# Patient Record
Sex: Female | Born: 1995 | Race: White | Hispanic: No | Marital: Married | Smoking: Never smoker
Health system: Southern US, Community
[De-identification: ages and names within clinical notes are randomized; demographics above are authoritative.]

## PROBLEM LIST (undated history)

## (undated) DIAGNOSIS — N920 Excessive and frequent menstruation with regular cycle: Secondary | ICD-10-CM

## (undated) DIAGNOSIS — N946 Dysmenorrhea, unspecified: Secondary | ICD-10-CM

## (undated) HISTORY — PX: NO PAST SURGERIES: SHX2092

## (undated) HISTORY — DX: Excessive and frequent menstruation with regular cycle: N92.0

## (undated) HISTORY — DX: Dysmenorrhea, unspecified: N94.6

---
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
## 2017-01-02 ENCOUNTER — Encounter: Payer: Self-pay | Admitting: Obstetrics and Gynecology

## 2017-01-02 ENCOUNTER — Ambulatory Visit (INDEPENDENT_AMBULATORY_CARE_PROVIDER_SITE_OTHER): Payer: BLUE CROSS/BLUE SHIELD | Admitting: Obstetrics and Gynecology

## 2017-01-02 VITALS — BP 111/73 | HR 80 | Ht 64.0 in | Wt 136.2 lb

## 2017-01-02 DIAGNOSIS — Z8742 Personal history of other diseases of the female genital tract: Secondary | ICD-10-CM | POA: Insufficient documentation

## 2017-01-02 DIAGNOSIS — Z3041 Encounter for surveillance of contraceptive pills: Secondary | ICD-10-CM

## 2017-01-02 DIAGNOSIS — Z842 Family history of other diseases of the genitourinary system: Secondary | ICD-10-CM

## 2017-01-02 DIAGNOSIS — N946 Dysmenorrhea, unspecified: Secondary | ICD-10-CM | POA: Insufficient documentation

## 2017-01-02 HISTORY — DX: Dysmenorrhea, unspecified: N94.6

## 2017-01-02 HISTORY — DX: Family history of other diseases of the genitourinary system: Z84.2

## 2017-01-02 HISTORY — DX: Personal history of other diseases of the female genital tract: Z87.42

## 2017-01-02 NOTE — Progress Notes (Signed)
Chief complaint: 1. Dysmenorrhea 2. Menorrhagia 3. Family history of endometriosis  The patient is an 21 year old female para 0, started on Sprintec for management of dysmenorrhea and menorrhagia, with improvement in symptomatology, presents in the company of her mom for follow-up on possible endometriosis.  Patient had sister recently diagnosed with significant endometriosis within the pelvis.  Gynecologic history: Menarche-age 21 Intervals-monthly Duration-7 days Flow-heavy Dysmenorrhea-severe 7/10; central cramps with backache and discomfort within buttocks without radiation; sitting, heating pad, and Pamprin help her symptoms. She has missed school because of her symptomatology. She has noted bowel symptoms prior to starting birth control pills perimenstrually including a defecation urge (which has resolved since starting birth control pills) Since starting birth control pills her dysmenorrhea has lessened.  Past Medical History:  Diagnosis Date  . Heavy periods   . Painful menstrual periods    Past Surgical History:  Procedure Laterality Date  . NO PAST SURGERIES      Review of systems: As noted in the history of present illness  OBJECTIVE: BP 111/73   Pulse 80   Ht 5\' 4"  (1.626 m)   Wt 136 lb 3.2 oz (61.8 kg)   LMP 12/04/2016 (Exact Date)   BMI 23.38 kg/m  Physical exam-deferred  ASSESSMENT: 1. Dysmenorrhea, improved with OCP therapy 2. Menorrhagia, improved with OCP therapy 3. Family history of endometriosis in sister  PLAN: 1. Endometriosis was discussed/explained and management options reviewed. Impact on fertility were addressed. Patient does understand that so long as she is being treated with birth control pills for endometriosis, her disease may not significantly progress. Impact on fertility is uncertain. However, there is no recommended therapeutic intervention to prevent infertility from endometriosis at this time other than treating with hormonal  contraceptives to suppress symptoms and already existing implants. 2. Patient may follow up with her pediatrician on a routine basis until she is dismissed from the pediatrics practice. She may follow up here on an as-needed basis or transition her care to [facility] as she desires.  A total of 15 minutes were spent face-to-face with the patient during this encounter and over half of that time dealt with counseling and coordination of care.  Herold HarmsMartin A Nalaysia Manganiello, MD  Note: This dictation was prepared with Dragon dictation along with smaller phrase technology. Any transcriptional errors that result from this process are unintentional.

## 2017-01-02 NOTE — Patient Instructions (Signed)
1. Continue taking birth control pills for cycle regulation, painful periods, and heavy periods 2. Continue using Pamprin or ibuprofen for cramps as needed 3. Return if pelvic pain symptoms increase 4. First Pap smear will be needed by age 21 5. May continue with annual physicals at [facility] until age 21 or transition here sooner if you desire   Endometriosis Endometriosis is a condition in which the tissue that lines the uterus (endometrium) grows outside of its normal location. The tissue may grow in many locations close to the uterus, but it commonly grows on the ovaries, fallopian tubes, vagina, or bowel. When the uterus sheds the endometrium every menstrual cycle, there is bleeding wherever the endometrial tissue is located. This can cause pain because blood is irritating to tissues that are not normally exposed to it. What are the causes? The cause of endometriosis is not known. What increases the risk? You may be more likely to develop endometriosis if you:  Have a family history of endometriosis.  Have never given birth.  Started your period at age 19 or younger.  Have high levels of estrogen in your body.  Were exposed to a certain medicine (diethylstilbestrol) before you were born (in utero).  Had low birth weight.  Were born as a twin, triplet, or other multiple.  Have a BMI of less than 25. BMI is an estimate of body fat and is calculated from height and weight. What are the signs or symptoms? Often, there are no symptoms of this condition. If you do have symptoms, they may:  Vary depending on where your endometrial tissue is growing.  Occur during your menstrual period (most common) or midcycle.  Come and go, or you may go months with no symptoms at all.  Stop with menopause. Symptoms may include:  Pain in the back or abdomen.  Heavier bleeding during periods.  Pain during sex.  Painful bowel movements.  Infertility.  Pelvic  pain.  Bleeding more than once a month. How is this diagnosed? This condition is diagnosed based on your symptoms and a physical exam. You may have tests, such as:  Blood tests and urine tests. These may be done to help rule out other possible causes of your symptoms.  Ultrasound, to look for abnormal tissues.  An X-ray of the lower bowel (barium enema).  An ultrasound that is done through the vagina (transvaginally).  CT scan.  MRI.  Laparoscopy. In this procedure, a lighted, pencil-sized instrument called a laparoscope is inserted into your abdomen through an incision. The laparoscope allows your health care provider to look at the organs inside your body and check for abnormal tissue to confirm the diagnosis. If abnormal tissue is found, your health care provider may remove a small piece of tissue (biopsy) to be examined under a microscope. How is this treated? Treatment for this condition may include:  Medicines to relieve pain, such as NSAIDs.  Hormone therapy. This involves using artificial (synthetic) hormones to reduce endometrial tissue growth. Your health care provider may recommend using a hormonal form of birth control, or other medicines.  Surgery. This may be done to remove abnormal endometrial tissue.  In some cases, tissue may be removed using a laparoscope and a laser (laparoscopic laser treatment).  In severe cases, surgery may be done to remove the fallopian tubes, uterus, and ovaries (hysterectomy). Follow these instructions at home:  Take over-the-counter and prescription medicines only as told by your health care provider.  Do not drive or use heavy  machinery while taking prescription pain medicine.  Try to avoid activities that cause pain, including sexual activity.  Keep all follow-up visits as told by your health care provider. This is important. Contact a health care provider if:  You have pain in the area between your hip bones (pelvic area) that  occurs:  Before, during, or after your period.  In between your period and gets worse during your period.  During or after sex.  With bowel movements or urination, especially during your period.  You have problems getting pregnant.  You have a fever. Get help right away if:  You have severe pain that does not get better with medicine.  You have severe nausea and vomiting, or you cannot eat without vomiting.  You have pain that affects only the lower, right side of your abdomen.  You have abdominal pain that gets worse.  You have abdominal swelling.  You have blood in your stool. This information is not intended to replace advice given to you by your health care provider. Make sure you discuss any questions you have with your health care provider. Document Released: 10/24/2000 Document Revised: 08/01/2016 Document Reviewed: 03/29/2016 Elsevier Interactive Patient Education  2017 [employer].

## 2017-06-03 DIAGNOSIS — N760 Acute vaginitis: Secondary | ICD-10-CM | POA: Diagnosis not present

## 2017-09-27 DIAGNOSIS — J069 Acute upper respiratory infection, unspecified: Secondary | ICD-10-CM | POA: Diagnosis not present

## 2017-10-20 ENCOUNTER — Ambulatory Visit (INDEPENDENT_AMBULATORY_CARE_PROVIDER_SITE_OTHER): Payer: BLUE CROSS/BLUE SHIELD | Admitting: Certified Nurse Midwife

## 2017-10-20 ENCOUNTER — Encounter: Payer: Self-pay | Admitting: Certified Nurse Midwife

## 2017-10-20 VITALS — BP 120/76 | HR 101 | Ht 63.0 in | Wt 157.4 lb

## 2017-10-20 DIAGNOSIS — Z3201 Encounter for pregnancy test, result positive: Secondary | ICD-10-CM

## 2017-10-20 DIAGNOSIS — R109 Unspecified abdominal pain: Secondary | ICD-10-CM | POA: Diagnosis not present

## 2017-10-20 DIAGNOSIS — N926 Irregular menstruation, unspecified: Secondary | ICD-10-CM

## 2017-10-20 DIAGNOSIS — O26891 Other specified pregnancy related conditions, first trimester: Secondary | ICD-10-CM

## 2017-10-20 NOTE — Progress Notes (Signed)
GYN ENCOUNTER NOTE  Subjective:       Bonnie Graham is a 21 y.o. 271P0000 female here for pregnancy confirmation.   Reports several positive home pregnancy tests and intermittent bilateral lower/abdominal pelvic pain. Episodes last 30-60 seconds are sharp in nature and then resolve without treatment. Endorses breast tenderness and nausea.   Denies difficulty breathing or respiratory distress, chest pain, abdominal pain, vaginal bleeding, dysuria, and leg pain or swelling.    Gynecologic History  Patient's last menstrual period was 09/13/2017 (exact date).   Gestational age: 73 weeks 2 days  Estimated date of birth: 06/20/2016  Contraception: none  Last Pap: N/A.   Obstetric History  OB History  Gravida Para Term Preterm AB Living  1 0 0 0 0 0  SAB TAB Ectopic Multiple Live Births  0 0 0 0 0    # Outcome Date GA Lbr Len/2nd Weight Sex Delivery Anes PTL Lv  1 Current               Past Medical History:  Diagnosis Date  . Heavy periods   . Painful menstrual periods     Past Surgical History:  Procedure Laterality Date  . NO PAST SURGERIES      Current Outpatient Medications on File Prior to Visit  Medication Sig Dispense Refill  . fluticasone (FLONASE) 50 MCG/ACT nasal spray Place into the nose.    . Prenatal Vit-Fe Fumarate-FA (PRENATAL MULTIVITAMIN) TABS tablet Take 1 tablet by mouth daily at 12 noon.     No current facility-administered medications on file prior to visit.     No Known Allergies  Social History   Socioeconomic History  . Marital status: In a relationship    Spouse name: Not on file  . Number of children: Not on file  . Years of education: Not on file  . Highest education level: Not on file  Social Needs  . Financial resource strain: Not on file  . Food insecurity - worry: Not on file  . Food insecurity - inability: Not on file  . Transportation needs - medical: Not on file  . Transportation needs - non-medical: Not on file   Occupational History  . Not on file  Tobacco Use  . Smoking status: Never Smoker  . Smokeless tobacco: Never Used  Substance and Sexual Activity  . Alcohol use: No  . Drug use: No  . Sexual activity: Yes    Birth control/protection: None  Other Topics Concern  . Not on file  Social History Narrative  . Not on file    Family History  Problem Relation Age of Onset  . Heart disease Father   . Endometriosis Sister   . Endometriosis Maternal Grandmother        grt  . Breast cancer Neg Hx   . Ovarian cancer Neg Hx   . Colon cancer Neg Hx   . Diabetes Neg Hx     The following portions of the patient's history were reviewed and updated as appropriate: allergies, current medications, past family history, past medical history, past social history, past surgical history and problem list.  Review of Systems  Review of Systems - Negative except as noted above.  History obtained from the patient.   Objective:   BP 120/76   Pulse (!) 101   Ht 5\' 3"  (1.6 m)   Wt 157 lb 7 oz (71.4 kg)   LMP 09/13/2017 (Exact Date)   BMI 27.89 kg/m   Alert and oriented  x 4, no apparent distress.   Positive UPT  Physical exam: not indicated  Assessment:   1. Missed menses  2. Pregnancy test performed, pregnancy confirmed  3. Abdominal pain during pregnancy in first trimester  Plan:   Discussed pregnancy safe foods and medications. Handouts given.   Reviewed red flag symptoms and when to call.   RTC x 2 weeks for dating/viability scan and nurse intake.   RTC x 5-7 weeks for NOB physical or sooner if needed.    [name], CNM [facility]

## 2017-10-20 NOTE — Patient Instructions (Signed)
Eating Plan for Pregnant Women While you are pregnant, your body will require additional nutrition to help support your growing baby. It is recommended that you consume:  150 additional calories each day during your first trimester.  300 additional calories each day during your second trimester.  300 additional calories each day during your third trimester.  Eating a healthy, well-balanced diet is very important for your health and for your baby's health. You also have a higher need for some vitamins and minerals, such as folic acid, calcium, iron, and vitamin D. What do I need to know about eating during pregnancy?  Do not try to lose weight or go on a diet during pregnancy.  Choose healthy, nutritious foods. Choose  of a sandwich with a glass of milk instead of a candy bar or a high-calorie sugar-sweetened beverage.  Limit your overall intake of foods that have "empty calories." These are foods that have little nutritional value, such as sweets, desserts, candies, sugar-sweetened beverages, and fried foods.  Eat a variety of foods, especially fruits and vegetables.  Take a prenatal vitamin to help meet the additional needs during pregnancy, specifically for folic acid, iron, calcium, and vitamin D.  Remember to stay active. Ask your health care provider for exercise recommendations that are specific to you.  Practice good food safety and cleanliness, such as washing your hands before you eat and after you prepare raw meat. This helps to prevent foodborne illnesses, such as listeriosis, that can be very dangerous for your baby. Ask your health care provider for more information about listeriosis. What does 150 extra calories look like? Healthy options for an additional 150 calories each day could be any of the following:  Plain low-fat yogurt (6-8 oz) with  cup of berries.  1 apple with 2 teaspoons of peanut butter.  Cut-up vegetables with  cup of hummus.  Low-fat chocolate milk  (8 oz or 1 cup).  1 string cheese with 1 medium orange.   of a peanut butter and jelly sandwich on whole-wheat bread (1 tsp of peanut butter).  For 300 calories, you could eat two of those healthy options each day. What is a healthy amount of weight to gain? The recommended amount of weight for you to gain is based on your pre-pregnancy BMI. If your pre-pregnancy BMI was:  Less than 18 (underweight), you should gain 28-40 lb.  18-24.9 (normal), you should gain 25-35 lb.  25-29.9 (overweight), you should gain 15-25 lb.  Greater than 30 (obese), you should gain 11-20 lb.  What if I am having twins or multiples? Generally, pregnant women who will be having twins or multiples may need to increase their daily calories by 300-600 calories each day. The recommended range for total weight gain is 25-54 lb, depending on your pre-pregnancy BMI. Talk with your health care provider for specific guidance about additional nutritional needs, weight gain, and exercise during your pregnancy. What foods can I eat? Grains Any grains. Try to choose whole grains, such as whole-wheat bread, oatmeal, or brown rice. Vegetables Any vegetables. Try to eat a variety of colors and types of vegetables to get a full range of vitamins and minerals. Remember to wash your vegetables well before eating. Fruits Any fruits. Try to eat a variety of colors and types of fruit to get a full range of vitamins and minerals. Remember to wash your fruits well before eating. Meats and Other Protein Sources Lean meats, including chicken, [place], fish, and lean cuts of beef, veal,  or pork. Make sure that all meats are cooked to "well done." Tofu. Tempeh. Beans. Eggs. Peanut butter and other nut butters. Seafood, such as shrimp, crab, and lobster. If you choose fish, select types that are higher in omega-3 fatty acids, including salmon, herring, mussels, trout, sardines, and pollock. Make sure that all meats are cooked to food-safe  temperatures. Dairy Pasteurized milk and milk alternatives. Pasteurized yogurt and pasteurized cheese. Cottage cheese. Sour cream. Beverages Water. Juices that contain 100% fruit juice or vegetable juice. Caffeine-free teas and decaffeinated coffee. Drinks that contain caffeine are okay to drink, but it is better to avoid caffeine. Keep your total caffeine intake to less than 200 mg each day (12 oz of coffee, tea, or soda) or as directed by your health care provider. Condiments Any pasteurized condiments. Sweets and Desserts Any sweets and desserts. Fats and Oils Any fats and oils. The items listed above may not be a complete list of recommended foods or beverages. Contact your dietitian for more options. What foods are not recommended? Vegetables Unpasteurized (raw) vegetable juices. Fruits Unpasteurized (raw) fruit juices. Meats and Other Protein Sources Cured meats that have nitrates, such as bacon, salami, and hotdogs. Luncheon meats, bologna, or other deli meats (unless they are reheated until they are steaming hot). Refrigerated pate, meat spreads from a meat counter, smoked seafood that is found in the refrigerated section of a store. Raw fish, such as sushi or sashimi. High mercury content fish, such as tilefish, shark, swordfish, and king mackerel. Raw meats, such as tuna or beef tartare. Undercooked meats and poultry. Make sure that all meats are cooked to food-safe temperatures. Dairy Unpasteurized (raw) milk and any foods that have raw milk in them. Soft cheeses, such as feta, queso blanco, queso fresco, Brie, Camembert cheeses, blue-veined cheeses, and Panela cheese (unless it is made with pasteurized milk, which must be stated on the label). Beverages Alcohol. Sugar-sweetened beverages, such as sodas, teas, or energy drinks. Condiments Homemade fermented foods and drinks, such as pickles, sauerkraut, or kombucha drinks. (Store-bought pasteurized versions of these are  okay.) Other Salads that are made in the store, such as ham salad, chicken salad, egg salad, tuna salad, and seafood salad. The items listed above may not be a complete list of foods and beverages to avoid. Contact your dietitian for more information. This information is not intended to replace advice given to you by your health care provider. Make sure you discuss any questions you have with your health care provider. Document Released: 08/11/2014 Document Revised: 04/03/2016 Document Reviewed: 04/11/2014 Elsevier Interactive Patient Education  2018 [employer]. WHAT OB PATIENTS CAN EXPECT   Confirmation of pregnancy and ultrasound ordered if medically indicated-[redacted] weeks gestation  New OB (NOB) intake with nurse and New OB (NOB) labs- [redacted] weeks gestation  New OB (NOB) physical examination with provider- 11/[redacted] weeks gestation  Flu vaccine-[redacted] weeks gestation  Anatomy scan-[redacted] weeks gestation  Glucose tolerance test, blood work to test for anemia, T-dap vaccine-[redacted] weeks gestation  Vaginal swabs/cultures-STD/Group B strep-[redacted] weeks gestation  Appointments every 4 weeks until 28 weeks  Every 2 weeks from 28 weeks until 36 weeks  Weekly visits from 36 weeks until delivery  Common Medications Safe in Pregnancy  Acne:      Constipation:  Benzoyl Peroxide     Colace  Clindamycin      Dulcolax Suppository  Topica Erythromycin     Fibercon  Salicylic Acid      Metamucil  Miralax AVOID:        Senakot   Accutane    Cough:  Retin-A       Cough Drops  Tetracycline      Phenergan w/ Codeine if Rx  Minocycline      Robitussin (Plain & DM)  Antibiotics:     Crabs/Lice:  Ceclor       RID  Cephalosporins    AVOID:  E-Mycins      Kwell  Keflex  Macrobid/Macrodantin   Diarrhea:  Penicillin      Kao-Pectate  Zithromax      Imodium AD         PUSH FLUIDS AVOID:       Cipro     Fever:  Tetracycline      Tylenol (Regular or Extra  Minocycline       Strength)  Levaquin      Extra  Strength-Do not          Exceed 8 tabs/24 hrs Caffeine:        <274m/day (equiv. To 1 cup of coffee or  approx. 3 12 oz sodas)         Gas: Cold/Hayfever:       Gas-X  Benadryl      Mylicon  Claritin       Phazyme  **Claritin-D        Chlor-Trimeton    Headaches:  Dimetapp      ASA-Free Excedrin  Drixoral-Non-Drowsy     Cold Compress  Mucinex (Guaifenasin)     Tylenol (Regular or Extra  Sudafed/Sudafed-12 Hour     Strength)  **Sudafed PE Pseudoephedrine   Tylenol Cold & Sinus     Vicks Vapor Rub  Zyrtec  **AVOID if Problems With Blood Pressure         Heartburn: Avoid lying down for at least 1 hour after meals  Aciphex      Maalox     Rash:  Milk of Magnesia     Benadryl    Mylanta       1% Hydrocortisone Cream  Pepcid  Pepcid Complete   Sleep Aids:  Prevacid      Ambien   Prilosec       Benadryl  Rolaids       Chamomile Tea  Tums (Limit 4/day)     Unisom  Zantac       Tylenol PM         Warm milk-add vanilla or  Hemorrhoids:       Sugar for taste  Anusol/Anusol H.C.  (RX: Analapram 2.5%)  Sugar Substitutes:  Hydrocortisone OTC     Ok in moderation  Preparation H      Tucks        Vaseline lotion applied to tissue with wiping    Herpes:     Throat:  Acyclovir      Oragel  Famvir  Valtrex     Vaccines:         Flu Shot Leg Cramps:       *Gardasil  Benadryl      Hepatitis A         Hepatitis B Nasal Spray:       Pneumovax  Saline Nasal Spray     Polio Booster         Tetanus Nausea:       Tuberculosis test or PPD  Vitamin B6 25 mg TID   AVOID:    Dramamine      *  Gardasil  Emetrol       Live Poliovirus  Ginger Root 250 mg QID    MMR (measles, mumps &  High Complex Carbs @ Bedtime    rebella)  Sea Bands-Accupressure    Varicella (Chickenpox)  Unisom 1/2 tab TID     *No known complications           If received before Pain:         Known pregnancy;   Darvocet       Resume series  after  Lortab        Delivery  Percocet    Yeast:   Tramadol      Femstat  Tylenol 3      Gyne-lotrimin  Ultram       Monistat  Vicodin           MISC:         All Sunscreens           Hair Coloring/highlights          Insect Repellant's          (Including DEET)         Mystic Tans Abdominal Pain During Pregnancy Belly (abdominal) pain is common during pregnancy. Most of the time, it is not a serious problem. Other times, it can be a sign that something is wrong with the pregnancy. Always tell your doctor if you have belly pain. Follow these instructions at home: Monitor your belly pain for any changes. The following actions may help you feel better:  Do not have sex (intercourse) or put anything in your vagina until you feel better.  Rest until your pain stops.  Drink clear fluids if you feel sick to your stomach (nauseous). Do not eat solid food until you feel better.  Only take medicine as told by your doctor.  Keep all doctor visits as told.  Get help right away if:  You are bleeding, leaking fluid, or pieces of tissue come out of your vagina.  You have more pain or cramping.  You keep throwing up (vomiting).  You have pain when you pee (urinate) or have blood in your pee.  You have a fever.  You do not feel your baby moving as much.  You feel very weak or feel like passing out.  You have trouble breathing, with or without belly pain.  You have a very bad headache and belly pain.  You have fluid leaking from your vagina and belly pain.  You keep having watery poop (diarrhea).  Your belly pain does not go away after resting, or the pain gets worse. This information is not intended to replace advice given to you by your health care provider. Make sure you discuss any questions you have with your health care provider. Document Released: 10/15/2009 Document Revised: 06/04/2016 Document Reviewed: 05/26/2013 Elsevier Interactive Patient Education  2018 [place] of Pregnancy The first trimester of pregnancy is from week 1 until the end of week 13 (months 1 through 3). During this time, your baby will begin to develop inside you. At 6-8 weeks, the eyes and face are formed, and the heartbeat can be seen on ultrasound. At the end of 12 weeks, all the baby's organs are formed. Prenatal care is all the medical care you receive before the birth of your baby. Make sure you get good prenatal care and follow all of your doctor's instructions. Follow these instructions at home: Medicines  Take over-the-counter and prescription  medicines only as told by your doctor. Some medicines are safe and some medicines are not safe during pregnancy.  Take a prenatal vitamin that contains at least 600 micrograms (mcg) of folic acid.  If you have trouble pooping (constipation), take medicine that will make your stool soft (stool softener) if your doctor approves. Eating and drinking  Eat regular, healthy meals.  Your doctor will tell you the amount of weight gain that is right for you.  Avoid raw meat and uncooked cheese.  If you feel sick to your stomach (nauseous) or throw up (vomit): ? Eat 4 or 5 small meals a day instead of 3 large meals. ? Try eating a few soda crackers. ? Drink liquids between meals instead of during meals.  To prevent constipation: ? Eat foods that are high in fiber, like fresh fruits and vegetables, whole grains, and beans. ? Drink enough fluids to keep your pee (urine) clear or pale yellow. Activity  Exercise only as told by your doctor. Stop exercising if you have cramps or pain in your lower belly (abdomen) or low back.  Do not exercise if it is too hot, too humid, or if you are in a place of great height (high altitude).  Try to avoid standing for long periods of time. Move your legs often if you must stand in one place for a long time.  Avoid heavy lifting.  Wear low-heeled shoes. Sit and stand up  straight.  You can have sex unless your doctor tells you not to. Relieving pain and discomfort  Wear a good support bra if your breasts are sore.  Take warm water baths (sitz baths) to soothe pain or discomfort caused by hemorrhoids. Use hemorrhoid cream if your doctor says it is okay.  Rest with your legs raised if you have leg cramps or low back pain.  If you have puffy, bulging veins (varicose veins) in your legs: ? Wear support hose or compression stockings as told by your doctor. ? Raise (elevate) your feet for 15 minutes, 3-4 times a day. ? Limit salt in your food. Prenatal care  Schedule your prenatal visits by the twelfth week of pregnancy.  Write down your questions. Take them to your prenatal visits.  Keep all your prenatal visits as told by your doctor. This is important. Safety  Wear your seat belt at all times when driving.  Make a list of emergency phone numbers. The list should include numbers for family, friends, the hospital, and police and fire departments. General instructions  Ask your doctor for a referral to a local prenatal class. Begin classes no later than at the start of month 6 of your pregnancy.  Ask for help if you need counseling or if you need help with nutrition. Your doctor can give you advice or tell you where to go for help.  Do not use hot tubs, steam rooms, or saunas.  Do not douche or use tampons or scented sanitary pads.  Do not cross your legs for long periods of time.  Avoid all herbs and alcohol. Avoid drugs that are not approved by your doctor.  Do not use any tobacco products, including cigarettes, chewing tobacco, and electronic cigarettes. If you need help quitting, ask your doctor. You may get counseling or other support to help you quit.  Avoid cat litter boxes and soil used by cats. These carry germs that can cause birth defects in the baby and can cause a loss of your baby (miscarriage) or stillbirth.  Visit your dentist.  At home, brush your teeth with a soft toothbrush. Be gentle when you floss. Contact a doctor if:  You are dizzy.  You have mild cramps or pressure in your lower belly.  You have a nagging pain in your belly area.  You continue to feel sick to your stomach, you throw up, or you have watery poop (diarrhea).  You have a bad smelling fluid coming from your vagina.  You have pain when you pee (urinate).  You have increased puffiness (swelling) in your face, hands, legs, or ankles. Get help right away if:  You have a fever.  You are leaking fluid from your vagina.  You have spotting or bleeding from your vagina.  You have very bad belly cramping or pain.  You gain or lose weight rapidly.  You throw up blood. It may look like coffee grounds.  You are around people who have [place] measles, fifth disease, or chickenpox.  You have a very bad headache.  You have shortness of breath.  You have any kind of trauma, such as from a fall or a car accident. Summary  The first trimester of pregnancy is from week 1 until the end of week 13 (months 1 through 3).  To take care of yourself and your unborn baby, you will need to eat healthy meals, take medicines only if your doctor tells you to do so, and do activities that are safe for you and your baby.  Keep all follow-up visits as told by your doctor. This is important as your doctor will have to ensure that your baby is healthy and growing well. This information is not intended to replace advice given to you by your health care provider. Make sure you discuss any questions you have with your health care provider. Document Released: 04/14/2008 Document Revised: 11/04/2016 Document Reviewed: 11/04/2016 Elsevier Interactive Patient Education  2017 [employer].

## 2017-10-20 NOTE — Progress Notes (Signed)
Pt is here for a confirmation of pregnancy. C/o sharp pain both sides of lower abdomen. Denies bleeding.

## 2017-10-23 ENCOUNTER — Other Ambulatory Visit: Payer: Self-pay | Admitting: Certified Nurse Midwife

## 2017-10-23 DIAGNOSIS — Z3201 Encounter for pregnancy test, result positive: Secondary | ICD-10-CM

## 2017-10-23 DIAGNOSIS — N926 Irregular menstruation, unspecified: Secondary | ICD-10-CM

## 2017-11-02 ENCOUNTER — Ambulatory Visit (INDEPENDENT_AMBULATORY_CARE_PROVIDER_SITE_OTHER): Payer: BLUE CROSS/BLUE SHIELD

## 2017-11-02 DIAGNOSIS — N926 Irregular menstruation, unspecified: Secondary | ICD-10-CM | POA: Diagnosis not present

## 2017-11-02 DIAGNOSIS — Z3201 Encounter for pregnancy test, result positive: Secondary | ICD-10-CM

## 2017-11-06 ENCOUNTER — Ambulatory Visit: Payer: BLUE CROSS/BLUE SHIELD | Admitting: Certified Nurse Midwife

## 2017-11-06 VITALS — BP 118/78 | HR 88 | Ht 63.0 in | Wt 159.5 lb

## 2017-11-06 DIAGNOSIS — Z3490 Encounter for supervision of normal pregnancy, unspecified, unspecified trimester: Secondary | ICD-10-CM | POA: Diagnosis not present

## 2017-11-06 NOTE — Patient Instructions (Signed)
First Trimester of Pregnancy The first trimester of pregnancy is from week 1 until the end of week 13 (months 1 through 3). During this time, your baby will begin to develop inside you. At 6-8 weeks, the eyes and face are formed, and the heartbeat can be seen on ultrasound. At the end of 12 weeks, all the baby's organs are formed. Prenatal care is all the medical care you receive before the birth of your baby. Make sure you get good prenatal care and follow all of your doctor's instructions. Follow these instructions at home: Medicines  Take over-the-counter and prescription medicines only as told by your doctor. Some medicines are safe and some medicines are not safe during pregnancy.  Take a prenatal vitamin that contains at least 600 micrograms (mcg) of folic acid.  If you have trouble pooping (constipation), take medicine that will make your stool soft (stool softener) if your doctor approves. Eating and drinking  Eat regular, healthy meals.  Your doctor will tell you the amount of weight gain that is right for you.  Avoid raw meat and uncooked cheese.  If you feel sick to your stomach (nauseous) or throw up (vomit): ? Eat 4 or 5 small meals a day instead of 3 large meals. ? Try eating a few soda crackers. ? Drink liquids between meals instead of during meals.  To prevent constipation: ? Eat foods that are high in fiber, like fresh fruits and vegetables, whole grains, and beans. ? Drink enough fluids to keep your pee (urine) clear or pale yellow. Activity  Exercise only as told by your doctor. Stop exercising if you have cramps or pain in your lower belly (abdomen) or low back.  Do not exercise if it is too hot, too humid, or if you are in a place of great height (high altitude).  Try to avoid standing for long periods of time. Move your legs often if you must stand in one place for a long time.  Avoid heavy lifting.  Wear low-heeled shoes. Sit and stand up straight.  You  can have sex unless your doctor tells you not to. Relieving pain and discomfort  Wear a good support bra if your breasts are sore.  Take warm water baths (sitz baths) to soothe pain or discomfort caused by hemorrhoids. Use hemorrhoid cream if your doctor says it is okay.  Rest with your legs raised if you have leg cramps or low back pain.  If you have puffy, bulging veins (varicose veins) in your legs: ? Wear support hose or compression stockings as told by your doctor. ? Raise (elevate) your feet for 15 minutes, 3-4 times a day. ? Limit salt in your food. Prenatal care  Schedule your prenatal visits by the twelfth week of pregnancy.  Write down your questions. Take them to your prenatal visits.  Keep all your prenatal visits as told by your doctor. This is important. Safety  Wear your seat belt at all times when driving.  Make a list of emergency phone numbers. The list should include numbers for family, friends, the hospital, and police and fire departments. General instructions  Ask your doctor for a referral to a local prenatal class. Begin classes no later than at the start of month 6 of your pregnancy.  Ask for help if you need counseling or if you need help with nutrition. Your doctor can give you advice or tell you where to go for help.  Do not use hot tubs, steam rooms, or   saunas.  Do not douche or use tampons or scented sanitary pads.  Do not cross your legs for long periods of time.  Avoid all herbs and alcohol. Avoid drugs that are not approved by your doctor.  Do not use any tobacco products, including cigarettes, chewing tobacco, and electronic cigarettes. If you need help quitting, ask your doctor. You may get counseling or other support to help you quit.  Avoid cat litter boxes and soil used by cats. These carry germs that can cause birth defects in the baby and can cause a loss of your baby (miscarriage) or stillbirth.  Visit your dentist. At home, brush  your teeth with a soft toothbrush. Be gentle when you floss. Contact a doctor if:  You are dizzy.  You have mild cramps or pressure in your lower belly.  You have a nagging pain in your belly area.  You continue to feel sick to your stomach, you throw up, or you have watery poop (diarrhea).  You have a bad smelling fluid coming from your vagina.  You have pain when you pee (urinate).  You have increased puffiness (swelling) in your face, hands, legs, or ankles. Get help right away if:  You have a fever.  You are leaking fluid from your vagina.  You have spotting or bleeding from your vagina.  You have very bad belly cramping or pain.  You gain or lose weight rapidly.  You throw up blood. It may look like coffee grounds.  You are around people who have [place] measles, fifth disease, or chickenpox.  You have a very bad headache.  You have shortness of breath.  You have any kind of trauma, such as from a fall or a car accident. Summary  The first trimester of pregnancy is from week 1 until the end of week 13 (months 1 through 3).  To take care of yourself and your unborn baby, you will need to eat healthy meals, take medicines only if your doctor tells you to do so, and do activities that are safe for you and your baby.  Keep all follow-up visits as told by your doctor. This is important as your doctor will have to ensure that your baby is healthy and growing well. This information is not intended to replace advice given to you by your health care provider. Make sure you discuss any questions you have with your health care provider. Document Released: 04/14/2008 Document Revised: 11/04/2016 Document Reviewed: 11/04/2016 Elsevier Interactive Patient Education  2017 [employer]  

## 2017-11-06 NOTE — Progress Notes (Signed)
Bonnie Graham presents for NOB nurse interview visit. Pregnancy confirmation done [facility]____.  G- 1.  P-    . Pregnancy education material explained and given. _No__ cats in the home. NOB labs ordered. (TSH/HbgA1c due to Increased BMI), . HIV labs and Drug screen were explained optional and she did not decline. Drug screen ordered/declined. PNV encouraged. Genetic screening options discussed. Will discuss with provider . Pt. To follow up with provider in _2_ weeks for NOB physical.  All questions answered.

## 2017-11-07 LAB — CBC WITH DIFFERENTIAL/PLATELET
BASOS ABS: 0 10*3/uL (ref 0.0–0.2)
Basos: 0 %
EOS (ABSOLUTE): 0.4 10*3/uL (ref 0.0–0.4)
Eos: 3 %
HEMATOCRIT: 37.3 % (ref 34.0–46.6)
HEMOGLOBIN: 12.5 g/dL (ref 11.1–15.9)
Immature Grans (Abs): 0.1 10*3/uL (ref 0.0–0.1)
Immature Granulocytes: 1 %
LYMPHS ABS: 2.1 10*3/uL (ref 0.7–3.1)
Lymphs: 16 %
MCH: 28.5 pg (ref 26.6–33.0)
MCHC: 33.5 g/dL (ref 31.5–35.7)
MCV: 85 fL (ref 79–97)
MONOCYTES: 6 %
MONOS ABS: 0.8 10*3/uL (ref 0.1–0.9)
NEUTROS ABS: 9.9 10*3/uL — AB (ref 1.4–7.0)
Neutrophils: 74 %
Platelets: 350 10*3/uL (ref 150–379)
RBC: 4.38 x10E6/uL (ref 3.77–5.28)
RDW: 13.8 % (ref 12.3–15.4)
WBC: 13.2 10*3/uL — AB (ref 3.4–10.8)

## 2017-11-07 LAB — URINALYSIS, ROUTINE W REFLEX MICROSCOPIC
Bilirubin, UA: NEGATIVE
GLUCOSE, UA: NEGATIVE
LEUKOCYTES UA: NEGATIVE
NITRITE UA: NEGATIVE
PROTEIN UA: NEGATIVE
RBC, UA: NEGATIVE
SPEC GRAV UA: 1.029 (ref 1.005–1.030)
Urobilinogen, Ur: 0.2 mg/dL (ref 0.2–1.0)
pH, UA: 6.5 (ref 5.0–7.5)

## 2017-11-07 LAB — GC/CHLAMYDIA PROBE AMP
CHLAMYDIA, DNA PROBE: NEGATIVE
Neisseria gonorrhoeae by PCR: NEGATIVE

## 2017-11-07 LAB — HIV ANTIBODY (ROUTINE TESTING W REFLEX): HIV Screen 4th Generation wRfx: NONREACTIVE

## 2017-11-07 LAB — ANTIBODY SCREEN: ANTIBODY SCREEN: NEGATIVE

## 2017-11-07 LAB — ABO AND RH: RH TYPE: POSITIVE

## 2017-11-07 LAB — RPR: RPR Ser Ql: NONREACTIVE

## 2017-11-07 LAB — HEPATITIS B SURFACE ANTIGEN: HEP B S AG: NEGATIVE

## 2017-11-07 LAB — RUBELLA SCREEN: RUBELLA: 1.02 {index} (ref >0.99)

## 2017-11-07 LAB — VARICELLA ZOSTER ANTIBODY, IGG: Varicella zoster IgG: <135 index — ABNORMAL LOW (ref >165)

## 2017-11-08 LAB — URINE CULTURE: Organism ID, Bacteria: NO GROWTH

## 2017-11-09 ENCOUNTER — Encounter: Payer: Self-pay | Admitting: Certified Nurse Midwife

## 2017-11-10 NOTE — L&D Delivery Note (Signed)
Delivery Note  0128 In room to assess patient, reports pelvic pressure and the urge to push. SVE: 10/100/+3, vertex, BBOW  Effective maternal pushing efforts noted. Spontaneous rupture of membranes on perineum. Spontaneous vaginal delivery of liveborn female via occiput posterior position at 0151. Single body cord loose and reduced after birth. Infant to maternal abdomen. Cord clamped and baby to NNP and receiving nurse for further evaluation. AGPARs: 4, 8. Weight: 3 pounds 12 ounces.   Spontaneous delivery of placenta immediately following birth of infant. Clots and large gush of blood noted. Pitocin infusing. Bimanual massage with firm uterine tone noted. First degree perineal and right labial laceration repaired with 3-0 vicryl rapide under epidural anesthesia. QBL: 660 ml. Lochia: small. Vault check completed. Counts correct x 2.   Initiate routine postpartum care and orders. Mom to postpartum.  Baby to NICU.    Bonnie Graham, CNM [facility], [facility] 05/08/2018, 2:15 AM

## 2017-11-11 ENCOUNTER — Other Ambulatory Visit: Payer: Self-pay | Admitting: Obstetrics and Gynecology

## 2017-11-11 DIAGNOSIS — Z2839 Other underimmunization status: Secondary | ICD-10-CM | POA: Insufficient documentation

## 2017-11-11 DIAGNOSIS — Z283 Underimmunization status: Principal | ICD-10-CM

## 2017-11-11 DIAGNOSIS — O09899 Supervision of other high risk pregnancies, unspecified trimester: Secondary | ICD-10-CM | POA: Insufficient documentation

## 2017-11-11 LAB — MONITOR DRUG PROFILE 14(MW)
Amphetamine Scrn, Ur: NEGATIVE ng/mL
BARBITURATE SCREEN URINE: NEGATIVE ng/mL
BENZODIAZEPINE SCREEN, URINE: NEGATIVE ng/mL
CANNABINOIDS UR QL SCN: NEGATIVE ng/mL
COCAINE(METAB.)SCREEN, URINE: NEGATIVE ng/mL
CREATININE(CRT), U: 190 mg/dL (ref 20.0–300.0)
Fentanyl, Urine: NEGATIVE pg/mL
MEPERIDINE SCREEN, URINE: NEGATIVE ng/mL
METHADONE SCREEN, URINE: NEGATIVE ng/mL
OPIATE SCREEN URINE: NEGATIVE ng/mL
OXYCODONE+OXYMORPHONE UR QL SCN: NEGATIVE ng/mL
PHENCYCLIDINE QUANTITATIVE URINE: NEGATIVE ng/mL
Ph of Urine: 6.3 (ref 4.5–8.9)
Propoxyphene Scrn, Ur: NEGATIVE ng/mL
SPECIFIC GRAVITY: 1.033
Tramadol Screen, Urine: NEGATIVE ng/mL

## 2017-11-11 LAB — BUPRENORPHINE CONFIRM, URINE: BUPRENORPHINE: NEGATIVE

## 2017-11-23 ENCOUNTER — Ambulatory Visit (INDEPENDENT_AMBULATORY_CARE_PROVIDER_SITE_OTHER): Payer: BLUE CROSS/BLUE SHIELD | Admitting: Certified Nurse Midwife

## 2017-11-23 VITALS — BP 106/76 | HR 72 | Wt 151.0 lb

## 2017-11-23 DIAGNOSIS — Z3A09 9 weeks gestation of pregnancy: Secondary | ICD-10-CM

## 2017-11-23 LAB — POCT URINALYSIS DIPSTICK
Bilirubin, UA: NEGATIVE
Glucose, UA: NEGATIVE
Ketones, UA: NEGATIVE
LEUKOCYTES UA: NEGATIVE
NITRITE UA: NEGATIVE
PH UA: 6.5 (ref 5.0–8.0)
PROTEIN UA: NEGATIVE
RBC UA: NEGATIVE
Spec Grav, UA: 1.015 (ref 1.010–1.025)
UROBILINOGEN UA: 0.2 U/dL

## 2017-11-23 NOTE — Progress Notes (Signed)
I have reviewed the record and concur with patient management and plan of care.    Jordell Outten Michelle Deoni Cosey, CNM [facility], [facility] 

## 2017-11-23 NOTE — Patient Instructions (Addendum)
Back Pain in Pregnancy Back pain during pregnancy is common. Back pain may be caused by several factors that are related to changes during your pregnancy. Follow these instructions at home: Managing pain, stiffness, and swelling  If directed, apply ice for sudden (acute) back pain. ? Put ice in a plastic bag. ? Place a towel between your skin and the bag. ? Leave the ice on for 20 minutes, 2-3 times per day.  If directed, apply heat to the affected area before you exercise: ? Place a towel between your skin and the heat pack or heating pad. ? Leave the heat on for 20-30 minutes. ? Remove the heat if your skin turns bright red. This is especially important if you are unable to feel pain, heat, or cold. You may have a greater risk of getting burned. Activity  Exercise as told by your health care provider. Exercising is the best way to prevent or manage back pain.  Listen to your body when lifting. If lifting hurts, ask for help or bend your knees. This uses your leg muscles instead of your back muscles.  Squat down when picking up something from the floor. Do not bend over.  Only use bed rest as told by your health care provider. Bed rest should only be used for the most severe episodes of back pain. Standing, Sitting, and Lying Down  Do not stand in one place for long periods of time.  Use good posture when sitting. Make sure your head rests over your shoulders and is not hanging forward. Use a pillow on your lower back if necessary.  Try sleeping on your side, preferably the left side, with a pillow or two between your legs. If you are sore after a night's rest, your bed may be too soft. A firm mattress may provide more support for your back during pregnancy. General instructions  Do not wear high heels.  Eat a healthy diet. Try to gain weight within your health care provider's recommendations.  Use a maternity girdle, elastic sling, or back brace as told by your health care  provider.  Take over-the-counter and prescription medicines only as told by your health care provider.  Keep all follow-up visits as told by your health care provider. This is important. This includes any visits with any specialists, such as a physical therapist. Contact a health care provider if:  Your back pain interferes with your daily activities.  You have increasing pain in other parts of your body. Get help right away if:  You develop numbness, tingling, weakness, or problems with the use of your arms or legs.  You develop severe back pain that is not controlled with medicine.  You have a sudden change in bowel or bladder control.  You develop shortness of breath, dizziness, or you faint.  You develop nausea, vomiting, or sweating.  You have back pain that is a rhythmic, cramping pain similar to labor pains. Labor pain is usually 1-2 minutes apart, lasts for about 1 minute, and involves a bearing down feeling or pressure in your pelvis.  You have back pain and your water breaks or you have vaginal bleeding.  You have back pain or numbness that travels down your leg.  Your back pain developed after you fell.  You develop pain on one side of your back.  You see blood in your urine.  You develop skin blisters in the area of your back pain. This information is not intended to replace advice given to you   by your health care provider. Make sure you discuss any questions you have with your health care provider. Document Released: 02/04/2006 Document Revised: 04/03/2016 Document Reviewed: 07/11/2015 Elsevier Interactive Patient Education  2018 [employer]. Vaginal Bleeding During Pregnancy, First Trimester A small amount of bleeding (spotting) from the vagina is common in early pregnancy. Sometimes the bleeding is normal and is not a problem, and sometimes it is a sign of something serious. Be sure to tell your doctor about any bleeding from your vagina right away. Follow  these instructions at home:  Watch your condition for any changes.  Follow your doctor's instructions about how active you can be.  If you are on bed rest: ? You may need to stay in bed and only get up to use the bathroom. ? You may be allowed to do some activities. ? If you need help, make plans for someone to help you.  Write down: ? The number of pads you use each day. ? How often you change pads. ? How soaked (saturated) your pads are.  Do not use tampons.  Do not douche.  Do not have sex or orgasms until your doctor says it is okay.  If you pass any tissue from your vagina, save the tissue so you can show it to your doctor.  Only take medicines as told by your doctor.  Do not take aspirin because it can make you bleed.  Keep all follow-up visits as told by your doctor. Contact a doctor if:  You bleed from your vagina.  You have cramps.  You have labor pains.  You have a fever that does not go away after you take medicine. Get help right away if:  You have very bad cramps in your back or belly (abdomen).  You pass large clots or tissue from your vagina.  You bleed more.  You feel light-headed or weak.  You pass out (faint).  You have chills.  You are leaking fluid or have a gush of fluid from your vagina.  You pass out while pooping (having a bowel movement). This information is not intended to replace advice given to you by your health care provider. Make sure you discuss any questions you have with your health care provider. Document Released: 03/13/2014 Document Revised: 04/03/2016 Document Reviewed: 07/04/2013 Elsevier Interactive Patient Education  2018 [employer]. Morning Sickness Morning sickness is when you feel sick to your stomach (nauseous) during pregnancy. You may feel sick to your stomach and throw up (vomit). You may feel sick in the morning, but you can feel this way any time of day. Some women feel very sick to their stomach and  cannot stop throwing up (hyperemesis gravidarum). Follow these instructions at home:  Only take medicines as told by your doctor.  Take multivitamins as told by your doctor. Taking multivitamins before getting pregnant can stop or lessen the harshness of morning sickness.  Eat dry toast or unsalted crackers before getting out of bed.  Eat 5 to 6 small meals a day.  Eat dry and bland foods like rice and baked potatoes.  Do not drink liquids with meals. Drink between meals.  Do not eat greasy, fatty, or spicy foods.  Have someone cook for you if the smell of food causes you to feel sick or throw up.  If you feel sick to your stomach after taking prenatal vitamins, take them at night or with a snack.  Eat protein when you need a snack (nuts, yogurt, cheese).  Eat unsweetened gelatins for dessert.  Wear a bracelet used for sea sickness (acupressure wristband).  Go to a doctor that puts thin needles into certain body points (acupuncture) to improve how you feel.  Do not smoke.  Use a humidifier to keep the air in your house free of odors.  Get lots of fresh air. Contact a doctor if:  You need medicine to feel better.  You feel dizzy or lightheaded.  You are losing weight. Get help right away if:  You feel very sick to your stomach and cannot stop throwing up.  You pass out (faint). This information is not intended to replace advice given to you by your health care provider. Make sure you discuss any questions you have with your health care provider. Document Released: 12/04/2004 Document Revised: 04/03/2016 Document Reviewed: 04/13/2013 Elsevier Interactive Patient Education  2017 [employer] for Pregnant Women While you are pregnant, your body will require additional nutrition to help support your growing baby. It is recommended that you consume:  150 additional calories each day during your first trimester.  300 additional calories each day during  your second trimester.  300 additional calories each day during your third trimester.  Eating a healthy, well-balanced diet is very important for your health and for your baby's health. You also have a higher need for some vitamins and minerals, such as folic acid, calcium, iron, and vitamin D. What do I need to know about eating during pregnancy?  Do not try to lose weight or go on a diet during pregnancy.  Choose healthy, nutritious foods. Choose  of a sandwich with a glass of milk instead of a candy bar or a high-calorie sugar-sweetened beverage.  Limit your overall intake of foods that have "empty calories." These are foods that have little nutritional value, such as sweets, desserts, candies, sugar-sweetened beverages, and fried foods.  Eat a variety of foods, especially fruits and vegetables.  Take a prenatal vitamin to help meet the additional needs during pregnancy, specifically for folic acid, iron, calcium, and vitamin D.  Remember to stay active. Ask your health care provider for exercise recommendations that are specific to you.  Practice good food safety and cleanliness, such as washing your hands before you eat and after you prepare raw meat. This helps to prevent foodborne illnesses, such as listeriosis, that can be very dangerous for your baby. Ask your health care provider for more information about listeriosis. What does 150 extra calories look like? Healthy options for an additional 150 calories each day could be any of the following:  Plain low-fat yogurt (6-8 oz) with  cup of berries.  1 apple with 2 teaspoons of peanut butter.  Cut-up vegetables with  cup of hummus.  Low-fat chocolate milk (8 oz or 1 cup).  1 string cheese with 1 medium orange.   of a peanut butter and jelly sandwich on whole-wheat bread (1 tsp of peanut butter).  For 300 calories, you could eat two of those healthy options each day. What is a healthy amount of weight to gain? The  recommended amount of weight for you to gain is based on your pre-pregnancy BMI. If your pre-pregnancy BMI was:  Less than 18 (underweight), you should gain 28-40 lb.  18-24.9 (normal), you should gain 25-35 lb.  25-29.9 (overweight), you should gain 15-25 lb.  Greater than 30 (obese), you should gain 11-20 lb.  What if I am having twins or multiples? Generally, pregnant women who will be having  twins or multiples may need to increase their daily calories by 300-600 calories each day. The recommended range for total weight gain is 25-54 lb, depending on your pre-pregnancy BMI. Talk with your health care provider for specific guidance about additional nutritional needs, weight gain, and exercise during your pregnancy. What foods can I eat? Grains Any grains. Try to choose whole grains, such as whole-wheat bread, oatmeal, or brown rice. Vegetables Any vegetables. Try to eat a variety of colors and types of vegetables to get a full range of vitamins and minerals. Remember to wash your vegetables well before eating. Fruits Any fruits. Try to eat a variety of colors and types of fruit to get a full range of vitamins and minerals. Remember to wash your fruits well before eating. Meats and Other Protein Sources Lean meats, including chicken, [place], fish, and lean cuts of beef, veal, or pork. Make sure that all meats are cooked to "well done." Tofu. Tempeh. Beans. Eggs. Peanut butter and other nut butters. Seafood, such as shrimp, crab, and lobster. If you choose fish, select types that are higher in omega-3 fatty acids, including salmon, herring, mussels, trout, sardines, and pollock. Make sure that all meats are cooked to food-safe temperatures. Dairy Pasteurized milk and milk alternatives. Pasteurized yogurt and pasteurized cheese. Cottage cheese. Sour cream. Beverages Water. Juices that contain 100% fruit juice or vegetable juice. Caffeine-free teas and decaffeinated coffee. Drinks that contain  caffeine are okay to drink, but it is better to avoid caffeine. Keep your total caffeine intake to less than 200 mg each day (12 oz of coffee, tea, or soda) or as directed by your health care provider. Condiments Any pasteurized condiments. Sweets and Desserts Any sweets and desserts. Fats and Oils Any fats and oils. The items listed above may not be a complete list of recommended foods or beverages. Contact your dietitian for more options. What foods are not recommended? Vegetables Unpasteurized (raw) vegetable juices. Fruits Unpasteurized (raw) fruit juices. Meats and Other Protein Sources Cured meats that have nitrates, such as bacon, salami, and hotdogs. Luncheon meats, bologna, or other deli meats (unless they are reheated until they are steaming hot). Refrigerated pate, meat spreads from a meat counter, smoked seafood that is found in the refrigerated section of a store. Raw fish, such as sushi or sashimi. High mercury content fish, such as tilefish, shark, swordfish, and king mackerel. Raw meats, such as tuna or beef tartare. Undercooked meats and poultry. Make sure that all meats are cooked to food-safe temperatures. Dairy Unpasteurized (raw) milk and any foods that have raw milk in them. Soft cheeses, such as feta, queso blanco, queso fresco, Brie, Camembert cheeses, blue-veined cheeses, and Panela cheese (unless it is made with pasteurized milk, which must be stated on the label). Beverages Alcohol. Sugar-sweetened beverages, such as sodas, teas, or energy drinks. Condiments Homemade fermented foods and drinks, such as pickles, sauerkraut, or kombucha drinks. (Store-bought pasteurized versions of these are okay.) Other Salads that are made in the store, such as ham salad, chicken salad, egg salad, tuna salad, and seafood salad. The items listed above may not be a complete list of foods and beverages to avoid. Contact your dietitian for more information. This information is not  intended to replace advice given to you by your health care provider. Make sure you discuss any questions you have with your health care provider. Document Released: 08/11/2014 Document Revised: 04/03/2016 Document Reviewed: 04/11/2014 Elsevier Interactive Patient Education  2018 [employer]. Common Medications Safe in  Pregnancy  Acne:      Constipation:  Benzoyl Peroxide     Colace  Clindamycin      Dulcolax Suppository  Topica Erythromycin     Fibercon  Salicylic Acid      Metamucil         Miralax AVOID:        Senakot   Accutane    Cough:  Retin-A       Cough Drops  Tetracycline      Phenergan w/ Codeine if Rx  Minocycline      Robitussin (Plain & DM)  Antibiotics:     Crabs/Lice:  Ceclor       RID  Cephalosporins    AVOID:  E-Mycins      Kwell  Keflex  Macrobid/Macrodantin   Diarrhea:  Penicillin      Kao-Pectate  Zithromax      Imodium AD         PUSH FLUIDS AVOID:       Cipro     Fever:  Tetracycline      Tylenol (Regular or Extra  Minocycline       Strength)  Levaquin      Extra Strength-Do not          Exceed 8 tabs/24 hrs Caffeine:        '200mg'$ /day (equiv. To 1 cup of coffee or  approx. 3 12 oz sodas)         Gas: Cold/Hayfever:       Gas-X  Benadryl      Mylicon  Claritin       Phazyme  **Claritin-D        Chlor-Trimeton    Headaches:  Dimetapp      ASA-Free Excedrin  Drixoral-Non-Drowsy     Cold Compress  Mucinex (Guaifenasin)     Tylenol (Regular or Extra  Sudafed/Sudafed-12 Hour     Strength)  **Sudafed PE Pseudoephedrine   Tylenol Cold & Sinus     Vicks Vapor Rub  Zyrtec  **AVOID if Problems With Blood Pressure         Heartburn: Avoid lying down for at least 1 hour after meals  Aciphex      Maalox     Rash:  Milk of Magnesia     Benadryl    Mylanta       1% Hydrocortisone Cream  Pepcid  Pepcid Complete   Sleep Aids:  Prevacid      Ambien   Prilosec       Benadryl  Rolaids       Chamomile Tea  Tums (Limit  4/day)     Unisom  Zantac       Tylenol PM         Warm milk-add vanilla or  Hemorrhoids:       Sugar for taste  Anusol/Anusol H.C.  (RX: Analapram 2.5%)  Sugar Substitutes:  Hydrocortisone OTC     Ok in moderation  Preparation H      Tucks        Vaseline lotion applied to tissue with wiping    Herpes:     Throat:  Acyclovir      Oragel  Famvir  Valtrex     Vaccines:         Flu Shot Leg Cramps:       *Gardasil  Benadryl      Hepatitis A         Hepatitis B Nasal Spray:  Pneumovax  Saline Nasal Spray     Polio Booster         Tetanus Nausea:       Tuberculosis test or PPD  Vitamin B6 25 mg TID   AVOID:    Dramamine      *Gardasil  Emetrol       Live Poliovirus  Ginger Root 250 mg QID    MMR (measles, mumps &  High Complex Carbs @ Bedtime    rebella)  Sea Bands-Accupressure    Varicella (Chickenpox)  Unisom 1/2 tab TID     *No known complications           If received before Pain:         Known pregnancy;   Darvocet       Resume series after  Lortab        Delivery  Percocet    Yeast:   Tramadol      Femstat  Tylenol 3      Gyne-lotrimin  Ultram       Monistat  Vicodin           MISC:         All Sunscreens           Hair Coloring/highlights          Insect Repellant's          (Including DEET)         Mystic Tans First Trimester of Pregnancy The first trimester of pregnancy is from week 1 until the end of week 13 (months 1 through 3). During this time, your baby will begin to develop inside you. At 6-8 weeks, the eyes and face are formed, and the heartbeat can be seen on ultrasound. At the end of 12 weeks, all the baby's organs are formed. Prenatal care is all the medical care you receive before the birth of your baby. Make sure you get good prenatal care and follow all of your doctor's instructions. Follow these instructions at home: Medicines  Take over-the-counter and prescription medicines only as told by your doctor. Some medicines are safe and some  medicines are not safe during pregnancy.  Take a prenatal vitamin that contains at least 600 micrograms (mcg) of folic acid.  If you have trouble pooping (constipation), take medicine that will make your stool soft (stool softener) if your doctor approves. Eating and drinking  Eat regular, healthy meals.  Your doctor will tell you the amount of weight gain that is right for you.  Avoid raw meat and uncooked cheese.  If you feel sick to your stomach (nauseous) or throw up (vomit): ? Eat 4 or 5 small meals a day instead of 3 large meals. ? Try eating a few soda crackers. ? Drink liquids between meals instead of during meals.  To prevent constipation: ? Eat foods that are high in fiber, like fresh fruits and vegetables, whole grains, and beans. ? Drink enough fluids to keep your pee (urine) clear or pale yellow. Activity  Exercise only as told by your doctor. Stop exercising if you have cramps or pain in your lower belly (abdomen) or low back.  Do not exercise if it is too hot, too humid, or if you are in a place of great height (high altitude).  Try to avoid standing for long periods of time. Move your legs often if you must stand in one place for a long time.  Avoid heavy lifting.  Wear low-heeled shoes. Sit and stand  up straight.  You can have sex unless your doctor tells you not to. Relieving pain and discomfort  Wear a good support bra if your breasts are sore.  Take warm water baths (sitz baths) to soothe pain or discomfort caused by hemorrhoids. Use hemorrhoid cream if your doctor says it is okay.  Rest with your legs raised if you have leg cramps or low back pain.  If you have puffy, bulging veins (varicose veins) in your legs: ? Wear support hose or compression stockings as told by your doctor. ? Raise (elevate) your feet for 15 minutes, 3-4 times a day. ? Limit salt in your food. Prenatal care  Schedule your prenatal visits by the twelfth week of  pregnancy.  Write down your questions. Take them to your prenatal visits.  Keep all your prenatal visits as told by your doctor. This is important. Safety  Wear your seat belt at all times when driving.  Make a list of emergency phone numbers. The list should include numbers for family, friends, the hospital, and police and fire departments. General instructions  Ask your doctor for a referral to a local prenatal class. Begin classes no later than at the start of month 6 of your pregnancy.  Ask for help if you need counseling or if you need help with nutrition. Your doctor can give you advice or tell you where to go for help.  Do not use hot tubs, steam rooms, or saunas.  Do not douche or use tampons or scented sanitary pads.  Do not cross your legs for long periods of time.  Avoid all herbs and alcohol. Avoid drugs that are not approved by your doctor.  Do not use any tobacco products, including cigarettes, chewing tobacco, and electronic cigarettes. If you need help quitting, ask your doctor. You may get counseling or other support to help you quit.  Avoid cat litter boxes and soil used by cats. These carry germs that can cause birth defects in the baby and can cause a loss of your baby (miscarriage) or stillbirth.  Visit your dentist. At home, brush your teeth with a soft toothbrush. Be gentle when you floss. Contact a doctor if:  You are dizzy.  You have mild cramps or pressure in your lower belly.  You have a nagging pain in your belly area.  You continue to feel sick to your stomach, you throw up, or you have watery poop (diarrhea).  You have a bad smelling fluid coming from your vagina.  You have pain when you pee (urinate).  You have increased puffiness (swelling) in your face, hands, legs, or ankles. Get help right away if:  You have a fever.  You are leaking fluid from your vagina.  You have spotting or bleeding from your vagina.  You have very bad belly  cramping or pain.  You gain or lose weight rapidly.  You throw up blood. It may look like coffee grounds.  You are around people who have [place] measles, fifth disease, or chickenpox.  You have a very bad headache.  You have shortness of breath.  You have any kind of trauma, such as from a fall or a car accident. Summary  The first trimester of pregnancy is from week 1 until the end of week 13 (months 1 through 3).  To take care of yourself and your unborn baby, you will need to eat healthy meals, take medicines only if your doctor tells you to do so, and do activities that  are safe for you and your baby.  Keep all follow-up visits as told by your doctor. This is important as your doctor will have to ensure that your baby is healthy and growing well. This information is not intended to replace advice given to you by your health care provider. Make sure you discuss any questions you have with your health care provider. Document Released: 04/14/2008 Document Revised: 11/04/2016 Document Reviewed: 11/04/2016 Elsevier Interactive Patient Education  2017 [employer]  WHAT OB PATIENTS CAN EXPECT   Confirmation of pregnancy and ultrasound ordered if medically indicated-[redacted] weeks gestation  New OB (NOB) intake with nurse and New OB (NOB) labs- [redacted] weeks gestation  New OB (NOB) physical examination with provider- 11/[redacted] weeks gestation  Flu vaccine-[redacted] weeks gestation  Anatomy scan-[redacted] weeks gestation  Glucose tolerance test, blood work to test for anemia, T-dap vaccine-[redacted] weeks gestation  Vaginal swabs/cultures-STD/Group B strep-[redacted] weeks gestation  Appointments every 4 weeks until 28 weeks  Every 2 weeks from 28 weeks until 36 weeks  Weekly visits from 36 weeks until delivery

## 2017-11-23 NOTE — Progress Notes (Signed)
NOB physical- pt is doing well, just having slight fatigue

## 2017-11-23 NOTE — Progress Notes (Signed)
NEW OB HISTORY AND PHYSICAL  SUBJECTIVE:       Bonnie Graham is a 22 y.o. [id number] female, Patient's last menstrual period was 09/19/2017., Estimated Date of Delivery: 06/26/18, 7063w2d, presents today for establishment of Prenatal Care.  She has no unusual complaints. Reports resolving breast tenderness.   Denies difficulty breathing or respiratory distress, chest pain, abdominal pain, vaginal bleeding, dysuria, and leg pain or swelling.    Gynecologic History  Patient's last menstrual period was 09/19/2017.   Contraception: none   Last Pap: N/A.  Obstetric History  OB History  Gravida Para Term Preterm AB Living  1 0 0 0 0 0  SAB TAB Ectopic Multiple Live Births  0 0 0 0 0    # Outcome Date GA Lbr Len/2nd Weight Sex Delivery Anes PTL Lv  1 Current               Past Medical History:  Diagnosis Date  . Heavy periods   . Painful menstrual periods     Past Surgical History:  Procedure Laterality Date  . NO PAST SURGERIES      Current Outpatient Medications on File Prior to Visit  Medication Sig Dispense Refill  . Prenatal Vit-Fe Fumarate-FA (PRENATAL MULTIVITAMIN) TABS tablet Take 1 tablet by mouth daily at 12 noon.    . fluticasone (FLONASE) 50 MCG/ACT nasal spray Place into the nose.     No current facility-administered medications on file prior to visit.     No Known Allergies  Social History   Socioeconomic History  . Marital status: In a relationship    Spouse name: Not on file  . Number of children: Not on file  . Years of education: Not on file  . Highest education level: Not on file  Social Needs  . Financial resource strain: Not on file  . Food insecurity - worry: Not on file  . Food insecurity - inability: Not on file  . Transportation needs - medical: Not on file  . Transportation needs - non-medical: Not on file  Occupational History  . Not on file  Tobacco Use  . Smoking status: Never Smoker  . Smokeless tobacco: Never Used  Substance  and Sexual Activity  . Alcohol use: No  . Drug use: No  . Sexual activity: Yes    Birth control/protection: None  Other Topics Concern  . Not on file  Social History Narrative  . Not on file    Family History  Problem Relation Age of Onset  . Heart disease Father   . Endometriosis Sister   . Endometriosis Maternal Grandmother        grt  . Breast cancer Neg Hx   . Ovarian cancer Neg Hx   . Colon cancer Neg Hx   . Diabetes Neg Hx     The following portions of the patient's history were reviewed and updated as appropriate: allergies, current medications, past OB history, past medical history, past surgical history, past family history, past social history, and problem list.    OBJECTIVE:  BP 106/76   Pulse 72   Wt 151 lb (68.5 kg)   LMP 09/19/2017   BMI 26.75 kg/m   Initial Physical Exam (New OB)  GENERAL APPEARANCE: alert, well appearing, in no apparent distress  HEAD: normocephalic, atraumatic  MOUTH: mucous membranes moist, pharynx normal without lesions and dental hygiene good  THYROID: no thyromegaly or masses present  BREASTS: not examined  LUNGS: clear to auscultation, no wheezes, rales or  rhonchi, symmetric air entry  HEART: regular rate and rhythm, no murmurs  ABDOMEN: soft, nontender, nondistended, no abnormal masses, no epigastric pain and FHT present  EXTREMITIES: no redness or tenderness in the calves or thighs  SKIN: normal coloration and turgor, no rashes  LYMPH NODES: no adenopathy palpable  NEUROLOGIC: alert, oriented, normal speech, no focal findings or movement disorder noted  PELVIC EXAM: not indicated  ASSESSMENT: Normal pregnancy Varicella non-immune  PLAN: Prenatal care New OB counseling: The patient has been given an overview regarding routine prenatal care. Recommendations regarding diet, weight gain, and exercise in pregnancy were given. Prenatal testing, optional genetic testing, and ultrasound use in pregnancy were  reviewed.  Benefits of Breast Feeding were discussed. The patient is encouraged to consider nursing her baby post partum. See orders   Gunnar Bulla, CNM [facility], [facility]

## 2017-12-10 DIAGNOSIS — G8929 Other chronic pain: Secondary | ICD-10-CM | POA: Diagnosis not present

## 2017-12-10 DIAGNOSIS — M545 Low back pain: Secondary | ICD-10-CM | POA: Diagnosis not present

## 2017-12-10 DIAGNOSIS — Z331 Pregnant state, incidental: Secondary | ICD-10-CM | POA: Diagnosis not present

## 2017-12-13 ENCOUNTER — Emergency Department: Payer: BLUE CROSS/BLUE SHIELD

## 2017-12-13 ENCOUNTER — Emergency Department
Admission: EM | Admit: 2017-12-13 | Discharge: 2017-12-14 | Disposition: A | Discharge status: Home / Self Care | Payer: BLUE CROSS/BLUE SHIELD | Attending: Emergency Medicine | Admitting: Emergency Medicine

## 2017-12-13 ENCOUNTER — Other Ambulatory Visit: Payer: Self-pay

## 2017-12-13 DIAGNOSIS — Z3A12 12 weeks gestation of pregnancy: Secondary | ICD-10-CM | POA: Diagnosis not present

## 2017-12-13 DIAGNOSIS — R112 Nausea with vomiting, unspecified: Secondary | ICD-10-CM | POA: Insufficient documentation

## 2017-12-13 DIAGNOSIS — R1031 Right lower quadrant pain: Secondary | ICD-10-CM | POA: Insufficient documentation

## 2017-12-13 DIAGNOSIS — Z79899 Other long term (current) drug therapy: Secondary | ICD-10-CM | POA: Insufficient documentation

## 2017-12-13 DIAGNOSIS — R197 Diarrhea, unspecified: Secondary | ICD-10-CM | POA: Insufficient documentation

## 2017-12-13 DIAGNOSIS — N898 Other specified noninflammatory disorders of vagina: Secondary | ICD-10-CM | POA: Insufficient documentation

## 2017-12-13 DIAGNOSIS — O219 Vomiting of pregnancy, unspecified: Secondary | ICD-10-CM | POA: Diagnosis not present

## 2017-12-13 DIAGNOSIS — N83201 Unspecified ovarian cyst, right side: Secondary | ICD-10-CM | POA: Insufficient documentation

## 2017-12-13 DIAGNOSIS — R109 Unspecified abdominal pain: Secondary | ICD-10-CM

## 2017-12-13 DIAGNOSIS — O26891 Other specified pregnancy related conditions, first trimester: Secondary | ICD-10-CM

## 2017-12-13 DIAGNOSIS — O9989 Other specified diseases and conditions complicating pregnancy, childbirth and the puerperium: Secondary | ICD-10-CM | POA: Insufficient documentation

## 2017-12-13 LAB — CBC
HEMATOCRIT: 42.3 % (ref 35.0–47.0)
Hemoglobin: 14.1 g/dL (ref 12.0–16.0)
MCH: 28.8 pg (ref 26.0–34.0)
MCHC: 33.4 g/dL (ref 32.0–36.0)
MCV: 86.3 fL (ref 80.0–100.0)
Platelets: 327 10*3/uL (ref 150–440)
RBC: 4.91 MIL/uL (ref 3.80–5.20)
RDW: 14.5 % (ref 11.5–14.5)
WBC: 11.1 10*3/uL — ABNORMAL HIGH (ref 3.6–11.0)

## 2017-12-13 LAB — COMPREHENSIVE METABOLIC PANEL
ALBUMIN: 3.9 g/dL (ref 3.5–5.0)
ALK PHOS: 104 U/L (ref 38–126)
ALT: 14 U/L (ref 14–54)
AST: 21 U/L (ref 15–41)
Anion gap: 9 (ref 5–15)
BILIRUBIN TOTAL: 0.4 mg/dL (ref 0.3–1.2)
BUN: 9 mg/dL (ref 6–20)
CO2: 20 mmol/L — ABNORMAL LOW (ref 22–32)
CREATININE: 0.55 mg/dL (ref 0.44–1.00)
Calcium: 9.2 mg/dL (ref 8.9–10.3)
Chloride: 106 mmol/L (ref 101–111)
GFR calc Af Amer: >60 mL/min (ref >60)
GLUCOSE: 85 mg/dL (ref 65–99)
POTASSIUM: 4 mmol/L (ref 3.5–5.1)
Sodium: 135 mmol/L (ref 135–145)
TOTAL PROTEIN: 7.6 g/dL (ref 6.5–8.1)

## 2017-12-13 LAB — URINALYSIS, COMPLETE (UACMP) WITH MICROSCOPIC
Bilirubin Urine: NEGATIVE
Glucose, UA: NEGATIVE mg/dL
Ketones, ur: 5 mg/dL — AB
NITRITE: NEGATIVE
PH: 6 (ref 5.0–8.0)
Protein, ur: NEGATIVE mg/dL
SPECIFIC GRAVITY, URINE: 1.024 (ref 1.005–1.030)

## 2017-12-13 LAB — HCG, QUANTITATIVE, PREGNANCY: HCG, BETA CHAIN, QUANT, S: 154860 m[IU]/mL — AB (ref <5)

## 2017-12-13 MED ORDER — DEXTROSE-NACL 5-0.45 % IV SOLN
INTRAVENOUS | Status: DC
  Administered 2017-12-14: via INTRAVENOUS

## 2017-12-13 NOTE — ED Notes (Signed)
ED Provider at bedside. 

## 2017-12-13 NOTE — ED Notes (Signed)
Pt to u/s

## 2017-12-13 NOTE — ED Provider Notes (Signed)
-----------------------------------------   11:41 PM on 12/13/2017 -----------------------------------------  Updated patient and spouse of ultrasound results.  Appendix was unable to be visualized due to overlying bowel and gas.  Ultrasound tech reports diminished size of right ovarian cyst of 2.3 cm.  This is not mentioned in the final radiology report.  Examined abdomen and patient remains with right lower pain without rebound or guarding.  Discussed with patient and will proceed with MRI to evaluate for appendicitis.   ----------------------------------------- 3:20 AM on 12/14/2017 -----------------------------------------  Discussed MRI with radiologist Dr. Cherly Hensenhang: Appendix is visualized; no evidence of appendicitis.  Trace free fluid around right adnexa; suspect ruptured ovarian cyst.  Awakened patient from sleep to update her and her family member of MRI results.  She has Diclegis at home as needed for nausea.  Encouraged her to follow-up with her OB/GYN this week.  Strict return precautions given.  Patient and family member verbalize understanding and agree with plan of care.   Irean HongSung, Henrry Feil J, MD 12/14/17 [phone]

## 2017-12-13 NOTE — ED Provider Notes (Signed)
[facility] Emergency Department Provider Note  ____________________________________________   First MD Initiated Contact with Patient 12/13/17 2143     (approximate)  I have reviewed the triage vital signs and the nursing notes.   HISTORY  Chief Complaint Abdominal Pain and Emesis   HPI Bonnie Graham is a 22 y.o. female who is a G1 P0 at 12 weeks was presenting with right lower quadrant abdominal pain.  Says the pain is been ongoing for the past 24-48 hours.  She says that she had nausea vomiting and diarrhea yesterday which has now stopped.  However, she says that her pain worsens when she eats.  She says that she also has a history of an ovarian cyst to the right side which was diagnosed in December during an ultrasound which showed a single 10 intrauterine pregnancy.  She states that she has no vaginal bleeding.  Says that she has a vaginal discharge that is been persistent ever since she started her pregnancy.  Says that she has been worked up for STDs as well as yeast infection is all come back negative.  Says the pain is been intermittently radiating around to her back as well.  No history of kidney stones.  Says the pain has been intermittent.  Past Medical History:  Diagnosis Date  . Heavy periods   . Painful menstrual periods     Patient Active Problem List   Diagnosis Date Noted  . Maternal varicella, non-immune 11/11/2017  . History of heavy periods 01/02/2017  . Dysmenorrhea 01/02/2017  . Family history of endometriosis in first degree relative 01/02/2017    Past Surgical History:  Procedure Laterality Date  . NO PAST SURGERIES      Prior to Admission medications   Medication Sig Start Date End Date Taking? Authorizing Provider  fluticasone (FLONASE) 50 MCG/ACT nasal spray Place into the nose. 09/27/17   [provider]  Prenatal Vit-Fe Fumarate-FA (PRENATAL MULTIVITAMIN) TABS tablet Take 1 tablet by mouth daily at 12 noon.     [provider]    Allergies Patient has no known allergies.  Family History  Problem Relation Age of Onset  . Heart disease Father   . Endometriosis Sister   . Endometriosis Maternal Grandmother        grt  . Breast cancer Neg Hx   . Ovarian cancer Neg Hx   . Colon cancer Neg Hx   . Diabetes Neg Hx     Social History Social History   Tobacco Use  . Smoking status: Never Smoker  . Smokeless tobacco: Never Used  Substance Use Topics  . Alcohol use: No  . Drug use: No    Review of Systems  Constitutional: No fever/chills Eyes: No visual changes. ENT: No sore throat. Cardiovascular: Denies chest pain. Respiratory: Denies shortness of breath. Gastrointestinal:  No diarrhea.  No constipation. Genitourinary: Negative for dysuria. Musculoskeletal: Negative for back pain. Skin: Negative for rash. Neurological: Negative for headaches, focal weakness or numbness.   ____________________________________________   PHYSICAL EXAM:  VITAL SIGNS: ED Triage Vitals [12/13/17 2049]  Enc Vitals Group     BP (!) 117/54     Pulse Rate 97     Resp 16     Temp 98.9 F (37.2 C)     Temp Source Oral     SpO2 97 %     Weight 155 lb (70.3 kg)     Height 5\' 3"  (1.6 m)     Head Circumference  Peak Flow      Pain Score 6     Pain Loc      Pain Edu?      Excl. in GC?     Constitutional: Alert and oriented. Well appearing and in no acute distress. Eyes: Conjunctivae are normal.  Head: Atraumatic. Nose: No congestion/rhinnorhea. Mouth/Throat: Mucous membranes are moist.  Neck: No stridor.   Cardiovascular: Normal rate, regular rhythm. Grossly normal heart sounds.  Respiratory: Normal respiratory effort.  No retractions. Lungs CTAB. Gastrointestinal: Soft with tenderness over the right lower quadrant at McBurney's point.  No guarding.  No rebound tenderness. No distention. No CVA tenderness. Musculoskeletal: No lower extremity tenderness nor edema.  No joint  effusions. Neurologic:  Normal speech and language. No gross focal neurologic deficits are appreciated. Skin:  Skin is warm, dry and intact. No rash noted. Psychiatric: Mood and affect are normal. Speech and behavior are normal.  ____________________________________________   LABS (all labs ordered are listed, but only abnormal results are displayed)  Labs Reviewed  COMPREHENSIVE METABOLIC PANEL - Abnormal; Notable for the following components:      Result Value   CO2 20 (*)    All other components within normal limits  CBC - Abnormal; Notable for the following components:   WBC 11.1 (*)    All other components within normal limits  URINALYSIS, COMPLETE (UACMP) WITH MICROSCOPIC - Abnormal; Notable for the following components:   Color, Urine YELLOW (*)    APPearance HAZY (*)    Hgb urine dipstick SMALL (*)    Ketones, ur 5 (*)    Leukocytes, UA SMALL (*)    Bacteria, UA RARE (*)    Squamous Epithelial / LPF 0-5 (*)    All other components within normal limits  HCG, QUANTITATIVE, PREGNANCY - Abnormal; Notable for the following components:   hCG, Beta Chain, Quant, S 154,860 (*)    All other components within normal limits   ____________________________________________  EKG   ____________________________________________  RADIOLOGY  Pending ultrasounds at this time. ____________________________________________   PROCEDURES  Procedure(s) performed:   Procedures  Critical Care performed:   ____________________________________________   INITIAL IMPRESSION / ASSESSMENT AND PLAN / ED COURSE  Pertinent labs & imaging results that were available during my care of the patient were reviewed by me and considered in my medical decision making (see chart for details).  Differential diagnosis includes, but is not limited to, threatened miscarriage, incomplete miscarriage, normal bleeding from an early trimester pregnancy, ectopic pregnancy, , blighted ovum,  vaginal/cervical trauma, subchorionic hemorrhage/hematoma, etc. Differential diagnosis includes, but is not limited to, ovarian cyst, ovarian torsion, acute appendicitis, diverticulitis, urinary tract infection/pyelonephritis, endometriosis, bowel obstruction, colitis, renal colic, gastroenteritis, hernia, fibroids, endometriosis, pregnancy related pain including ectopic pregnancy, etc.  As part of my medical decision making, I reviewed the following data within the electronic MEDICAL RECORD NUMBER Old chart reviewed   Patient with elevated white blood cell count but this appears chronic and actually appears lower than her last reading.  However, pain is to the right lower quadrant over McBurney's point.  We will attempt an ultrasound of the appendix is not visualized and will likely proceed to an MRI.    ----------------------------------------- 11:14 PM on 12/13/2017 -----------------------------------------  Ultrasound tech told me that the patient was past 100 pregnancy where she would be able to receive a right lower quadrant ultrasound officially.  However, that I told me that she would be able to look to see if she is able to  visualize the appendix.  However, the patient may still require MRI at this time.  Pending ultrasound.  Signed out to Dr. Dolores FrameSung.  ____________________________________________   FINAL CLINICAL IMPRESSION(S) / ED DIAGNOSES  Final diagnoses:  Right sided abdominal pain   Nausea vomiting diarrhea.   NEW MEDICATIONS STARTED DURING THIS VISIT:  New Prescriptions   No medications on file     Note:  This document was prepared using Dragon voice recognition software and may include unintentional dictation errors.     Myrna BlazerSchaevitz, Hadlyn Amero Matthew, MD 12/13/17 [phone]

## 2017-12-13 NOTE — ED Triage Notes (Signed)
Pt states is [redacted] weeks pregnant, edd 06/26/2018. Pt states she began to have vomiting and diarrhea on Friday. Pt states she now has vomiting with right sided abd pain radiating to back. Pt denies vaginal bleeding.

## 2017-12-13 NOTE — ED Notes (Signed)
Pt says she was told by on-call OB to come to the ED for evaluation; pt is [redacted] weeks pregnant; c/o abd pain with N/V/D; pt and visitor wearing masks

## 2017-12-14 ENCOUNTER — Emergency Department: Payer: BLUE CROSS/BLUE SHIELD

## 2017-12-14 ENCOUNTER — Encounter: Payer: Self-pay | Admitting: Certified Nurse Midwife

## 2017-12-14 DIAGNOSIS — R1031 Right lower quadrant pain: Secondary | ICD-10-CM | POA: Diagnosis not present

## 2017-12-14 NOTE — ED Notes (Signed)
Patient transported to MRI 

## 2017-12-14 NOTE — Discharge Instructions (Signed)
1.  Clear liquids x 12 hours, then bland diet x 3 days, then slowly advance diet as tolerated. 2.  Return to the ER for worsening symptoms, persistent vomiting, difficulty breathing or other concerns.

## 2017-12-21 ENCOUNTER — Ambulatory Visit (INDEPENDENT_AMBULATORY_CARE_PROVIDER_SITE_OTHER): Payer: BLUE CROSS/BLUE SHIELD | Admitting: Certified Nurse Midwife

## 2017-12-21 ENCOUNTER — Encounter: Payer: Self-pay | Admitting: Certified Nurse Midwife

## 2017-12-21 VITALS — BP 106/70 | HR 93 | Wt 152.2 lb

## 2017-12-21 DIAGNOSIS — Z3402 Encounter for supervision of normal first pregnancy, second trimester: Secondary | ICD-10-CM

## 2017-12-21 LAB — POCT URINALYSIS DIPSTICK
Bilirubin, UA: NEGATIVE
Glucose, UA: NEGATIVE
Ketones, UA: NEGATIVE
NITRITE UA: NEGATIVE
PH UA: 7 (ref 5.0–8.0)
PROTEIN UA: NEGATIVE
RBC UA: NEGATIVE
Spec Grav, UA: 1.015 (ref 1.010–1.025)
Urobilinogen, UA: 0.2 E.U./dL

## 2017-12-21 NOTE — Progress Notes (Signed)
PT is noticing some hot flashes to the point she has to sit down or go to a cool place.

## 2017-12-21 NOTE — Addendum Note (Signed)
Addended by: Silvano BilisHAMPTON, Lendy Dittrich L on: 12/21/2017 11:42 AM   Modules accepted: Orders

## 2017-12-21 NOTE — Progress Notes (Signed)
ROB, doing well.  Discussed u/s at 20 wks. She verbalizes understanding. She complains of occasion bouts of dizziness. Encouraged her to sit after standing for a few hours, to stand slowly, small frequent meal to prevent hyperglycemia. She verbalizes understanding and agrees to plan.  Follow up 4 wks.   Doreene BurkeAnnie Helayna Dun, CNM

## 2017-12-21 NOTE — Patient Instructions (Signed)

## 2018-01-18 ENCOUNTER — Ambulatory Visit (INDEPENDENT_AMBULATORY_CARE_PROVIDER_SITE_OTHER): Payer: BLUE CROSS/BLUE SHIELD | Admitting: Certified Nurse Midwife

## 2018-01-18 VITALS — BP 122/72 | HR 80 | Wt 151.4 lb

## 2018-01-18 DIAGNOSIS — Z3689 Encounter for other specified antenatal screening: Secondary | ICD-10-CM

## 2018-01-18 DIAGNOSIS — Z3402 Encounter for supervision of normal first pregnancy, second trimester: Secondary | ICD-10-CM

## 2018-01-18 DIAGNOSIS — R040 Epistaxis: Secondary | ICD-10-CM

## 2018-01-18 LAB — POCT URINALYSIS DIPSTICK
Glucose, UA: NEGATIVE
KETONES UA: NEGATIVE
Leukocytes, UA: NEGATIVE
Nitrite, UA: NEGATIVE
Protein, UA: NEGATIVE
RBC UA: NEGATIVE
Spec Grav, UA: 1.02 (ref 1.010–1.025)
Urobilinogen, UA: 0.2 E.U./dL
pH, UA: 7 (ref 5.0–8.0)

## 2018-01-18 NOTE — Patient Instructions (Addendum)
Back Pain in Pregnancy Back pain during pregnancy is common. Back pain may be caused by several factors that are related to changes during your pregnancy. Follow these instructions at home: Managing pain, stiffness, and swelling  If directed, apply ice for sudden (acute) back pain. ? Put ice in a plastic bag. ? Place a towel between your skin and the bag. ? Leave the ice on for 20 minutes, 2-3 times per day.  If directed, apply heat to the affected area before you exercise: ? Place a towel between your skin and the heat pack or heating pad. ? Leave the heat on for 20-30 minutes. ? Remove the heat if your skin turns bright red. This is especially important if you are unable to feel pain, heat, or cold. You may have a greater risk of getting burned. Activity  Exercise as told by your health care provider. Exercising is the best way to prevent or manage back pain.  Listen to your body when lifting. If lifting hurts, ask for help or bend your knees. This uses your leg muscles instead of your back muscles.  Squat down when picking up something from the floor. Do not bend over.  Only use bed rest as told by your health care provider. Bed rest should only be used for the most severe episodes of back pain. Standing, Sitting, and Lying Down  Do not stand in one place for long periods of time.  Use good posture when sitting. Make sure your head rests over your shoulders and is not hanging forward. Use a pillow on your lower back if necessary.  Try sleeping on your side, preferably the left side, with a pillow or two between your legs. If you are sore after a night's rest, your bed may be too soft. A firm mattress may provide more support for your back during pregnancy. General instructions  Do not wear high heels.  Eat a healthy diet. Try to gain weight within your health care provider's recommendations.  Use a maternity girdle, elastic sling, or back brace as told by your health care  provider.  Take over-the-counter and prescription medicines only as told by your health care provider.  Keep all follow-up visits as told by your health care provider. This is important. This includes any visits with any specialists, such as a physical therapist. Contact a health care provider if:  Your back pain interferes with your daily activities.  You have increasing pain in other parts of your body. Get help right away if:  You develop numbness, tingling, weakness, or problems with the use of your arms or legs.  You develop severe back pain that is not controlled with medicine.  You have a sudden change in bowel or bladder control.  You develop shortness of breath, dizziness, or you faint.  You develop nausea, vomiting, or sweating.  You have back pain that is a rhythmic, cramping pain similar to labor pains. Labor pain is usually 1-2 minutes apart, lasts for about 1 minute, and involves a bearing down feeling or pressure in your pelvis.  You have back pain and your water breaks or you have vaginal bleeding.  You have back pain or numbness that travels down your leg.  Your back pain developed after you fell.  You develop pain on one side of your back.  You see blood in your urine.  You develop skin blisters in the area of your back pain. This information is not intended to replace advice given to you   by your health care provider. Make sure you discuss any questions you have with your health care provider. Document Released: 02/04/2006 Document Revised: 04/03/2016 Document Reviewed: 07/11/2015 Elsevier Interactive Patient Education  2018 [employer]  Common Medications Safe in Pregnancy  Acne:      Constipation:  Benzoyl Peroxide     Colace  Clindamycin      Dulcolax Suppository  Topica Erythromycin     Fibercon  Salicylic Acid      Metamucil         Miralax AVOID:        Senakot   Accutane    Cough:  Retin-A       Cough Drops  Tetracycline      Phenergan w/  Codeine if Rx  Minocycline      Robitussin (Plain & DM)  Antibiotics:     Crabs/Lice:  Ceclor       RID  Cephalosporins    AVOID:  E-Mycins      Kwell  Keflex  Macrobid/Macrodantin   Diarrhea:  Penicillin      Kao-Pectate  Zithromax      Imodium AD         PUSH FLUIDS AVOID:       Cipro     Fever:  Tetracycline      Tylenol (Regular or Extra  Minocycline       Strength)  Levaquin      Extra Strength-Do not          Exceed 8 tabs/24 hrs Caffeine:        <200mg/day (equiv. To 1 cup of coffee or  approx. 3 12 oz sodas)         Gas: Cold/Hayfever:       Gas-X  Benadryl      Mylicon  Claritin       Phazyme  **Claritin-D        Chlor-Trimeton    Headaches:  Dimetapp      ASA-Free Excedrin  Drixoral-Non-Drowsy     Cold Compress  Mucinex (Guaifenasin)     Tylenol (Regular or Extra  Sudafed/Sudafed-12 Hour     Strength)  **Sudafed PE Pseudoephedrine   Tylenol Cold & Sinus     Vicks Vapor Rub  Zyrtec  **AVOID if Problems With Blood Pressure         Heartburn: Avoid lying down for at least 1 hour after meals  Aciphex      Maalox     Rash:  Milk of Magnesia     Benadryl    Mylanta       1% Hydrocortisone Cream  Pepcid  Pepcid Complete   Sleep Aids:  Prevacid      Ambien   Prilosec       Benadryl  Rolaids       Chamomile Tea  Tums (Limit 4/day)     Unisom  Zantac       Tylenol PM         Warm milk-add vanilla or  Hemorrhoids:       Sugar for taste  Anusol/Anusol H.C.  (RX: Analapram 2.5%)  Sugar Substitutes:  Hydrocortisone OTC     Ok in moderation  Preparation H      Tucks        Vaseline lotion applied to tissue with wiping    Herpes:     Throat:  Acyclovir      Oragel  Famvir  Valtrex     Vaccines:           Flu Shot Leg Cramps:       *Gardasil  Benadryl      Hepatitis A         Hepatitis B Nasal Spray:       Pneumovax  Saline Nasal Spray     Polio Booster         Tetanus Nausea:       Tuberculosis test or PPD  Vitamin B6 25 mg  TID   AVOID:    Dramamine      *Gardasil  Emetrol       Live Poliovirus  Ginger Root 250 mg QID    MMR (measles, mumps &  High Complex Carbs @ Bedtime    rebella)  Sea Bands-Accupressure    Varicella (Chickenpox)  Unisom 1/2 tab TID     *No known complications           If received before Pain:         Known pregnancy;   Darvocet       Resume series after  Lortab        Delivery  Percocet    Yeast:   Tramadol      Femstat  Tylenol 3      Gyne-lotrimin  Ultram       Monistat  Vicodin           MISC:         All Sunscreens           Hair Coloring/highlights          Insect Repellant's          (Including DEET)         Mystic Tans Round Ligament Pain The round ligament is a cord of muscle and tissue that helps to support the uterus. It can become a source of pain during pregnancy if it becomes stretched or twisted as the baby grows. The pain usually begins in the second trimester of pregnancy, and it can come and go until the baby is delivered. It is not a serious problem, and it does not cause harm to the baby. Round ligament pain is usually a short, sharp, and pinching pain, but it can also be a dull, lingering, and aching pain. The pain is felt in the lower side of the abdomen or in the groin. It usually starts deep in the groin and moves up to the outside of the hip area. Pain can occur with:  A sudden change in position.  Rolling over in bed.  Coughing or sneezing.  Physical activity.  Follow these instructions at home: Watch your condition for any changes. Take these steps to help with your pain:  When the pain starts, relax. Then try: ? Sitting down. ? Flexing your knees up to your abdomen. ? Lying on your side with one pillow under your abdomen and another pillow between your legs. ? Sitting in a warm bath for 15-20 minutes or until the pain goes away.  Take over-the-counter and prescription medicines only as told by your health care provider.  Move slowly when you  sit and stand.  Avoid long walks if they cause pain.  Stop or lessen your physical activities if they cause pain.  Contact a health care provider if:  Your pain does not go away with treatment.  You feel pain in your back that you did not have before.  Your medicine is not helping. Get help right away if:  You develop a fever or chills.  You  develop uterine contractions.  You develop vaginal bleeding.  You develop nausea or vomiting.  You develop diarrhea.  You have pain when you urinate. This information is not intended to replace advice given to you by your health care provider. Make sure you discuss any questions you have with your health care provider. Document Released: 08/05/2008 Document Revised: 04/03/2016 Document Reviewed: 01/03/2015 Elsevier Interactive Patient Education  2018 [place], Adult A nosebleed is when blood comes out of the nose. Nosebleeds are common. Usually, they are not a sign of a serious condition. Nosebleeds can happen if a small blood vessel in your nose starts to bleed or if the lining of your nose (mucous membrane) cracks. They are commonly caused by:  Allergies.  Colds.  Picking your nose.  Blowing your nose too hard.  An injury from sticking an object into your nose or getting hit in the nose.  Dry or cold air.  Less common causes of nosebleeds include:  Toxic fumes.  Something abnormal in the nose or in the air-filled spaces in the bones of the face (sinuses).  Growths in the nose, such as polyps.  Medicines or conditions that cause blood to clot slowly.  Certain illnesses or procedures that irritate or dry out the nasal passages.  Follow these instructions at home: When you have a nosebleed:  Sit down and tilt your head slightly forward.  Use a clean towel or tissue to pinch your nostrils under the bony part of your nose. After 10 minutes, let go of your nose and see if bleeding starts again. Do not  release pressure before that time. If there is still bleeding, repeat the pinching and holding for 10 minutes until the bleeding stops.  Do not place tissues or gauze in the nose to stop bleeding.  Avoid lying down and avoid tilting your head backward. That may make blood collect in the throat and cause gagging or coughing.  Use a nasal spray decongestant to help with a nosebleed as told by your health care provider.  Do not use petroleum jelly or mineral oil in your nose. It can drip into your lungs. After a nosebleed:  Avoid blowing your nose or sniffing for a number of hours.  Avoid straining, lifting, or bending at the waist for several days. You may resume other normal activities as you are able.  Use saline spray or a humidifier as told by your health care provider.  Aspirinand blood thinners make bleeding more likely. If you are prescribed these medicines and you suffer from nosebleeds: ? Ask your health care provider if you should stop taking the medicines or if you should adjust the dose. ? Do not stop taking medicines that your health care provider has recommended unless told by your health care provider.  If your nosebleed was caused by dry mucous membranes, use over-the-counter saline nasal spray or gel. This will keep the mucous membranes moist and allow them to heal. If you must use a lubricant: ? Choose one that is water-soluble. ? Use only as much as you need and use it only as often as needed. ? Do not lie down until several hours after you use it. Contact a health care provider if:  You have a fever.  You get nosebleeds often or more often than usual.  You bruise very easily.  You have a nosebleed from having something stuck in your nose.  You have bleeding in your mouth.  You vomit or cough up brown  material.  You have a nosebleed after you start a new medicine. Get help right away if:  You have a nosebleed after a fall or a head injury.  Your nosebleed  does not go away after 20 minutes.  You feel dizzy or weak.  You have unusual bleeding from other parts of your body.  You have unusual bruising on other parts of your body.  You become sweaty.  You vomit blood. This information is not intended to replace advice given to you by your health care provider. Make sure you discuss any questions you have with your health care provider. Document Released: 08/06/2005 Document Revised: 06/26/2016 Document Reviewed: 05/13/2016 Elsevier Interactive Patient Education  [employer].

## 2018-01-18 NOTE — Progress Notes (Signed)
ROB-Doing well, no questions or concerns. Anticipatory guidance regarding course of prenatal care. Reviewed red flag symptoms and when to call. RTC x 3 weeks for anatomy scan and ROB or sooner if needed.

## 2018-01-18 NOTE — Progress Notes (Signed)
Pt is here for an ROB visit. No c/o. 

## 2018-02-08 ENCOUNTER — Other Ambulatory Visit: Payer: BLUE CROSS/BLUE SHIELD

## 2018-02-08 ENCOUNTER — Encounter: Payer: Self-pay | Admitting: Certified Nurse Midwife

## 2018-02-08 ENCOUNTER — Ambulatory Visit (INDEPENDENT_AMBULATORY_CARE_PROVIDER_SITE_OTHER): Payer: BLUE CROSS/BLUE SHIELD | Admitting: Certified Nurse Midwife

## 2018-02-08 ENCOUNTER — Ambulatory Visit (INDEPENDENT_AMBULATORY_CARE_PROVIDER_SITE_OTHER): Payer: BLUE CROSS/BLUE SHIELD

## 2018-02-08 ENCOUNTER — Encounter: Payer: BLUE CROSS/BLUE SHIELD | Admitting: Certified Nurse Midwife

## 2018-02-08 VITALS — BP 101/72 | HR 76 | Wt 160.0 lb

## 2018-02-08 DIAGNOSIS — Z3689 Encounter for other specified antenatal screening: Secondary | ICD-10-CM | POA: Diagnosis not present

## 2018-02-08 DIAGNOSIS — Z3402 Encounter for supervision of normal first pregnancy, second trimester: Secondary | ICD-10-CM

## 2018-02-08 LAB — POCT URINALYSIS DIPSTICK
Bilirubin, UA: NEGATIVE
Blood, UA: NEGATIVE
Glucose, UA: NEGATIVE
KETONES UA: NEGATIVE
Leukocytes, UA: NEGATIVE
NITRITE UA: NEGATIVE
PH UA: 5 (ref 5.0–8.0)
PROTEIN UA: NEGATIVE
Spec Grav, UA: 1.015 (ref 1.010–1.025)
UROBILINOGEN UA: 0.2 U/dL

## 2018-02-08 NOTE — Progress Notes (Signed)
Pt is here for an ROB visit. 

## 2018-02-08 NOTE — Progress Notes (Signed)
ROB, anatomy scan today. Reviewed with pt. Normal- baby boy. Reviewed Round ligament pain and encouraged classes. Follow up 4 wks.   Doreene BurkeAnnie Octivia Canion, CNM   ULTRASOUND REPORT  Location: [facility] Date of Service:  02/08/2018  Indications: Anatomy Findings:  Singleton intrauterine pregnancy is visualized with FHR at 135 BPM. Biometrics give an (U/S) Gestational age of 20 1/7 weeks and an (U/S) EDD of 06/27/18; this correlates with the clinically established EDD of 06/26/18.  Fetal presentation is breech.  EFW: 333 grams (0lb 12oz). Placenta: Posterior and grade 1. AFI: WNL subjectively.  Anatomic survey is complete and appears WNL; Gender - Female.   Right Ovary measures 2.2 x 2.0 x 1.6 cm. It is normal in appearance. Left Ovary measures 1.6 x 1.8 x 1.0 cm. It is normal appearance. There is evidence of a corpus luteal cyst in the right ovary. Survey of the adnexa demonstrates no adnexal masses. There is no free peritoneal fluid in the cul de sac.  Impression: 1. 20 1/7 week Viable Singleton Intrauterine pregnancy by U/S. 2. (U/S) EDD is consistent with Clinically established (LMP) EDD of 06/26/18. 3. Normal Anatomy Scan  Recommendations: 1.Clinical correlation with the patient's History and Physical Exam.   Kari BaarsJill Long, RDMS

## 2018-02-08 NOTE — Patient Instructions (Signed)

## 2018-03-09 ENCOUNTER — Ambulatory Visit (INDEPENDENT_AMBULATORY_CARE_PROVIDER_SITE_OTHER): Payer: BLUE CROSS/BLUE SHIELD | Admitting: Obstetrics and Gynecology

## 2018-03-09 VITALS — BP 103/74 | HR 71 | Wt 164.1 lb

## 2018-03-09 DIAGNOSIS — Z3492 Encounter for supervision of normal pregnancy, unspecified, second trimester: Secondary | ICD-10-CM

## 2018-03-09 LAB — POCT URINALYSIS DIPSTICK
Blood, UA: NEGATIVE
GLUCOSE UA: NEGATIVE
Ketones, UA: NEGATIVE
Nitrite, UA: NEGATIVE
Spec Grav, UA: 1.015 (ref 1.010–1.025)
Urobilinogen, UA: 0.2 E.U./dL
pH, UA: 7 (ref 5.0–8.0)

## 2018-03-09 NOTE — Progress Notes (Signed)
ROB- doing well, discussed enrolling in classes and circumcision,  breast feeding preferences.

## 2018-03-09 NOTE — Patient Instructions (Signed)
Waterbirth Class  April 22, 2017  Wednesday 7:00p - 9:00p  [facility] [place], [place]  May 27, 2017  Wednesday 7:00p - 9:00p [facility] [place], [place]    July 01, 2017   Wednesday 7:00p - 9:000p [facility] [place], [place]  July 29, 2017  Wednesday  7:00p - 9:00p [facility] [place], [place]  August 26, 2017 Wednesday 7:00p - 9:00p [facility] [place],   Interested in a waterbirth?  This informational class will help you discover whether waterbirth is the right fit for you.  Education about waterbirth itself, supplies you would need and how to assemble your support team is what you can expect from this class.  Some obstetrical practices require this class in order to pursue a waterbirth.  (Not all obstetrical practices offer waterbirth check with your healthcare provider)  Register only the expectant mom, but you are encouraged to bring your partner to class!  Fees & Payment No fee  Register Online [email or web address]  

## 2018-03-09 NOTE — Progress Notes (Signed)
ROB- pt is doing well 

## 2018-04-06 ENCOUNTER — Other Ambulatory Visit: Payer: BLUE CROSS/BLUE SHIELD

## 2018-04-06 ENCOUNTER — Ambulatory Visit (INDEPENDENT_AMBULATORY_CARE_PROVIDER_SITE_OTHER): Payer: BLUE CROSS/BLUE SHIELD | Admitting: Certified Nurse Midwife

## 2018-04-06 ENCOUNTER — Encounter: Payer: Self-pay | Admitting: Certified Nurse Midwife

## 2018-04-06 VITALS — BP 110/68 | HR 88 | Wt 167.6 lb

## 2018-04-06 DIAGNOSIS — R252 Cramp and spasm: Secondary | ICD-10-CM

## 2018-04-06 DIAGNOSIS — O9989 Other specified diseases and conditions complicating pregnancy, childbirth and the puerperium: Secondary | ICD-10-CM

## 2018-04-06 DIAGNOSIS — Z131 Encounter for screening for diabetes mellitus: Secondary | ICD-10-CM | POA: Diagnosis not present

## 2018-04-06 DIAGNOSIS — Z130 Encounter for screening for diseases of the blood and blood-forming organs and certain disorders involving the immune mechanism: Secondary | ICD-10-CM | POA: Diagnosis not present

## 2018-04-06 DIAGNOSIS — Z3403 Encounter for supervision of normal first pregnancy, third trimester: Secondary | ICD-10-CM

## 2018-04-06 DIAGNOSIS — Z113 Encounter for screening for infections with a predominantly sexual mode of transmission: Secondary | ICD-10-CM

## 2018-04-06 DIAGNOSIS — O99891 Other specified diseases and conditions complicating pregnancy: Secondary | ICD-10-CM

## 2018-04-06 DIAGNOSIS — Z23 Encounter for immunization: Secondary | ICD-10-CM

## 2018-04-06 LAB — POCT URINALYSIS DIPSTICK
BILIRUBIN UA: NEGATIVE
Blood, UA: NEGATIVE
GLUCOSE UA: NEGATIVE
KETONES UA: NEGATIVE
Leukocytes, UA: NEGATIVE
Nitrite, UA: NEGATIVE
Protein, UA: NEGATIVE
Spec Grav, UA: 1.02 (ref 1.010–1.025)
UROBILINOGEN UA: 0.2 U/dL
pH, UA: 6.5 (ref 5.0–8.0)

## 2018-04-06 MED ORDER — TETANUS-DIPHTH-ACELL PERTUSSIS 5-2.5-18.5 LF-MCG/0.5 IM SUSP
0.5000 mL | Freq: Once | INTRAMUSCULAR | Status: AC
  Administered 2018-04-06: 0.5 mL via INTRAMUSCULAR

## 2018-04-06 MED ORDER — MAGNESIUM OXIDE 400 MG PO TABS: 400.0000 mg | ORAL_TABLET | Freq: Two times a day (BID) | ORAL | 1 refills | Status: DC

## 2018-04-06 NOTE — Patient Instructions (Signed)
WHAT OB PATIENTS CAN EXPECT   Confirmation of pregnancy and ultrasound ordered if medically indicated-[redacted] weeks gestation  New OB (NOB) intake with nurse and New OB (NOB) labs- [redacted] weeks gestation  New OB (NOB) physical examination with provider- 11/[redacted] weeks gestation  Flu vaccine-[redacted] weeks gestation  Anatomy scan-[redacted] weeks gestation  Glucose tolerance test, blood work to test for anemia, T-dap vaccine-[redacted] weeks gestation  Vaginal swabs/cultures-STD/Group B strep-[redacted] weeks gestation  Appointments every 4 weeks until 28 weeks  Every 2 weeks from 28 weeks until 36 weeks  Weekly visits from 36 weeks until delivery  Pain Relief During Labor and Delivery Many things can cause pain during labor and delivery, including:  Pressure on bones and ligaments due to the baby moving through the pelvis.  Stretching of tissues due to the baby moving through the birth canal.  Muscle tension due to anxiety or nervousness.  The uterus tightening (contracting) and relaxing to help move the baby.  There are many ways to deal with the pain of labor and delivery. They include:  Taking prenatal classes. Taking these classes helps you know what to expect during your baby's birth. What you learn will increase your confidence and decrease your anxiety.  Practicing relaxation techniques or doing relaxing activities, such as: ? Focused breathing. ? Meditation. ? Visualization. ? Aroma therapy. ? Listening to your favorite music. ? Hypnosis.  Taking a warm shower or bath (hydrotherapy). This may: ? Provide comfort and relaxation. ? Lessen your perception of pain. ? Decrease the amount of pain medicine needed. ? Decrease the length of labor.  Getting a massage or counterpressure on your back.  Applying warm packs or ice packs.  Changing positions often, moving around, or using a birthing ball.  Getting: ? Pain medicine through an IV or injection into a muscle. ? Pain medicine inserted into your  spinal column. ? Injections of sterile water just under the skin on your lower back (intradermal injections). ? Laughing gas (nitrous oxide).  Discuss your pain control options with your health care provider during your prenatal visits. Explore the options offered by your hospital or birth center. What kinds of medicine are available? There are two kinds of medicines that can be used to relieve pain during labor and delivery:  Analgesics. These medicines decrease pain without causing you to lose feeling or the ability to move your muscles.  Anesthetics. These medicines block feeling in the body and can decrease your ability to move freely.  Both of these kinds of medicine can cause minor side effects, such as nausea, trouble concentrating, and sleepiness. They can also decrease the baby's heart rate before birth and affect the baby's breathing rate after birth. For this reason, health care providers are careful about when and how much medicine is given. What are specific medicines and procedures that provide pain relief? Local Anesthetics Local anesthetics are used to numb a small area of the body. They may be used along with another kind of anesthetic or used to numb the nerves of the vagina, cervix, and perineum during the second stage of labor. General Anesthetics General anesthetics cause you to lose consciousness so you do not feel pain. They are usually only used for an emergency cesarean delivery. General anesthetics are given through an IV tube and a mask. Pudendal Block A pudendal block is a form of local anesthetic. It may be used to relieve the pain associated with pushing or stretching of the perineum at the time of delivery or to   further numb the perineum. A pudendal block is done by injecting numbing medicine through the vaginal wall into a nerve in the pelvis. Epidural Analgesia Epidural analgesia is given through a flexible IV catheter that is inserted into the lower back.  Numbing medicine is delivered continuously to the area near your spinal column nerves (epidural space). After having this type of analgesia, you may be able to move your legs but you most likely will not be able to walk. Depending on the amount of medicine given, you may lose all feeling in the lower half of your body, or you may retain some level of sensation, including the urge to push. Epidural analgesia can be used to provide pain relief for a vaginal birth. Spinal Block A spinal block is similar to epidural analgesia, but the medicine is injected into the spinal fluid instead of the epidural space. A spinal block is only given once. It starts to relieve pain quickly, but the pain relief lasts only 1-6 hours. Spinal blocks can be used for cesarean deliveries. Combined Spinal-Epidural (CSE) Block A CSE block combines the effects of a spinal block and epidural analgesia. The spinal block works quickly to block all pain. The epidural analgesia provides continuous pain relief, even after the effects of the spinal block have worn off. This information is not intended to replace advice given to you by your health care provider. Make sure you discuss any questions you have with your health care provider. Document Released: 02/12/2009 Document Revised: 04/04/2016 Document Reviewed: 03/19/2016 Elsevier Interactive Patient Education  2018 [employer]. [facility]  [place], [place], [place] [address]  Phone: [phone]   [facility] (second location)  [address] [place], [place] [address]  Phone: [phone]   [facility]) [place], [place], [place] [address] Phone: [phone]   [facility]  [address] [place],  [address]  Phone: [phone] Pain in Pregnancy Back pain during pregnancy is common. Back pain may be caused by several factors that are related to changes  during your pregnancy. Follow these instructions at home: Managing pain, stiffness, and swelling  If directed, apply ice for sudden (acute) back pain. ? Put ice in a plastic bag. ? Place a towel between your skin and the bag. ? Leave the ice on for 20 minutes, 2-3 times per day.  If directed, apply heat to the affected area before you exercise: ? Place a towel between your skin and the heat pack or heating pad. ? Leave the heat on for 20-30 minutes. ? Remove the heat if your skin turns bright red. This is especially important if you are unable to feel pain, heat, or cold. You may have a greater risk of getting burned. Activity  Exercise as told by your health care provider. Exercising is the best way to prevent or manage back pain.  Listen to your body when lifting. If lifting hurts, ask for help or bend your knees. This uses your leg muscles instead of your back muscles.  Squat down when picking up something from the floor. Do not bend over.  Only use bed rest as told by your health care provider. Bed rest should only be used for the most severe episodes of back pain. Standing, Sitting, and Lying Down  Do not stand in one place for long periods of time.  Use good posture when sitting. Make sure your head rests over your shoulders and  is not hanging forward. Use a pillow on your lower back if necessary.  Try sleeping on your side, preferably the left side, with a pillow or two between your legs. If you are sore after a night's rest, your bed may be too soft. A firm mattress may provide more support for your back during pregnancy. General instructions  Do not wear high heels.  Eat a healthy diet. Try to gain weight within your health care provider's recommendations.  Use a maternity girdle, elastic sling, or back brace as told by your health care provider.  Take over-the-counter and prescription medicines only as told by your health care provider.  Keep all follow-up visits as  told by your health care provider. This is important. This includes any visits with any specialists, such as a physical therapist. Contact a health care provider if:  Your back pain interferes with your daily activities.  You have increasing pain in other parts of your body. Get help right away if:  You develop numbness, tingling, weakness, or problems with the use of your arms or legs.  You develop severe back pain that is not controlled with medicine.  You have a sudden change in bowel or bladder control.  You develop shortness of breath, dizziness, or you faint.  You develop nausea, vomiting, or sweating.  You have back pain that is a rhythmic, cramping pain similar to labor pains. Labor pain is usually 1-2 minutes apart, lasts for about 1 minute, and involves a bearing down feeling or pressure in your pelvis.  You have back pain and your water breaks or you have vaginal bleeding.  You have back pain or numbness that travels down your leg.  Your back pain developed after you fell.  You develop pain on one side of your back.  You see blood in your urine.  You develop skin blisters in the area of your back pain. This information is not intended to replace advice given to you by your health care provider. Make sure you discuss any questions you have with your health care provider. Document Released: 02/04/2006 Document Revised: 04/03/2016 Document Reviewed: 07/11/2015 Elsevier Interactive Patient Education  2018 [employer]. Common Medications Safe in Pregnancy  Acne:      Constipation:  Benzoyl Peroxide     Colace  Clindamycin      Dulcolax Suppository  Topica Erythromycin     Fibercon  Salicylic Acid      Metamucil         Miralax AVOID:        Senakot   Accutane    Cough:  Retin-A       Cough Drops  Tetracycline      Phenergan w/ Codeine if Rx  Minocycline      Robitussin (Plain &  DM)  Antibiotics:     Crabs/Lice:  Ceclor       RID  Cephalosporins    AVOID:  E-Mycins      Kwell  Keflex  Macrobid/Macrodantin   Diarrhea:  Penicillin      Kao-Pectate  Zithromax      Imodium AD         PUSH FLUIDS AVOID:       Cipro     Fever:  Tetracycline      Tylenol (Regular or Extra  Minocycline       Strength)  Levaquin      Extra Strength-Do not          Exceed 8 tabs/24 hrs Caffeine:        <  $'200mg'm$ /day (equiv. To 1 cup of coffee or  approx. 3 12 oz sodas)         Gas: Cold/Hayfever:       Gas-X  Benadryl      Mylicon  Claritin       Phazyme  **Claritin-D        Chlor-Trimeton    Headaches:  Dimetapp      ASA-Free Excedrin  Drixoral-Non-Drowsy     Cold Compress  Mucinex (Guaifenasin)     Tylenol (Regular or Extra  Sudafed/Sudafed-12 Hour     Strength)  **Sudafed PE Pseudoephedrine   Tylenol Cold & Sinus     Vicks Vapor Rub  Zyrtec  **AVOID if Problems With Blood Pressure         Heartburn: Avoid lying down for at least 1 hour after meals  Aciphex      Maalox     Rash:  Milk of Magnesia     Benadryl    Mylanta       1% Hydrocortisone Cream  Pepcid  Pepcid Complete   Sleep Aids:  Prevacid      Ambien   Prilosec       Benadryl  Rolaids       Chamomile Tea  Tums (Limit 4/day)     Unisom  Zantac       Tylenol PM         Warm milk-add vanilla or  Hemorrhoids:       Sugar for taste  Anusol/Anusol H.C.  (RX: Analapram 2.5%)  Sugar Substitutes:  Hydrocortisone OTC     Ok in moderation  Preparation H      Tucks        Vaseline lotion applied to tissue with wiping    Herpes:     Throat:  Acyclovir      Oragel  Famvir  Valtrex     Vaccines:         Flu Shot Leg Cramps:       *Gardasil  Benadryl      Hepatitis A         Hepatitis B Nasal Spray:       Pneumovax  Saline Nasal Spray     Polio Booster         Tetanus Nausea:       Tuberculosis test or PPD  Vitamin B6 25 mg TID   AVOID:    Dramamine      *Gardasil  Emetrol       Live  Poliovirus  Ginger Root 250 mg QID    MMR (measles, mumps &  High Complex Carbs @ Bedtime    rebella)  Sea Bands-Accupressure    Varicella (Chickenpox)  Unisom 1/2 tab TID     *No known complications           If received before Pain:         Known pregnancy;   Darvocet       Resume series after  Lortab        Delivery  Percocet    Yeast:   Tramadol      Femstat  Tylenol 3      Gyne-lotrimin  Ultram       Monistat  Vicodin           MISC:         All Sunscreens           Hair Coloring/highlights          Insect  Repellant's          (Including DEET)         Mystic Tans Third Trimester of Pregnancy The third trimester is from week 29 through week 42, months 7 through 9. This trimester is when your unborn baby (fetus) is growing very fast. At the end of the ninth month, the unborn baby is about 20 inches in length. It weighs about 6-10 pounds. Follow these instructions at home:  Avoid all smoking, herbs, and alcohol. Avoid drugs not approved by your doctor.  Do not use any tobacco products, including cigarettes, chewing tobacco, and electronic cigarettes. If you need help quitting, ask your doctor. You may get counseling or other support to help you quit.  Only take medicine as told by your doctor. Some medicines are safe and some are not during pregnancy.  Exercise only as told by your doctor. Stop exercising if you start having cramps.  Eat regular, healthy meals.  Wear a good support bra if your breasts are tender.  Do not use hot tubs, steam rooms, or saunas.  Wear your seat belt when driving.  Avoid raw meat, uncooked cheese, and liter boxes and soil used by cats.  Take your prenatal vitamins.  Take 1500-2000 milligrams of calcium daily starting at the 20th week of pregnancy until you deliver your baby.  Try taking medicine that helps you poop (stool softener) as needed, and if your doctor approves. Eat more fiber by eating fresh fruit, vegetables, and whole grains.  Drink enough fluids to keep your pee (urine) clear or pale yellow.  Take warm water baths (sitz baths) to soothe pain or discomfort caused by hemorrhoids. Use hemorrhoid cream if your doctor approves.  If you have puffy, bulging veins (varicose veins), wear support hose. Raise (elevate) your feet for 15 minutes, 3-4 times a day. Limit salt in your diet.  Avoid heavy lifting, wear low heels, and sit up straight.  Rest with your legs raised if you have leg cramps or low back pain.  Visit your dentist if you have not gone during your pregnancy. Use a soft toothbrush to brush your teeth. Be gentle when you floss.  You can have sex (intercourse) unless your doctor tells you not to.  Do not travel far distances unless you must. Only do so with your doctor's approval.  Take prenatal classes.  Practice driving to the hospital.  Pack your hospital bag.  Prepare the baby's room.  Go to your doctor visits. Get help if:  You are not sure if you are in labor or if your water has broken.  You are dizzy.  You have mild cramps or pressure in your lower belly (abdominal).  You have a nagging pain in your belly area.  You continue to feel sick to your stomach (nauseous), throw up (vomit), or have watery poop (diarrhea).  You have bad smelling fluid coming from your vagina.  You have pain with peeing (urination). Get help right away if:  You have a fever.  You are leaking fluid from your vagina.  You are spotting or bleeding from your vagina.  You have severe belly cramping or pain.  You lose or gain weight rapidly.  You have trouble catching your breath and have chest pain.  You notice sudden or extreme puffiness (swelling) of your face, hands, ankles, feet, or legs.  You have not felt the baby move in over an hour.  You have severe headaches that do not go away with medicine.  You have vision changes. This information is not intended to replace advice given to you by your  health care provider. Make sure you discuss any questions you have with your health care provider. Document Released: 01/21/2010 Document Revised: 04/03/2016 Document Reviewed: 12/28/2012 Elsevier Interactive Patient Education  2017 [employer].

## 2018-04-06 NOTE — Progress Notes (Signed)
Bonnie Graham-Doing well, reports intermittent leg cramps. Discussed OTC treatment measures. Rx: Magnesium oxide, see orders. 28 week labs today. TDaP given. Blood transfusion consent reviewed and signed. Handouts given: [facility], [facility] [employer], and postpartum contraception options. Plans mother and boyfriend, Trinna Post as support. Anticipatory guidance regarding course of prenatal care. Reviewed red flag symptoms and when to call. RTC x  2 weeks for Bonnie Graham or sooner if needed.

## 2018-04-06 NOTE — Addendum Note (Signed)
Addended by: Shaune Spittle on: 04/06/2018 09:07 AM   Modules accepted: Orders

## 2018-04-06 NOTE — Addendum Note (Signed)
Addended by: Brooke Dare on: 04/06/2018 09:19 AM   Modules accepted: Orders

## 2018-04-06 NOTE — Progress Notes (Signed)
Pt is here for an ROB visit. 

## 2018-04-07 ENCOUNTER — Encounter: Payer: Self-pay | Admitting: Certified Nurse Midwife

## 2018-04-07 LAB — CBC
HEMOGLOBIN: 11.7 g/dL (ref 11.1–15.9)
Hematocrit: 35.6 % (ref 34.0–46.6)
MCH: 29.5 pg (ref 26.6–33.0)
MCHC: 32.9 g/dL (ref 31.5–35.7)
MCV: 90 fL (ref 79–97)
Platelets: 288 10*3/uL (ref 150–450)
RBC: 3.96 x10E6/uL (ref 3.77–5.28)
RDW: 13.9 % (ref 12.3–15.4)
WBC: 11.7 10*3/uL — AB (ref 3.4–10.8)

## 2018-04-07 LAB — RPR: RPR: NONREACTIVE

## 2018-04-07 LAB — GLUCOSE, 1 HOUR GESTATIONAL: GESTATIONAL DIABETES SCREEN: 75 mg/dL (ref 65–139)

## 2018-04-20 ENCOUNTER — Encounter: Payer: Self-pay | Admitting: Certified Nurse Midwife

## 2018-04-20 ENCOUNTER — Ambulatory Visit (INDEPENDENT_AMBULATORY_CARE_PROVIDER_SITE_OTHER): Payer: BLUE CROSS/BLUE SHIELD | Admitting: Certified Nurse Midwife

## 2018-04-20 VITALS — BP 120/68 | HR 91 | Wt 170.2 lb

## 2018-04-20 DIAGNOSIS — Z3403 Encounter for supervision of normal first pregnancy, third trimester: Secondary | ICD-10-CM

## 2018-04-20 LAB — POCT URINALYSIS DIPSTICK
BILIRUBIN UA: NEGATIVE
Glucose, UA: NEGATIVE
KETONES UA: NEGATIVE
Leukocytes, UA: NEGATIVE
Nitrite, UA: NEGATIVE
PH UA: 5 (ref 5.0–8.0)
Protein, UA: NEGATIVE
RBC UA: NEGATIVE
SPEC GRAV UA: 1.025 (ref 1.010–1.025)
UROBILINOGEN UA: 0.2 U/dL

## 2018-04-20 NOTE — Progress Notes (Signed)
ROB, doing well. No complaints . Feels good movement. Follow up in 2 wks.   Doreene BurkeAnnie Alex Mcmanigal, CNM

## 2018-04-20 NOTE — Patient Instructions (Signed)
[place] [place] [facility]  [facility]  [address], [place], [place] [address]  Phone: [phone]  [facility] (second location)  [address] [place], [place] [address]  Phone: [phone]  [facility] ([place]) [address], [place], [place] [address] Phone: [phone]  [facility]  [address] [place], [place] [address]  Phone: [phone] 

## 2018-04-20 NOTE — Progress Notes (Signed)
Pt is here for an ROB visit. 

## 2018-05-04 ENCOUNTER — Ambulatory Visit (INDEPENDENT_AMBULATORY_CARE_PROVIDER_SITE_OTHER): Payer: BLUE CROSS/BLUE SHIELD | Admitting: Certified Nurse Midwife

## 2018-05-04 VITALS — BP 119/72 | HR 92 | Wt 173.2 lb

## 2018-05-04 DIAGNOSIS — Z1389 Encounter for screening for other disorder: Secondary | ICD-10-CM

## 2018-05-04 DIAGNOSIS — Z331 Pregnant state, incidental: Secondary | ICD-10-CM

## 2018-05-04 DIAGNOSIS — Z3403 Encounter for supervision of normal first pregnancy, third trimester: Secondary | ICD-10-CM

## 2018-05-04 LAB — POCT URINALYSIS DIPSTICK
Bilirubin, UA: NEGATIVE
Glucose, UA: NEGATIVE
KETONES UA: NEGATIVE
LEUKOCYTES UA: NEGATIVE
Nitrite, UA: NEGATIVE
Protein, UA: NEGATIVE
RBC UA: NEGATIVE
SPEC GRAV UA: 1.01 (ref 1.010–1.025)
UROBILINOGEN UA: 0.2 U/dL
pH, UA: 8 (ref 5.0–8.0)

## 2018-05-04 NOTE — Progress Notes (Signed)
Pt is here for an ROB visit. 

## 2018-05-04 NOTE — Patient Instructions (Signed)
[facility]   [facility]  [address], [place], [place] [address]  Phone: [phone]   [facility] (second location)  [address] [place], [place] [address]  Phone: [phone]   [facility] ([place]) [address], [place], [place] [address] Phone: [phone]   [facility]  [address] [place], [place] [address]  Phone: [phone] a Baby Grows During Pregnancy Pregnancy begins when a female's sperm enters a female's egg (fertilization). This happens in one of the tubes (fallopian tubes) that connect the ovaries to the womb (uterus). The fertilized egg is called an embryo until it reaches 10 weeks. From 10 weeks until birth, it is called a fetus. The fertilized egg moves down the fallopian tube to the uterus. Then it implants into the lining of the uterus and begins to grow. The developing fetus receives oxygen and nutrients through the pregnant woman's bloodstream and the tissues that grow (placenta) to support the fetus. The placenta is the life support system for the fetus. It provides nutrition and removes waste. Learning as much as you can about your pregnancy and how your baby is developing can help you enjoy the experience. It can also make you aware of when there might be a problem and when to ask questions. How long does a typical pregnancy last? A pregnancy usually lasts 280 days, or about 40 weeks. Pregnancy is divided into three trimesters:  First trimester: 0-13 weeks.  Second trimester: 14-27 weeks.  Third trimester: 28-40 weeks.  The day when your baby is considered ready to be born (full term) is your estimated date of delivery. How does my baby develop month by month? First month  The fertilized egg attaches to the inside of the uterus.  Some cells will form the placenta. Others will form the fetus.  The arms, legs, brain, spinal cord, lungs, and heart begin to develop.  At  the end of the first month, the heart begins to beat.  Second month  The bones, inner ear, eyelids, hands, and feet form.  The genitals develop.  By the end of 8 weeks, all major organs are developing.  Third month  All of the internal organs are forming.  Teeth develop below the gums.  Bones and muscles begin to grow. The spine can flex.  The skin is transparent.  Fingernails and toenails begin to form.  Arms and legs continue to grow longer, and hands and feet develop.  The fetus is about 3 in (7.6 cm) long.  Fourth month  The placenta is completely formed.  The external sex organs, neck, outer ear, eyebrows, eyelids, and fingernails are formed.  The fetus can hear, swallow, and move its arms and legs.  The kidneys begin to produce urine.  The skin is covered with a white waxy coating (vernix) and very fine hair (lanugo).  Fifth month  The fetus moves around more and can be felt for the first time (quickening).  The fetus starts to sleep and wake up and may begin to suck its finger.  The nails grow to the end of the fingers.  The organ in the digestive system that makes bile (gallbladder) functions and helps to digest the nutrients.  If your baby is a girl, eggs are present in her ovaries. If your baby is a boy, testicles start to move down into his scrotum.  Sixth month  The lungs are formed, but the fetus is not yet able to breathe.    The eyes open. The brain continues to develop.  Your baby has fingerprints and toe prints. Your baby's hair grows thicker.  At the end of the second trimester, the fetus is about 9 in (22.9 cm) long.  Seventh month  The fetus kicks and stretches.  The eyes are developed enough to sense changes in light.  The hands can make a grasping motion.  The fetus responds to sound.  Eighth month  All organs and body systems are fully developed and functioning.  Bones harden and taste buds develop. The fetus may  hiccup.  Certain areas of the brain are still developing. The skull remains soft.  Ninth month  The fetus gains about  lb (0.23 kg) each week.  The lungs are fully developed.  Patterns of sleep develop.  The fetus's head typically moves into a head-down position (vertex) in the uterus to prepare for birth. If the buttocks move into a vertex position instead, the baby is breech.  The fetus weighs 6-9 lbs (2.72-4.08 kg) and is 19-20 in (48.26-50.8 cm) long.  What can I do to have a healthy pregnancy and help my baby develop? Eating and Drinking  Eat a healthy diet. ? Talk with your health care provider to make sure that you are getting the nutrients that you and your baby need. ? Visit www.choosemyplate.gov to learn about creating a healthy diet.  Gain a healthy amount of weight during pregnancy as advised by your health care provider. This is usually 25-35 pounds. You may need to: ? Gain more if you were underweight before getting pregnant or if you are pregnant with more than one baby. ? Gain less if you were overweight or obese when you got pregnant.  Medicines and Vitamins  Take prenatal vitamins as directed by your health care provider. These include vitamins such as folic acid, iron, calcium, and vitamin D. They are important for healthy development.  Take medicines only as directed by your health care provider. Read labels and ask a pharmacist or your health care provider whether over-the-counter medicines, supplements, and prescription drugs are safe to take during pregnancy.  Activities  Be physically active as advised by your health care provider. Ask your health care provider to recommend activities that are safe for you to do, such as walking or swimming.  Do not participate in strenuous or extreme sports.  Lifestyle  Do not drink alcohol.  Do not use any tobacco products, including cigarettes, chewing tobacco, or electronic cigarettes. If you need help quitting,  ask your health care provider.  Do not use illegal drugs.  Safety  Avoid exposure to mercury, lead, or other heavy metals. Ask your health care provider about common sources of these heavy metals.  Avoid listeria infection during pregnancy. Follow these precautions: ? Do not eat soft cheeses or deli meats. ? Do not eat hot dogs unless they have been warmed up to the point of steaming, such as in the microwave oven. ? Do not drink unpasteurized milk.  Avoid toxoplasmosis infection during pregnancy. Follow these precautions: ? Do not change your cat's litter box, if you have a cat. Ask someone else to do this for you. ? Wear gardening gloves while working in the yard.  General Instructions  Keep all follow-up visits as directed by your health care provider. This is important. This includes prenatal care and screening tests.  Manage any chronic health conditions. Work closely with your health care provider to keep conditions, such as diabetes, under control.    How do I know if my baby is developing well? At each prenatal visit, your health care provider will do several different tests to check on your health and keep track of your baby's development. These include:  Fundal height. ? Your health care provider will measure your growing belly from top to bottom using a tape measure. ? Your health care provider will also feel your belly to determine your baby's position.  Heartbeat. ? An ultrasound in the first trimester can confirm pregnancy and show a heartbeat, depending on how far along you are. ? Your health care provider will check your baby's heart rate at every prenatal visit. ? As you get closer to your delivery date, you may have regular fetal heart rate monitoring to make sure that your baby is not in distress.  Second trimester ultrasound. ? This ultrasound checks your baby's development. It also indicates your baby's gender.  What should I do if I have concerns about my  baby's development? Always talk with your health care provider about any concerns that you may have. This information is not intended to replace advice given to you by your health care provider. Make sure you discuss any questions you have with your health care provider. Document Released: 04/14/2008 Document Revised: 04/03/2016 Document Reviewed: 04/05/2014 Elsevier Interactive Patient Education  2018 Elsevier Inc.  

## 2018-05-04 NOTE — Progress Notes (Signed)
ROB doing well. No complaints . Feels good movement PTL precautions reviewed. Follow up 2 wks.   Doreene BurkeAnnie Mariko Nowakowski, CNM

## 2018-05-07 ENCOUNTER — Inpatient Hospital Stay: Payer: BLUE CROSS/BLUE SHIELD | Admitting: Anesthesiology

## 2018-05-07 ENCOUNTER — Inpatient Hospital Stay
Admission: EM | Admit: 2018-05-07 | Discharge: 2018-05-10 | DRG: 805 | Disposition: A | Discharge status: Home / Self Care | Payer: BLUE CROSS/BLUE SHIELD | Attending: Certified Nurse Midwife | Admitting: Certified Nurse Midwife

## 2018-05-07 ENCOUNTER — Other Ambulatory Visit: Payer: Self-pay

## 2018-05-07 DIAGNOSIS — D62 Acute posthemorrhagic anemia: Secondary | ICD-10-CM | POA: Diagnosis present

## 2018-05-07 DIAGNOSIS — Z3A33 33 weeks gestation of pregnancy: Secondary | ICD-10-CM | POA: Diagnosis not present

## 2018-05-07 DIAGNOSIS — Z3A32 32 weeks gestation of pregnancy: Secondary | ICD-10-CM | POA: Diagnosis not present

## 2018-05-07 DIAGNOSIS — O9081 Anemia of the puerperium: Secondary | ICD-10-CM | POA: Diagnosis not present

## 2018-05-07 LAB — TYPE AND SCREEN
ABO/RH(D): A POS
ANTIBODY SCREEN: NEGATIVE

## 2018-05-07 LAB — URINALYSIS, COMPLETE (UACMP) WITH MICROSCOPIC
BILIRUBIN URINE: NEGATIVE
Glucose, UA: NEGATIVE mg/dL
Ketones, ur: NEGATIVE mg/dL
NITRITE: NEGATIVE
PROTEIN: NEGATIVE mg/dL
Specific Gravity, Urine: 1.014 (ref 1.005–1.030)
pH: 7 (ref 5.0–8.0)

## 2018-05-07 LAB — CBC
HCT: 35.9 % (ref 35.0–47.0)
Hemoglobin: 12.1 g/dL (ref 12.0–16.0)
MCH: 30.4 pg (ref 26.0–34.0)
MCHC: 33.8 g/dL (ref 32.0–36.0)
MCV: 90 fL (ref 80.0–100.0)
PLATELETS: 283 10*3/uL (ref 150–440)
RBC: 3.99 MIL/uL (ref 3.80–5.20)
RDW: 13.6 % (ref 11.5–14.5)
WBC: 19.1 10*3/uL — AB (ref 3.6–11.0)

## 2018-05-07 LAB — CHLAMYDIA/NGC RT PCR (ARMC ONLY)
Chlamydia Tr: NOT DETECTED
N GONORRHOEAE: NOT DETECTED

## 2018-05-07 MED ORDER — ZOLPIDEM TARTRATE 5 MG PO TABS: 5.0000 mg | ORAL_TABLET | Freq: Every evening | ORAL | Status: DC | PRN

## 2018-05-07 MED ORDER — BETAMETHASONE SOD PHOS & ACET 6 (3-3) MG/ML IJ SUSP: 12.0000 mg | INTRAMUSCULAR | Status: DC

## 2018-05-07 MED ORDER — CALCIUM GLUCONATE 10 % IV SOLN
INTRAVENOUS | Status: AC
  Filled 2018-05-07: qty 10

## 2018-05-07 MED ORDER — EPHEDRINE 5 MG/ML INJ
10.0000 mg | INTRAVENOUS | Status: DC | PRN
  Filled 2018-05-07: qty 2

## 2018-05-07 MED ORDER — ACETAMINOPHEN 325 MG PO TABS
650.0000 mg | ORAL_TABLET | ORAL | Status: DC | PRN
  Filled 2018-05-07: qty 2

## 2018-05-07 MED ORDER — BUPIVACAINE HCL (PF) 0.25 % IJ SOLN
INTRAMUSCULAR | Status: DC | PRN
  Administered 2018-05-07: 10 mL via EPIDURAL

## 2018-05-07 MED ORDER — DIPHENHYDRAMINE HCL 50 MG/ML IJ SOLN: 12.5000 mg | INTRAMUSCULAR | Status: DC | PRN

## 2018-05-07 MED ORDER — PHENYLEPHRINE 40 MCG/ML (10ML) SYRINGE FOR IV PUSH (FOR BLOOD PRESSURE SUPPORT)
80.0000 ug | PREFILLED_SYRINGE | INTRAVENOUS | Status: DC | PRN
  Filled 2018-05-07: qty 5

## 2018-05-07 MED ORDER — LACTATED RINGERS IV SOLN
500.0000 mL | INTRAVENOUS | Status: DC | PRN
  Administered 2018-05-07: 1000 mL via INTRAVENOUS

## 2018-05-07 MED ORDER — LIDOCAINE HCL (PF) 2 % IJ SOLN: INTRAMUSCULAR | Status: DC | PRN

## 2018-05-07 MED ORDER — OXYTOCIN 40 UNITS IN LACTATED RINGERS INFUSION - SIMPLE MED
INTRAVENOUS | Status: AC
  Filled 2018-05-07: qty 1000

## 2018-05-07 MED ORDER — PRENATAL MULTIVITAMIN CH
1.0000 | ORAL_TABLET | Freq: Every day | ORAL | Status: DC
  Administered 2018-05-07: 1 via ORAL
  Filled 2018-05-07: qty 1

## 2018-05-07 MED ORDER — BETAMETHASONE SOD PHOS & ACET 6 (3-3) MG/ML IJ SUSP
INTRAMUSCULAR | Status: AC
  Administered 2018-05-07: 12 mg via INTRAMUSCULAR
  Filled 2018-05-07: qty 1

## 2018-05-07 MED ORDER — OXYTOCIN 10 UNIT/ML IJ SOLN
INTRAMUSCULAR | Status: AC
  Filled 2018-05-07: qty 2

## 2018-05-07 MED ORDER — LIDOCAINE HCL (PF) 1 % IJ SOLN
30.0000 mL | INTRAMUSCULAR | Status: DC | PRN
  Filled 2018-05-07: qty 30

## 2018-05-07 MED ORDER — LIDOCAINE HCL (PF) 2 % IJ SOLN
INTRAMUSCULAR | Status: DC | PRN
  Administered 2018-05-07: 5 mL via INTRADERMAL

## 2018-05-07 MED ORDER — SODIUM CHLORIDE 0.9 % IV SOLN
5.0000 10*6.[IU] | Freq: Once | INTRAVENOUS | Status: AC
  Administered 2018-05-07: 5 10*6.[IU] via INTRAVENOUS
  Filled 2018-05-07: qty 5

## 2018-05-07 MED ORDER — SOD CITRATE-CITRIC ACID 500-334 MG/5ML PO SOLN: 30.0000 mL | ORAL | Status: DC | PRN

## 2018-05-07 MED ORDER — PENICILLIN G POT IN DEXTROSE 60000 UNIT/ML IV SOLN
3.0000 10*6.[IU] | INTRAVENOUS | Status: DC
  Administered 2018-05-07 – 2018-05-08 (×3): 3 10*6.[IU] via INTRAVENOUS
  Filled 2018-05-07 (×8): qty 50

## 2018-05-07 MED ORDER — MAGNESIUM SULFATE BOLUS VIA INFUSION
6.0000 g | Freq: Once | INTRAVENOUS | Status: AC
  Administered 2018-05-07: 6 g via INTRAVENOUS
  Filled 2018-05-07: qty 500

## 2018-05-07 MED ORDER — AMMONIA AROMATIC IN INHA
RESPIRATORY_TRACT | Status: AC
  Filled 2018-05-07: qty 10

## 2018-05-07 MED ORDER — OXYCODONE-ACETAMINOPHEN 5-325 MG PO TABS: 1.0000 | ORAL_TABLET | ORAL | Status: DC | PRN

## 2018-05-07 MED ORDER — CALCIUM CARBONATE ANTACID 500 MG PO CHEW: 2.0000 | CHEWABLE_TABLET | ORAL | Status: DC | PRN

## 2018-05-07 MED ORDER — OXYTOCIN BOLUS FROM INFUSION
500.0000 mL | Freq: Once | INTRAVENOUS | Status: AC
  Administered 2018-05-08: 500 mL via INTRAVENOUS

## 2018-05-07 MED ORDER — BETAMETHASONE SOD PHOS & ACET 6 (3-3) MG/ML IJ SUSP
12.0000 mg | Freq: Two times a day (BID) | INTRAMUSCULAR | Status: DC
  Administered 2018-05-07 (×2): 12 mg via INTRAMUSCULAR
  Filled 2018-05-07: qty 2

## 2018-05-07 MED ORDER — MISOPROSTOL 200 MCG PO TABS
ORAL_TABLET | ORAL | Status: AC
  Filled 2018-05-07: qty 5

## 2018-05-07 MED ORDER — TERBUTALINE SULFATE 1 MG/ML IJ SOLN
0.2500 mg | Freq: Once | INTRAMUSCULAR | Status: AC | PRN
  Administered 2018-05-07: 0.25 mg via SUBCUTANEOUS
  Filled 2018-05-07: qty 1

## 2018-05-07 MED ORDER — MISOPROSTOL 200 MCG PO TABS
ORAL_TABLET | ORAL | Status: AC
  Filled 2018-05-07: qty 4

## 2018-05-07 MED ORDER — MAGNESIUM SULFATE 40 G IN LACTATED RINGERS - SIMPLE
2.0000 g/h | INTRAVENOUS | Status: DC
  Administered 2018-05-07: 2 g/h via INTRAVENOUS
  Filled 2018-05-07 (×2): qty 500

## 2018-05-07 MED ORDER — BUTORPHANOL TARTRATE 1 MG/ML IJ SOLN: 1.0000 mg | INTRAMUSCULAR | Status: DC | PRN

## 2018-05-07 MED ORDER — OXYTOCIN 40 UNITS IN LACTATED RINGERS INFUSION - SIMPLE MED
2.5000 [IU]/h | INTRAVENOUS | Status: DC
  Filled 2018-05-07: qty 1000

## 2018-05-07 MED ORDER — LIDOCAINE HCL (PF) 1 % IJ SOLN
INTRAMUSCULAR | Status: DC | PRN
  Administered 2018-05-07: 3 mL

## 2018-05-07 MED ORDER — LIDOCAINE HCL (PF) 1 % IJ SOLN
INTRAMUSCULAR | Status: AC
  Filled 2018-05-07: qty 30

## 2018-05-07 MED ORDER — OXYCODONE-ACETAMINOPHEN 5-325 MG PO TABS: 2.0000 | ORAL_TABLET | ORAL | Status: DC | PRN

## 2018-05-07 MED ORDER — DOCUSATE SODIUM 100 MG PO CAPS
100.0000 mg | ORAL_CAPSULE | Freq: Every day | ORAL | Status: DC
  Administered 2018-05-07: 100 mg via ORAL
  Filled 2018-05-07: qty 1

## 2018-05-07 MED ORDER — ONDANSETRON HCL 4 MG/2ML IJ SOLN: 4.0000 mg | Freq: Four times a day (QID) | INTRAMUSCULAR | Status: DC | PRN

## 2018-05-07 MED ORDER — FENTANYL 2.5 MCG/ML W/ROPIVACAINE 0.15% IN NS 100 ML EPIDURAL (ARMC)
EPIDURAL | Status: DC | PRN
  Administered 2018-05-07: 12 mL/h via EPIDURAL

## 2018-05-07 MED ORDER — LIDOCAINE-EPINEPHRINE (PF) 1.5 %-1:200000 IJ SOLN
INTRAMUSCULAR | Status: DC | PRN
  Administered 2018-05-07: 3 mL via PERINEURAL

## 2018-05-07 MED ORDER — FENTANYL 2.5 MCG/ML W/ROPIVACAINE 0.15% IN NS 100 ML EPIDURAL (ARMC)
EPIDURAL | Status: AC
  Filled 2018-05-07: qty 100

## 2018-05-07 MED ORDER — FENTANYL 2.5 MCG/ML W/ROPIVACAINE 0.15% IN NS 100 ML EPIDURAL (ARMC): 12.0000 mL/h | EPIDURAL | Status: DC

## 2018-05-07 MED ORDER — LACTATED RINGERS IV SOLN
INTRAVENOUS | Status: DC
  Administered 2018-05-07 (×2): via INTRAVENOUS

## 2018-05-07 MED ORDER — LACTATED RINGERS IV SOLN: 500.0000 mL | Freq: Once | INTRAVENOUS | Status: DC

## 2018-05-07 NOTE — H&P (Signed)
Obstetric History and Physical  Bonnie Graham is a 22 y.o. G1P0000 with IUP at [redacted]w[redacted]d presenting with painful uterine contraction and bloody show. Patient states she has been having  regular, every five minutes contractions since 0600, minimal vaginal bleeding, intact membranes, with active fetal movement.    Denies difficulty breathing or respiratory distress, chest pain, dysuria, and leg pain or swelling.   Prenatal Course  Source of Care: [facility]-initial visit at 6 weeks, total visits: 9  Pregnancy complications or risks: Varicella non-immune  Prenatal labs and studies:  ABO, Rh: --/--/A POS (06/28 0926)  Antibody: NEG (06/28 0926)  Rubella: 1.02 (12/28 1548)  Varicella: Non immune  RPR: Non Reactive (05/28 0935)   HBsAg: Negative (12/28 1548)   HIV: Non Reactive (12/28 1548)   GBS: Unknown, collected  1 hr Glucola: 75 (05/28 0935)  Genetic screening: declined  Anatomy US: Complete, normal, female (04/01 1500)  Past Medical History:  Diagnosis Date  . Heavy periods   . Painful menstrual periods     Past Surgical History:  Procedure Laterality Date  . NO PAST SURGERIES      OB History  Gravida Para Term Preterm AB Living  1 0 0 0 0 0  SAB TAB Ectopic Multiple Live Births  0 0 0 0 0    # Outcome Date GA Lbr Len/2nd Weight Sex Delivery Anes PTL Lv  1 Current             Social History   Socioeconomic History  . Marital status: Single    Spouse name: Not on file  . Number of children: Not on file  . Years of education: Not on file  . Highest education level: Not on file  Occupational History  . Not on file  Social Needs  . Financial resource strain: Not on file  . Food insecurity:    Worry: Not on file    Inability: Not on file  . Transportation needs:    Medical: Not on file    Non-medical: Not on file  Tobacco Use  . Smoking status: Never Smoker  . Smokeless tobacco: Never Used  Substance and Sexual Activity  . Alcohol use: No  . Drug  use: No  . Sexual activity: Yes    Birth control/protection: None  Lifestyle  . Physical activity:    Days per week: Not on file    Minutes per session: Not on file  . Stress: Not on file  Relationships  . Social connections:    Talks on phone: Not on file    Gets together: Not on file    Attends religious service: Not on file    Active member of club or organization: Not on file    Attends meetings of clubs or organizations: Not on file    Relationship status: Not on file  Other Topics Concern  . Not on file  Social History Narrative  . Not on file    Family History  Problem Relation Age of Onset  . Heart disease Father   . Endometriosis Sister   . Endometriosis Maternal Grandmother        grt  . Breast cancer Neg Hx   . Ovarian cancer Neg Hx   . Colon cancer Neg Hx   . Diabetes Neg Hx     Medications Prior to Admission  Medication Sig Dispense Refill Last Dose  . Prenatal Vit-Fe Fumarate-FA (PRENATAL MULTIVITAMIN) TABS tablet Take 1 tablet by mouth daily at 12 noon.  Taking  . magnesium oxide (MAG-OX) 400 MG tablet Take 1 tablet (400 mg total) by mouth 2 (two) times daily. 60 tablet 1 Taking    No Known Allergies  Review of Systems: Negative except for what is mentioned in HPI.  Physical Exam:  BP 123/71 (BP Location: Right Arm)   Pulse 100   Temp 98.2 F (36.8 C)   Resp 18   Ht 5\' 3"  (1.6 m)   Wt 173 lb (78.5 kg)   LMP 09/19/2017   BMI 30.65 kg/m    GENERAL: Well-developed, well-nourished female in no acute distress.   NEUROLGIC: Alert and oriented to person, place, and time. Normal reflexes, muscle tone coordination. No cranial nerve deficit noted.  PSYCHIATRIC: Normal mood and affect. Normal behavior. Normal judgment and thought content.LUNGS: Clear to auscultation bilaterally.   HEART: Regular rate and rhythm.  ABDOMEN: Soft, nontender, nondistended, gravid.  MUSCULOSKELETAL: Normal range of motion. No edema and no tenderness. 2+ distal  pulses.  Cervical Exam: Dilation: 5 Effacement (%): 80 Presentation: Undeterminable Exam by:: Willodean Rosenthal, CNM  FHT:  Baseline rate 140 bpm   Variability moderate  Accelerations present   Decelerations none  Contractions: Every three (3) to six (6) minutes, soft resting tone    Pertinent Labs/Studies:    Results for orders placed or performed during the hospital encounter of 05/07/18 (from the past 24 hour(s))  Urinalysis, Complete w Microscopic     Status: Abnormal   Collection Time: 05/07/18  9:18 AM  Result Value Ref Range   Color, Urine YELLOW (A) YELLOW   APPearance HAZY (A) CLEAR   Specific Gravity, Urine 1.014 1.005 - 1.030   pH 7.0 5.0 - 8.0   Glucose, UA NEGATIVE NEGATIVE mg/dL   Hgb urine dipstick MODERATE (A) NEGATIVE   Bilirubin Urine NEGATIVE NEGATIVE   Ketones, ur NEGATIVE NEGATIVE mg/dL   Protein, ur NEGATIVE NEGATIVE mg/dL   Nitrite NEGATIVE NEGATIVE   Leukocytes, UA LARGE (A) NEGATIVE   RBC / HPF 0-5 0 - 5 RBC/hpf   WBC, UA 21-50 0 - 5 WBC/hpf   Bacteria, UA RARE (A) NONE SEEN   Squamous Epithelial / LPF 0-5 0 - 5   Mucus PRESENT   Type and screen [facility]     Status: None   Collection Time: 05/07/18  9:26 AM  Result Value Ref Range   ABO/RH(D) A POS    Antibody Screen NEG    Sample Expiration      05/10/2018 Performed at [facility], [address] [place], [place] [address]   CBC on admission     Status: Abnormal   Collection Time: 05/07/18  9:26 AM  Result Value Ref Range   WBC 19.1 (H) 3.6 - 11.0 K/uL   RBC 3.99 3.80 - 5.20 MIL/uL   Hemoglobin 12.1 12.0 - 16.0 g/dL   HCT 60.4 54.0 - 98.1 %   MCV 90.0 80.0 - 100.0 fL   MCH 30.4 26.0 - 34.0 pg   MCHC 33.8 32.0 - 36.0 g/dL   RDW 19.1 47.8 - 29.5 %   Platelets 283 150 - 440 K/uL    Assessment :  Bonnie Graham Bonnie Graham is a 22 y.o. G1P0000 at [redacted]w[redacted]d admitted for preterm labor, Rh positive, varicella non-immune, GBS unknown  FHR Category  I  Plan:  Admit to birthing suites. Collect labs.   Vertex presentation verified by bedside ultrasound.   Magnesium for neuroprotection Penicillin for GBS prophylaxis. Betamethasone for lung  maturity.   Neonatology consult. SCN notified.   Reviewed red flag symptoms and when to call.   Reassess as needed.   Dr. Logan BoresEvans notified of patient admission and plan of care.    Gunnar BullaJenkins Michelle Coco Sharpnack, CNM [facility], [facility]

## 2018-05-07 NOTE — Consult Note (Signed)
Asked by Bonnie Graham, Bonnie Graham/Bonnie Graham to consult on Bonnie Graham for preterm labor. Bonnie Bonnie Graham is a 22 yo G1P0 at 7432 6/7 weeks in active labor, intact membranes. Prenatal labs are neg with unknown GBS status. Prenatal US is normal, female. She has received betamethasone #1, terb, and is currently receiving magnesium sulfate for neuroprotection.  I spoke to Bonnie Bonnie Graham in L&D in presence of the baby's father and Bonnie Bonnie Graham. I discussed the most common medical complications encountered around this gestation. I talked about RDS in detail and its treatment with various modes of resp support including vent and surfactant. I also talked about GI/developmental immaturity requiring IV nutrition and gavage feeding. She is interested in breastfeeding. I discussed benefits of breast milk for the baby and offered donor milk supplementation until her supply is sufficient. I discussed general care such as IV fluids, isolette, and umbilical line.  In conversation with Bonnie Graham, Bonnie Graham, I suggested giving Pen G for prophylaxis as her GBS status is unknown. May d/c if PCR comes back neg. I also suggested accelerated betamethasone dose administration as delivery appears inevitable.  Thank you for inviting me to participate in this patients care.  Lucillie Garfinkelita Q Melba Araki MD Neonatologist

## 2018-05-07 NOTE — Progress Notes (Signed)
Patient ID: Bonnie Graham, female   DOB: 03/13/1996, 22 y.o.   MRN: [id number]  Bonnie Graham is a 22 y.o. G1P0000 at 387w6d by ultrasound admitted for Preterm labor  Subjective:  Reports increased frequency of contractions and vaginal fullness. Family members at bedside.   Denies difficulty breathing or respiratory distress, chest pain, and leg pain or swelling.   Objective:  Temp:  [97.8 F (36.6 C)-98.4 F (36.9 C)] 97.8 F (36.6 C) (06/28 1333) Pulse Rate:  [100-112] 112 (06/28 1433) Resp:  [16-18] 18 (06/28 1433) BP: (89-123)/(49-71) 89/57 (06/28 1433) SpO2:  [95 %-100 %] 97 % (06/28 1340) Weight:  [173 lb (78.5 kg)] 173 lb (78.5 kg) (06/28 0801)   Intake/Output Summary (Last 24 hours) at 05/07/2018 1629 Last data filed at 05/07/2018 1623 Gross per 24 hour  Intake 1324.58 ml  Output 1600 ml  Net -275.42 ml   Fetal Wellbeing:  Category I  UC:   regular, every five (5) to six (6) minutes, soft resting tone  SVE:   Dilation: 7 Effacement (%): 100 Station: Plus 1 Exam by:: Willodean RosenthalM. Daniele Dillow, CNM  Labs: Lab Results  Component Value Date   WBC 19.1 (H) 05/07/2018   HGB 12.1 05/07/2018   HCT 35.9 05/07/2018   MCV 90.0 05/07/2018   PLT 283 05/07/2018    Assessment:  Bonnie Graham is a 22 y.o. G1P0000 at 617w6d admitted for preterm labor, Rh positive, varicella non-immune, GBS unknown  FHR Category I  Plan:  Will contact anesthesia regarding possibility of side lying epidural placement.   Reviewed red flag symptoms and when to call.   Continue orders as written. Reassess as needed.    Gunnar BullaJenkins Michelle Briannie Gutierrez, CNM [facility], [facility] 05/07/2018, 4:28 PM

## 2018-05-07 NOTE — Plan of Care (Signed)
Neonatologist has seen pt and discussed expectations of preterm delivery. Epidural in place for pain relief

## 2018-05-07 NOTE — Anesthesia Procedure Notes (Signed)
Epidural Patient location during procedure: OB Start time: 05/07/2018 4:57 PM End time: 05/07/2018 5:06 PM  Staffing Anesthesiologist: Yves Dillarroll, Julia Alkhatib, MD Performed: anesthesiologist   Preanesthetic Checklist Completed: patient identified, site marked, surgical consent, pre-op evaluation, timeout performed, IV checked, risks and benefits discussed and monitors and equipment checked  Epidural Patient position: sitting Prep: Betadine Patient monitoring: heart rate, continuous pulse ox and blood pressure Approach: midline Location: L4-L5 Injection technique: LOR air  Needle:  Needle type: Tuohy  Needle gauge: 17 G Needle length: 9 cm and 9 Catheter type: closed end flexible Catheter size: 19 Gauge Test dose: negative and 1.5% lidocaine with Epi 1:200 K  Assessment Events: blood not aspirated, injection not painful, no injection resistance, negative IV test and no paresthesia  Additional Notes Time out called.  Patient placed in left lateral position.  The back was prepped in sterile fashion and a skin wheal was made in the L4-L5 interspace with 1% Lidocaine plain.  A 17G Tuohy needle was advanced to the epidural space by a loss of resistance technique.  The epidural catheter was threaded 3 cm into the space and the TD was negative.  The catheter was affixed to the back in sterile fashion and the patient tolerated the procedure well.  Reason for block:procedure for pain

## 2018-05-07 NOTE — OB Triage Note (Signed)
Bonnie Graham here with c/o ctx since 0640, approx. 5-10 min apart, reports "bloody mucous", denies LOF, reports positive fetal movement.

## 2018-05-07 NOTE — Progress Notes (Signed)
Patient ID: Belenda CruiseHannah Grace Coble, female   DOB: 10/05/1996, 22 y.o.   MRN: [id number]  Belenda CruiseHannah Grace Coble is a 22 y.o. G1P0000 at 2726w6d by ultrasound admitted for Preterm labor  Subjective:  Reports pain relief since epidural placement. Endorses intermittent pelvic pressure with contractions. Family members at bedside.   Denies difficulty breathing or respiratory distress, chest pain, abdominal pain, and leg pain or swelling.   Objective:  Temp:  [97.8 F (36.6 C)-98.8 F (37.1 C)] 98.5 F (36.9 C) (06/28 1918) Pulse Rate:  [89-236] 96 (06/28 2120) Resp:  [16-18] 18 (06/28 1747) BP: (74-123)/(31-74) 109/53 (06/28 2120) SpO2:  [95 %-100 %] 98 % (06/28 2125) Weight:  [173 lb (78.5 kg)] 173 lb (78.5 kg) (06/28 0801)  I/O last 3 completed shifts: In: 2712.1 [I.V.:2462.1; IV Piggyback:250] Out: 1850 [Urine:1850] Total I/O In: 286.7 [I.V.:150; IV Piggyback:136.7] Out: -   Fetal Wellbeing:  Category II  UC:   regular, every three (3) to five (5) minutes, soft resting tone  SVE:   Dilation: 7 Effacement (%): 100 Station: Plus 1 Exam by:: Willodean RosenthalM. Deyton Ellenbecker, CNM  Labs: Lab Results  Component Value Date   WBC 19.1 (H) 05/07/2018   HGB 12.1 05/07/2018   HCT 35.9 05/07/2018   MCV 90.0 05/07/2018   PLT 283 05/07/2018    Assessment:  Jomarie LongsHannah Grace CobleHannah Grace Cobleis a 22 y.o.G1P0000 at 7426w6d admitted for preterm labor, Rh positive, varicella non-immune, GBS unknown  FHR Category II  Plan:  Per NNP will allow patient to rest and betamethasone to work until imminent delivery.   Repositioned and placed on peanut ball.   Continue orders as written. Reassess as needed.    Gunnar BullaJenkins Michelle Lyanna Blystone, CNM [facility], [facility] 05/07/2018, 9:40 PM

## 2018-05-07 NOTE — Anesthesia Preprocedure Evaluation (Signed)
Anesthesia Evaluation  Patient identified by MRN, date of birth, ID band Patient awake    Reviewed: Allergy & Precautions, NPO status , Patient's Chart, lab work & pertinent test results  Airway Mallampati: II  TM Distance: >3 FB     Dental   Pulmonary neg pulmonary ROS,    Pulmonary exam normal        Cardiovascular negative cardio ROS Normal cardiovascular exam     Neuro/Psych negative neurological ROS  negative psych ROS   GI/Hepatic negative GI ROS, Neg liver ROS,   Endo/Other  negative endocrine ROS  Renal/GU negative Renal ROS  negative genitourinary   Musculoskeletal negative musculoskeletal ROS (+)   Abdominal Normal abdominal exam  (+)   Peds negative pediatric ROS (+)  Hematology negative hematology ROS (+)   Anesthesia Other Findings   Reproductive/Obstetrics                             Anesthesia Physical Anesthesia Plan  ASA: II  Anesthesia Plan: Epidural   Post-op Pain Management:    Induction:   PONV Risk Score and Plan:   Airway Management Planned: Natural Airway  Additional Equipment:   Intra-op Plan:   Post-operative Plan:   Informed Consent:   Dental advisory given  Plan Discussed with: CRNA and Surgeon  Anesthesia Plan Comments:         Anesthesia Quick Evaluation

## 2018-05-08 DIAGNOSIS — Z3A32 32 weeks gestation of pregnancy: Secondary | ICD-10-CM

## 2018-05-08 LAB — CBC
HEMATOCRIT: 32.2 % — AB (ref 35.0–47.0)
Hemoglobin: 10.7 g/dL — ABNORMAL LOW (ref 12.0–16.0)
MCH: 29.8 pg (ref 26.0–34.0)
MCHC: 33.3 g/dL (ref 32.0–36.0)
MCV: 89.5 fL (ref 80.0–100.0)
Platelets: 294 10*3/uL (ref 150–440)
RBC: 3.59 MIL/uL — ABNORMAL LOW (ref 3.80–5.20)
RDW: 13.6 % (ref 11.5–14.5)
WBC: 39.5 10*3/uL — ABNORMAL HIGH (ref 3.6–11.0)

## 2018-05-08 LAB — URINE CULTURE

## 2018-05-08 LAB — RPR: RPR Ser Ql: NONREACTIVE

## 2018-05-08 MED ORDER — IBUPROFEN 600 MG PO TABS
600.0000 mg | ORAL_TABLET | Freq: Four times a day (QID) | ORAL | Status: DC
  Administered 2018-05-08 – 2018-05-10 (×6): 600 mg via ORAL
  Filled 2018-05-08 (×6): qty 1

## 2018-05-08 MED ORDER — SODIUM CHLORIDE 0.9% FLUSH: 3.0000 mL | INTRAVENOUS | Status: DC | PRN

## 2018-05-08 MED ORDER — WITCH HAZEL-GLYCERIN EX PADS: 1.0000 "application " | MEDICATED_PAD | CUTANEOUS | Status: DC | PRN

## 2018-05-08 MED ORDER — PRENATAL MULTIVITAMIN CH
1.0000 | ORAL_TABLET | Freq: Every day | ORAL | Status: DC
  Administered 2018-05-08 – 2018-05-10 (×3): 1 via ORAL
  Filled 2018-05-08 (×3): qty 1

## 2018-05-08 MED ORDER — ONDANSETRON HCL 4 MG PO TABS: 4.0000 mg | ORAL_TABLET | ORAL | Status: DC | PRN

## 2018-05-08 MED ORDER — SODIUM CHLORIDE 0.9% FLUSH: 3.0000 mL | Freq: Two times a day (BID) | INTRAVENOUS | Status: DC

## 2018-05-08 MED ORDER — MAGNESIUM OXIDE 400 (241.3 MG) MG PO TABS
400.0000 mg | ORAL_TABLET | Freq: Every day | ORAL | Status: DC
  Administered 2018-05-08 – 2018-05-10 (×3): 400 mg via ORAL
  Filled 2018-05-08 (×4): qty 1

## 2018-05-08 MED ORDER — ONDANSETRON HCL 4 MG/2ML IJ SOLN: 4.0000 mg | INTRAMUSCULAR | Status: DC | PRN

## 2018-05-08 MED ORDER — BENZOCAINE-MENTHOL 20-0.5 % EX AERO
1.0000 "application " | INHALATION_SPRAY | CUTANEOUS | Status: DC | PRN
  Administered 2018-05-10: 1 via TOPICAL
  Filled 2018-05-08: qty 56

## 2018-05-08 MED ORDER — COCONUT OIL OIL
1.0000 "application " | TOPICAL_OIL | Status: DC | PRN
  Filled 2018-05-08: qty 120

## 2018-05-08 MED ORDER — VARICELLA VIRUS VACCINE LIVE 1350 PFU/0.5ML IJ SUSR
0.5000 mL | Freq: Once | INTRAMUSCULAR | Status: DC
  Filled 2018-05-08: qty 0.5

## 2018-05-08 MED ORDER — METHYLERGONOVINE MALEATE 0.2 MG PO TABS: 0.2000 mg | ORAL_TABLET | ORAL | Status: DC | PRN

## 2018-05-08 MED ORDER — FERROUS SULFATE 325 (65 FE) MG PO TABS
325.0000 mg | ORAL_TABLET | Freq: Three times a day (TID) | ORAL | Status: DC
  Administered 2018-05-09 – 2018-05-10 (×5): 325 mg via ORAL
  Filled 2018-05-08 (×5): qty 1

## 2018-05-08 MED ORDER — DIBUCAINE 1 % RE OINT: 1.0000 "application " | TOPICAL_OINTMENT | RECTAL | Status: DC | PRN

## 2018-05-08 MED ORDER — SODIUM CHLORIDE 0.9 % IV SOLN: 250.0000 mL | INTRAVENOUS | Status: DC | PRN

## 2018-05-08 MED ORDER — METHYLERGONOVINE MALEATE 0.2 MG/ML IJ SOLN: 0.2000 mg | INTRAMUSCULAR | Status: DC | PRN

## 2018-05-08 MED ORDER — ACETAMINOPHEN 325 MG PO TABS
650.0000 mg | ORAL_TABLET | ORAL | Status: DC | PRN
  Administered 2018-05-08 – 2018-05-10 (×8): 650 mg via ORAL
  Filled 2018-05-08 (×7): qty 2

## 2018-05-08 MED ORDER — SIMETHICONE 80 MG PO CHEW: 80.0000 mg | CHEWABLE_TABLET | ORAL | Status: DC | PRN

## 2018-05-08 MED ORDER — DIPHENHYDRAMINE HCL 25 MG PO CAPS: 25.0000 mg | ORAL_CAPSULE | Freq: Four times a day (QID) | ORAL | Status: DC | PRN

## 2018-05-08 MED ORDER — SENNOSIDES-DOCUSATE SODIUM 8.6-50 MG PO TABS
2.0000 | ORAL_TABLET | ORAL | Status: DC
  Administered 2018-05-09 – 2018-05-10 (×2): 2 via ORAL
  Filled 2018-05-08 (×3): qty 2

## 2018-05-08 NOTE — Lactation Note (Signed)
This note was copied from a baby's chart. Lactation Consultation Note  Patient Name: Bonnie Mordecai RasmussenHannah Coble UVOZD'GToday's Date: 05/08/2018 Reason for consult: Initial assessment;Primapara;Mother's request;NICU baby;Infant < 6lbs;Preterm <34wks Set up Symphony [facility].  Demonstrated warmth, massage and hand expression of colostrum.  Reviewed pumping, collection, storage, cleaning, labeling and handling of breast milk.  Mom expressed 7 to 9 ml.  Praised mom for commitment to supply breast milk for her baby in SCN.  Reviewed supply and demand and need to pump every 2 to 3 hours to bring in mature milk and ensure a plentiful milk supply.  Discussed normal course of lactation.  Encouraged mom to call lactation for assistance or if have any questions or concerns.  Maternal Data Formula Feeding for Exclusion: No Has patient been taught Hand Expression?: Yes(Can easily hand express colostrum) Does the patient have breastfeeding experience prior to this delivery?: No  Feeding    LATCH Score                   Interventions Interventions: Hand express;Coconut oil;DEBP;Expressed milk  Lactation Tools Discussed/Used Tools: Pump;Flanges;Coconut oil Flange Size: 24;27 Breast pump type: Double-Electric Breast Pump WIC Program: Yes(Mom has [employer] & Baby will be MCD) Pump Review: Setup, frequency, and cleaning;Milk Storage;Other (comment) Initiated by:: S.Anedra Penafiel,RN,BSN,IBCLC Date initiated:: 05/08/18   Consult Status Consult Status: Follow-up Date: 05/08/18 Follow-up type: Call as needed    [name], [name] 05/08/2018, 2:32 PM

## 2018-05-08 NOTE — Progress Notes (Signed)
Patient ID: Bonnie Graham, female   DOB: 12/15/1995, 22 y.o.   MRN: [id number]  Post Partum Day # 1/2, s/p SVD  Subjective:  Doing well, no questions or concerns. Pumping well. Infant in SCN. Family members and visitors at bedside.   Denies difficulty breathing or respiratory distress, chest pain, abdominal pain, excessive vaginal bleeding, dysuria, and leg pain or swelling.   Objective:  Temp:  [98 F (36.7 C)-99.3 F (37.4 C)] 98.4 F (36.9 C) (06/29 1609) Pulse Rate:  [76-138] 76 (06/29 1609) Resp:  [18-20] 18 (06/29 1609) BP: (84-111)/(48-74) 96/51 (06/29 1609) SpO2:  [95 %-100 %] 98 % (06/29 1609)  Physical Exam:   General: alert and cooperative   Lungs: clear to auscultation bilaterally  Breasts: no examined  Heart: normal apical impulse  Abdomen: soft, non-tender; bowel sounds normal; no masses,  no organomegaly  Pelvis: Lochia: appropriate,  Uterine Fundus: firm  Extremities: DVT Evaluation: No evidence of DVT seen on physical exam.  Recent Labs    05/07/18 0926 05/08/18 0648  HGB 12.1 10.7*  HCT 35.9 32.2*    Assessment:  Bonnie LongsHannah Grace CobleHannah Grace Cobleis a 22 y.o.G1P0101, status post spontaneous vaginal birth at 33 weeks, Rh positive, varicella non-immune, blood loss anemia, pumping-infant in SCN  Plan:  Iron and magnesium supplement, see orders.   Reviewed red flag symptoms and when to call.   Repeat CBC in the am.   Continue orders as written. Reassess as needed.     LOS: 1 day    Bonnie Graham, CNM [facility] 05/08/2018 7:19 PM

## 2018-05-09 LAB — CBC
HCT: 29.8 % — ABNORMAL LOW (ref 35.0–47.0)
Hemoglobin: 10.1 g/dL — ABNORMAL LOW (ref 12.0–16.0)
MCH: 30.4 pg (ref 26.0–34.0)
MCHC: 33.9 g/dL (ref 32.0–36.0)
MCV: 89.6 fL (ref 80.0–100.0)
PLATELETS: 270 10*3/uL (ref 150–440)
RBC: 3.33 MIL/uL — AB (ref 3.80–5.20)
RDW: 13.7 % (ref 11.5–14.5)
WBC: 24.7 10*3/uL — AB (ref 3.6–11.0)

## 2018-05-09 LAB — CULTURE, BETA STREP (GROUP B ONLY)

## 2018-05-09 MED ORDER — HYDROMORPHONE HCL 2 MG PO TABS
1.0000 mg | ORAL_TABLET | Freq: Once | ORAL | Status: AC
  Administered 2018-05-09: 1 mg via ORAL
  Filled 2018-05-09: qty 1

## 2018-05-09 NOTE — Progress Notes (Signed)
Patient ID: Bonnie Graham, female   DOB: 07/26/1996, 22 y.o.   MRN: [id number]  Post Partum Day # 1, s/p SVD  Subjective:  Doing well, reports less abdominal cramping. Sitting in chair in SCN visiting infant.   Denies difficulty breathing or respiratory distress, chest pain, excessive vaginal bleeding, dysuria, and leg pain or swelling.   Objective: Temp:  [98.1 F (36.7 C)-98.4 F (36.9 C)] 98.2 F (36.8 C) (06/30 0838) Pulse Rate:  [67-76] 68 (06/30 0838) Resp:  [18-20] 20 (06/30 0838) BP: (96-116)/(51-75) 105/74 (06/30 0838) SpO2:  [96 %-98 %] 97 % (06/30 10270838)  Physical Exam:   General: alert and cooperative   Lungs: clear to auscultation bilaterally  Breasts: deferred, no complaints  Heart: normal apical impulse  Extremities: DVT Evaluation: No evidence of DVT seen on physical exam.  Recent Labs    05/08/18 0648 05/09/18 0824  HGB 10.7* 10.1*  HCT 32.2* 29.8*    Assessment/Plan: Plan for discharge tomorrow   LOS: 2 days   Gunnar BullaLawhorn, Jenkins Michelle, CNM [facility] 05/09/2018 3:26 PM

## 2018-05-09 NOTE — Anesthesia Postprocedure Evaluation (Signed)
Anesthesia Post Note  Patient: Bonnie Graham  Procedure(s) Performed: AN AD HOC LABOR EPIDURAL  Patient location during evaluation: Mother Baby Anesthesia Type: Epidural Level of consciousness: awake and alert Pain management: pain level controlled Vital Signs Assessment: post-procedure vital signs reviewed and stable Respiratory status: spontaneous breathing, nonlabored ventilation and respiratory function stable Cardiovascular status: stable Postop Assessment: no headache, no backache, able to ambulate and adequate PO intake Anesthetic complications: no     Last Vitals:  Vitals:   05/09/18 0838 05/09/18 1613  BP: 105/74 106/66  Pulse: 68 79  Resp: 20 18  Temp: 36.8 C 37.1 C  SpO2: 97% 98%    Last Pain:  Vitals:   05/09/18 1628  TempSrc:   PainSc: 0-No pain                 Lenard SimmerAndrew Daphine Loch

## 2018-05-10 MED ORDER — FERROUS SULFATE 325 (65 FE) MG PO TABS: 325.0000 mg | ORAL_TABLET | Freq: Three times a day (TID) | ORAL | 3 refills | Status: DC

## 2018-05-10 MED ORDER — IBUPROFEN 600 MG PO TABS: 600.0000 mg | ORAL_TABLET | Freq: Four times a day (QID) | ORAL | 0 refills | Status: DC

## 2018-05-10 NOTE — Discharge Summary (Signed)
  Obstetric Discharge Summary  Patient ID: Bonnie Graham MRN: [id number] DOB/AGE: 20/04/1996 20 y.o.   Date of Admission: 05/07/2018 Serafina RoyalsMichelle Ishika Chesterfield, CNM [facility](D. Evans, MD)  Date of Discharge:  05/10/18 Serafina RoyalsMichelle Donyelle Enyeart, CNM [facility](D. Evans, MD)  Admitting Diagnosis: Preterm labor at 2533w6d  Secondary Diagnosis: Postpartum anemia  Mode of Delivery: normal spontaneous vaginal delivery     Discharge Diagnosis: No other diagnosis   Intrapartum Procedures: epidural and GBS prophylaxis   Post partum procedures: none  Complications: First degree perineal laceration and right labial laceration   Brief Hospital Course   Bonnie Graham is a G1P0000 who had a SVD on 05/08/2018;  for further details of this birth, please refer to the delivey note.  Patient had an uncomplicated postpartum course.  By time of discharge on PPD#2, her pain was controlled on oral pain medications; she had appropriate lochia and was ambulating, voiding without difficulty and tolerating regular diet.  She was deemed stable for discharge to home.    Labs: CBC Latest Ref Rng & Units 05/09/2018 05/08/2018 05/07/2018  WBC 3.6 - 11.0 K/uL 24.7(H) 39.5(H) 19.1(H)  Hemoglobin 12.0 - 16.0 g/dL 10.1(L) 10.7(L) 12.1  Hematocrit 35.0 - 47.0 % 29.8(L) 32.2(L) 35.9  Platelets 150 - 440 K/uL 270 294 283   A POS  Physical exam:   Temp:  [97.8 F (36.6 C)-98.7 F (37.1 C)] 98.1 F (36.7 C) (07/01 0809) Pulse Rate:  [65-79] 77 (07/01 0809) Resp:  [18] 18 (07/01 0809) BP: (102-124)/(63-80) 124/80 (07/01 0809) SpO2:  [98 %-99 %] 99 % (07/01 0809)  General: alert and no distress  Lochia: appropriate  Abdomen: soft, NT  Uterine Fundus: firm  Perineum: healing well, no significant drainage, no dehiscence, no significant erythema  Extremities: No evidence of DVT seen on physical exam. No lower extremity edema.  Discharge Instructions: Per After Visit Summary.  Activity: Advance as tolerated. Pelvic rest for 6 weeks.   Also refer to  After Visit Summary  Diet: Regular  Medications: Allergies as of 05/10/2018   No Known Allergies     Medication List    TAKE these medications   ferrous sulfate 325 (65 FE) MG tablet Take 1 tablet (325 mg total) by mouth 3 (three) times daily with meals.   ibuprofen 600 MG tablet Commonly known as:  ADVIL,MOTRIN Take 1 tablet (600 mg total) by mouth every 6 (six) hours.   magnesium oxide 400 MG tablet Commonly known as:  MAG-OX Take 1 tablet (400 mg total) by mouth 2 (two) times daily.   prenatal multivitamin Tabs tablet Take 1 tablet by mouth daily at 12 noon.      Outpatient follow up:  Follow-up Information    Gunnar BullaLawhorn, Ladavion Savitz Michelle, CNM. Call.   Specialties:  Certified Nurse Midwife, Obstetrics and Gynecology, Radiology Why:  Please call to schedule six (6) week PPV with JML Contact information: [address] [place] [address] [phone]          Postpartum contraception: abstinence  Discharged Condition: stable  Discharged to: home   Newborn Data:  Disposition:NICU  Apgars: APGAR (1 MIN): 4   APGAR (5 MINS): 8    Baby Feeding: Breast   Gunnar BullaJenkins Michelle Monseratt Ledin, CNM [facility], [facility] 05/10/18 9:20 AM

## 2018-05-10 NOTE — Discharge Instructions (Signed)
Please call your doctor or return to the ER if you experience any chest pains, shortness of breath, dizziness, visual changes, fever greater than 101, any heavy bleeding (saturating more than 1 pad per hour), large clots, or foul smelling discharge, any worsening abdominal pain and cramping that is not controlled by pain medication, or any signs of postpartum depression. No tampons, enemas, douches, or sexual intercourse for 6 weeks. Also avoid tub baths, hot tubs, or swimming for 6 weeks.  °

## 2018-05-10 NOTE — Lactation Note (Signed)
This note was copied from a baby's chart. Lactation Consultation Note  Patient Name: Boy Mordecai RasmussenHannah Coble WGNFA'OToday's Date: 05/10/2018  Mom being discharged this afternoon but [name] will remain in SCN.  Mom having difficulty getting pump through insurance.  They have been giving her names of distributers to get pump, but all of them say they no longer sell breast pumps.  [employer] information given to contact to expedite process to get DEBP at no added cost to mom.  [facility] pump for a week until she can get DEBP through insurance.  Continued to praise mom for her commitment to supply breast milk for [name].  Discharge community contacts given and encouraged to call lactation with any questions, concerns or assistance.     Maternal Data    Feeding Feeding Type: Breast Milk  LATCH Score                   Interventions    Lactation Tools Discussed/Used     Consult Status      [name], Idrissa Beville Kay 05/10/2018, 1:03 PM

## 2018-05-10 NOTE — Progress Notes (Signed)
Discharge order received from doctor. Varicella vaccine offered prior to discharge. Pt declined varicella vaccine stating she would get it in the office. Reviewed discharge instructions and prescriptions with patient and answered all questions. Follow up appointment instructions given. Patient verbalized understanding. Patient discharged home (without infant; infant in SCN) via wheelchair by nursing/auxillary.    Oswald HillockAbigail Garner, RN

## 2018-05-11 LAB — SURGICAL PATHOLOGY

## 2018-05-17 ENCOUNTER — Ambulatory Visit: Payer: Self-pay

## 2018-05-17 NOTE — Lactation Note (Signed)
This note was copied from a baby's chart. Lactation Consultation Note  Patient Name: Bonnie Mordecai RasmussenHannah Coble ONGEX'BToday's Date: 05/17/2018     Maternal Data  pumps breast every 3-4 hrs, obtains large amts, EBM for 1 wk old baby  Feeding Feeding Type: Breast Milk First attempt at breast for practice feeding, occasional sucking, opened mouth and latched well in football hold, fussy in cradle hold LATCH Score                   Interventions  Encouragement and reassurance  Lactation Tools Discussed/Used Tools: Pump;36F feeding tube / Syringe   Consult Status      Bonnie KiefMarsha D Zackariah Graham 05/17/2018, 9:19 PM

## 2018-05-18 ENCOUNTER — Encounter: Payer: BLUE CROSS/BLUE SHIELD | Admitting: Obstetrics and Gynecology

## 2018-05-21 ENCOUNTER — Ambulatory Visit: Payer: Self-pay

## 2018-05-21 NOTE — Lactation Note (Signed)
This note was copied from a baby's chart. 111Lactation Consultation Note  Patient Name: Bonnie Mordecai RasmussenHannah Coble ZOXWR'UToday's Date: 05/21/2018   Mom pre pumped at 1015 am and attempted lick & learn at 1115am.  [name] was really sleepy.  He made a couple of efforts at the breast, but never really latched or sucked.  Mom reports pumping 6 to 10 oz per session.  [facility] Symphony [facility] and Spectra at home.  Praised mom for her commitment to supply milk for her SCN baby.  Encouraged skin to skin whenever she is here visiting or attempting lick and learn.  Encouraged mom to call with any questions, concerns or if assistance needed.    Maternal Data    Feeding Feeding Type: Breast Milk  LATCH Score                   Interventions    Lactation Tools Discussed/Used Tools: 51F feeding tube / Syringe   Consult Status      [name], Alexyia Guarino Kay 05/21/2018, 3:17 PM

## 2018-05-24 ENCOUNTER — Other Ambulatory Visit: Payer: Self-pay | Admitting: Certified Nurse Midwife

## 2018-05-24 ENCOUNTER — Encounter (INDEPENDENT_AMBULATORY_CARE_PROVIDER_SITE_OTHER): Payer: Self-pay

## 2018-05-24 ENCOUNTER — Encounter: Payer: Self-pay | Admitting: Certified Nurse Midwife

## 2018-05-24 MED ORDER — DICLOXACILLIN SODIUM 250 MG PO CAPS: 250.0000 mg | ORAL_CAPSULE | Freq: Four times a day (QID) | ORAL | 0 refills | Status: AC

## 2018-05-28 ENCOUNTER — Ambulatory Visit: Payer: Self-pay

## 2018-05-28 NOTE — Lactation Note (Signed)
This note was copied from a baby's chart. Lactation Consultation Note  Patient Name: Bonnie Mordecai RasmussenHannah Graham ZOXWR'UToday's Date: 05/28/2018     Maternal Data    Feeding Feeding Type: Breast Milk  LATCH Score                   Interventions    Lactation Tools Discussed/Used     Consult Status  LC called to room to assist with latch using the shield. Infant seem not interested at first but [facility] encouraged mother to burp him and try again. Afterwards infant successfully latched on to the breast using the shield with audible swallows heard. Pre and post weight were completed by nurse.    [name] 05/28/2018, 4:59 PM

## 2018-06-03 ENCOUNTER — Encounter: Payer: Self-pay | Admitting: Certified Nurse Midwife

## 2018-06-17 ENCOUNTER — Encounter: Payer: Self-pay | Admitting: Certified Nurse Midwife

## 2018-06-17 ENCOUNTER — Ambulatory Visit (INDEPENDENT_AMBULATORY_CARE_PROVIDER_SITE_OTHER): Payer: BLUE CROSS/BLUE SHIELD | Admitting: Certified Nurse Midwife

## 2018-06-17 DIAGNOSIS — Z8751 Personal history of pre-term labor: Secondary | ICD-10-CM

## 2018-06-17 MED ORDER — ETONOGESTREL-ETHINYL ESTRADIOL 0.12-0.015 MG/24HR VA RING: VAGINAL_RING | VAGINAL | 4 refills | Status: DC

## 2018-06-17 NOTE — Patient Instructions (Signed)
Preventive Care 18-39 Years, Female Preventive care refers to lifestyle choices and visits with your health care provider that can promote health and wellness. What does preventive care include?  A yearly physical exam. This is also called an annual well check.  Dental exams once or twice a year.  Routine eye exams. Ask your health care provider how often you should have your eyes checked.  Personal lifestyle choices, including: ? Daily care of your teeth and gums. ? Regular physical activity. ? Eating a healthy diet. ? Avoiding tobacco and drug use. ? Limiting alcohol use. ? Practicing safe sex. ? Taking vitamin and mineral supplements as recommended by your health care provider. What happens during an annual well check? The services and screenings done by your health care provider during your annual well check will depend on your age, overall health, lifestyle risk factors, and family history of disease. Counseling Your health care provider may ask you questions about your:  Alcohol use.  Tobacco use.  Drug use.  Emotional well-being.  Home and relationship well-being.  Sexual activity.  Eating habits.  Work and work Statistician.  Method of birth control.  Menstrual cycle.  Pregnancy history.  Screening You may have the following tests or measurements:  Height, weight, and BMI.  Diabetes screening. This is done by checking your blood sugar (glucose) after you have not eaten for a while (fasting).  Blood pressure.  Lipid and cholesterol levels. These may be checked every 5 years starting at age 66.  Skin check.  Hepatitis C blood test.  Hepatitis B blood test.  Sexually transmitted disease (STD) testing.  BRCA-related cancer screening. This may be done if you have a family history of breast, ovarian, tubal, or peritoneal cancers.  Pelvic exam and Pap test. This may be done every 3 years starting at age 40. Starting at age 59, this may be done every 5  years if you have a Pap test in combination with an HPV test.  Discuss your test results, treatment options, and if necessary, the need for more tests with your health care provider. Vaccines Your health care provider may recommend certain vaccines, such as:  Influenza vaccine. This is recommended every year.  Tetanus, diphtheria, and acellular pertussis (Tdap, Td) vaccine. You may need a Td booster every 10 years.  Varicella vaccine. You may need this if you have not been vaccinated.  HPV vaccine. If you are 69 or younger, you may need three doses over 6 months.  Measles, mumps, and rubella (MMR) vaccine. You may need at least one dose of MMR. You may also need a second dose.  Pneumococcal 13-valent conjugate (PCV13) vaccine. You may need this if you have certain conditions and were not previously vaccinated.  Pneumococcal polysaccharide (PPSV23) vaccine. You may need one or two doses if you smoke cigarettes or if you have certain conditions.  Meningococcal vaccine. One dose is recommended if you are age 27-21 years and a first-year college student living in a residence hall, or if you have one of several medical conditions. You may also need additional booster doses.  Hepatitis A vaccine. You may need this if you have certain conditions or if you travel or work in places where you may be exposed to hepatitis A.  Hepatitis B vaccine. You may need this if you have certain conditions or if you travel or work in places where you may be exposed to hepatitis B.  Haemophilus influenzae type b (Hib) vaccine. You may need this if  you have certain risk factors.  Talk to your health care provider about which screenings and vaccines you need and how often you need them. This information is not intended to replace advice given to you by your health care provider. Make sure you discuss any questions you have with your health care provider. Document Released: 12/23/2001 Document Revised: 07/16/2016  Document Reviewed: 08/28/2015 Elsevier Interactive Patient Education  [employer].

## 2018-06-17 NOTE — Progress Notes (Signed)
Subjective:    Bonnie Graham is a 22 y.o. 621P0101 Caucasian female who presents for a postpartum visit. She is 6 weeks postpartum following a spontaneous vaginal delivery at 33 gestational weeks. Anesthesia: epidural. I have fully reviewed the prenatal and intrapartum course.   Postpartum course has been uncomplicated. Baby's course has been complicated by NICU stay. Baby is feeding by breast. Bleeding no bleeding. Bowel function is normal. Bladder function is normal.   Patient is not sexually active. Contraception method is abstinence. Postpartum depression screening: negative. Score 1.  Last pap: N/A.  Denies difficulty breathing or respiratory distress, chest pain, abdominal pain, vaginal bleeding, dysuria, and leg pain or swelling.   The following portions of the patient's history were reviewed and updated as appropriate: allergies, current medications, past medical history, past surgical history and problem list.  Review of Systems  Pertinent items are noted in HPI.   Objective:   BP 111/68   Pulse 97   Ht 5\' 3"  (1.6 m)   Wt 154 lb (69.9 kg)   Breastfeeding? Yes   BMI 27.28 kg/m   General:  alert, cooperative and no distress   Breasts:  deferred, no complaints  Lungs: clear to auscultation bilaterally  Heart:  regular rate and rhythm  Abdomen: soft, nontender   Vulva: normal  Vagina: normal vagina  Cervix:  closed  Corpus: Well-involuted  Adnexa:  Non-palpable     Office Visit from 06/17/2018 in [facility]  PHQ-9 Total Score  1          Assessment:   Postpartum exam Six (6) wks s/p spontaneous vaginal birth Breastfeeding Depression screening Contraception counseling   Plan:   Reviewed forms of birth control options available including hormonal contraceptive medication including pill, patch, ring, injection,contraceptive implant; hormonal and nonhormonal IUDs. Risks and benefits reviewed.  Questions were answered.  Information was given to patient  to review  Rx: Nuvaring, see orders  May return to school or work without restriction  Encouraged routine health maintenance; see AVS  Follow up in: 7 months for Annual Exam with Pap or earlier if needed   Gunnar BullaJenkins Michelle Tomiko Graham, CNM [facility], [facility]

## 2018-08-16 ENCOUNTER — Encounter: Payer: Self-pay | Admitting: Certified Nurse Midwife

## 2018-08-17 ENCOUNTER — Other Ambulatory Visit: Payer: Self-pay

## 2018-08-17 MED ORDER — ETONOGESTREL-ETHINYL ESTRADIOL 0.12-0.015 MG/24HR VA RING: VAGINAL_RING | VAGINAL | 4 refills | Status: DC

## 2018-09-05 ENCOUNTER — Encounter: Payer: Self-pay | Admitting: Certified Nurse Midwife

## 2018-09-06 ENCOUNTER — Ambulatory Visit (INDEPENDENT_AMBULATORY_CARE_PROVIDER_SITE_OTHER): Payer: BLUE CROSS/BLUE SHIELD | Admitting: Certified Nurse Midwife

## 2018-09-06 ENCOUNTER — Encounter: Payer: Self-pay | Admitting: Certified Nurse Midwife

## 2018-09-06 VITALS — BP 121/90 | HR 92 | Ht 63.0 in | Wt 148.7 lb

## 2018-09-06 DIAGNOSIS — F419 Anxiety disorder, unspecified: Secondary | ICD-10-CM | POA: Diagnosis not present

## 2018-09-06 MED ORDER — PAROXETINE HCL 20 MG PO TABS
20.0000 mg | ORAL_TABLET | Freq: Every day | ORAL | 1 refills | Status: DC
Start: 2018-09-06 — End: 2018-10-11

## 2018-09-06 NOTE — Progress Notes (Signed)
GYN ENCOUNTER NOTE  Subjective:       Bonnie Graham is a 22 y.o. G64P0101 female here for evaluation of the following issues:  1. Anxiety  Reports feeling restless and unsettled for the last few weeks to months. States life no longer feels peaceful. Noticed this weekend that when things "didn't go as planned, she was very upset". Symptoms are worse with stress. Things are better when she "isolates" herself.   Denies difficulty breathing or respiratory distress, chest pain, abdominal pain, excessive vaginal bleeding, dysuria, and leg pain or swelling.   No SI or HI.   Patient is lactating. Stopped NuvaRing due to decreased milk supply.    Gynecologic History  Patient's last menstrual period was 09/03/2018 (exact date).  Contraception: condoms  Last Pap: N/A.   Obstetric History  OB History  Gravida Para Term Preterm AB Living  1 1 0 1 0 1  SAB TAB Ectopic Multiple Live Births  0 0 0 0 1    # Outcome Date GA Lbr Len/2nd Weight Sex Delivery Anes PTL Lv  1 Preterm 05/08/18 [redacted]w[redacted]d  3 lb 1.9 oz (1.415 kg) M Vag-Spont   LIV    Past Medical History:  Diagnosis Date  . Heavy periods   . Painful menstrual periods     Past Surgical History:  Procedure Laterality Date  . NO PAST SURGERIES      Current Outpatient Medications on File Prior to Visit  Medication Sig Dispense Refill  . Prenatal Vit-Fe Fumarate-FA (PRENATAL MULTIVITAMIN) TABS tablet Take 1 tablet by mouth daily at 12 noon.     No current facility-administered medications on file prior to visit.     No Known Allergies  Social History   Socioeconomic History  . Marital status: Single    Spouse name: Not on file  . Number of children: Not on file  . Years of education: Not on file  . Highest education level: Not on file  Occupational History  . Not on file  Social Needs  . Financial resource strain: Not on file  . Food insecurity:    Worry: Not on file    Inability: Not on file  . Transportation  needs:    Medical: Not on file    Non-medical: Not on file  Tobacco Use  . Smoking status: Never Smoker  . Smokeless tobacco: Never Used  Substance and Sexual Activity  . Alcohol use: No  . Drug use: No  . Sexual activity: Yes    Birth control/protection: Condom  Lifestyle  . Physical activity:    Days per week: Not on file    Minutes per session: Not on file  . Stress: Not on file  Relationships  . Social connections:    Talks on phone: Not on file    Gets together: Not on file    Attends religious service: Not on file    Active member of club or organization: Not on file    Attends meetings of clubs or organizations: Not on file    Relationship status: Not on file  . Intimate partner violence:    Fear of current or ex partner: Not on file    Emotionally abused: Not on file    Physically abused: Not on file    Forced sexual activity: Not on file  Other Topics Concern  . Not on file  Social History Narrative  . Not on file    Family History  Problem Relation Age of Onset  .  Heart disease Father   . Endometriosis Sister   . Endometriosis Maternal Grandmother        grt  . Breast cancer Neg Hx   . Ovarian cancer Neg Hx   . Colon cancer Neg Hx   . Diabetes Neg Hx     The following portions of the patient's history were reviewed and updated as appropriate: allergies, current medications, past family history, past medical history, past social history, past surgical history and problem list.  Review of Systems  ROS negative except as noted above. Information obtained from patient.   Objective:   BP 121/90   Pulse 92   Ht 5\' 3"  (1.6 m)   Wt 148 lb 11.2 oz (67.4 kg)   LMP 09/03/2018 (Exact Date)   Breastfeeding? Yes   BMI 26.34 kg/m   CONSTITUTIONAL: Well-developed, well-nourished female in no acute distress.   GAD 7 : Generalized Anxiety Score 09/06/2018  Nervous, Anxious, on Edge 2  Control/stop worrying 2  Worry too much - different things 3  Trouble  relaxing 2  Restless 1  Easily annoyed or irritable 3  Afraid - awful might happen 3  Total GAD 7 Score 16  Anxiety Difficulty Somewhat difficult   Assessment:   1. Anxiety  Plan:   Rx: Paxil, see orders  Educations regarding home coping techniques and mindfulness strategies, see AVS  Reviewed red flag symptoms and when to call  RTC x 4-6 weeks for medication check or sooner if needed.    Gunnar Bulla, CNM [facility], [facility] 09/06/18 3:30 PM

## 2018-09-06 NOTE — Patient Instructions (Addendum)
Living With Anxiety After being diagnosed with an anxiety disorder, you may be relieved to know why you have felt or behaved a certain way. It is natural to also feel overwhelmed about the treatment ahead and what it will mean for your life. With care and support, you can manage this condition and recover from it. How to cope with anxiety Dealing with stress Stress is your body's reaction to life changes and events, both good and bad. Stress can last just a few hours or it can be ongoing. Stress can play a major role in anxiety, so it is important to learn both how to cope with stress and how to think about it differently. Talk with your health care provider or a counselor to learn more about stress reduction. He or she may suggest some stress reduction techniques, such as:  Music therapy. This can include creating or listening to music that you enjoy and that inspires you.  Mindfulness-based meditation. This involves being aware of your normal breaths, rather than trying to control your breathing. It can be done while sitting or walking.  Centering prayer. This is a kind of meditation that involves focusing on a word, phrase, or sacred image that is meaningful to you and that brings you peace.  Deep breathing. To do this, expand your stomach and inhale slowly through your nose. Hold your breath for 3-5 seconds. Then exhale slowly, allowing your stomach muscles to relax.  Self-talk. This is a skill where you identify thought patterns that lead to anxiety reactions and correct those thoughts.  Muscle relaxation. This involves tensing muscles then relaxing them.  Choose a stress reduction technique that fits your lifestyle and personality. Stress reduction techniques take time and practice. Set aside 5-15 minutes a day to do them. Therapists can offer training in these techniques. The training may be covered by some insurance plans. Other things you can do to manage stress include:  Keeping a  stress diary. This can help you learn what triggers your stress and ways to control your response.  Thinking about how you respond to certain situations. You may not be able to control everything, but you can control your reaction.  Making time for activities that help you relax, and not feeling guilty about spending your time in this way.  Therapy combined with coping and stress-reduction skills provides the best chance for successful treatment. Medicines Medicines can help ease symptoms. Medicines for anxiety include:  Anti-anxiety drugs.  Antidepressants.  Beta-blockers.  Medicines may be used as the main treatment for anxiety disorder, along with therapy, or if other treatments are not working. Medicines should be prescribed by a health care provider. Relationships Relationships can play a big part in helping you recover. Try to spend more time connecting with trusted friends and family members. Consider going to couples counseling, taking family education classes, or going to family therapy. Therapy can help you and others better understand the condition. How to recognize changes in your condition Everyone has a different response to treatment for anxiety. Recovery from anxiety happens when symptoms decrease and stop interfering with your daily activities at home or work. This may mean that you will start to:  Have better concentration and focus.  Sleep better.  Be less irritable.  Have more energy.  Have improved memory.  It is important to recognize when your condition is getting worse. Contact your health care provider if your symptoms interfere with home or work and you do not feel like your condition   is improving. Where to find help and support: You can get help and support from these sources:  Self-help groups.  Online and [employer].  A trusted spiritual leader.  Couples counseling.  Family education classes.  Family therapy.  Follow these  instructions at home:  Eat a healthy diet that includes plenty of vegetables, fruits, whole grains, low-fat dairy products, and lean protein. Do not eat a lot of foods that are high in solid fats, added sugars, or salt.  Exercise. Most adults should do the following: ? Exercise for at least 150 minutes each week. The exercise should increase your heart rate and make you sweat (moderate-intensity exercise). ? Strengthening exercises at least twice a week.  Cut down on caffeine, tobacco, alcohol, and other potentially harmful substances.  Get the right amount and quality of sleep. Most adults need 7-9 hours of sleep each night.  Make choices that simplify your life.  Take over-the-counter and prescription medicines only as told by your health care provider.  Avoid caffeine, alcohol, and certain over-the-counter cold medicines. These may make you feel worse. Ask your pharmacist which medicines to avoid.  Keep all follow-up visits as told by your health care provider. This is important. Questions to ask your health care provider  Would I benefit from therapy?  How often should I follow up with a health care provider?  How long do I need to take medicine?  Are there any long-term side effects of my medicine?  Are there any alternatives to taking medicine? Contact a health care provider if:  You have a hard time staying focused or finishing daily tasks.  You spend many hours a day feeling worried about everyday life.  You become exhausted by worry.  You start to have headaches, feel tense, or have nausea.  You urinate more than normal.  You have diarrhea. Get help right away if:  You have a racing heart and shortness of breath.  You have thoughts of hurting yourself or others. If you ever feel like you may hurt yourself or others, or have thoughts about taking your own life, get help right away. You can go to your nearest emergency department or call:  Your local emergency  services (911 in the [place]  A suicide crisis helpline, such as the National Suicide Prevention Lifeline at 703-201-9630. This is open 24-hours a day.  Summary  Taking steps to deal with stress can help calm you.  Medicines cannot cure anxiety disorders, but they can help ease symptoms.  Family, friends, and partners can play a big part in helping you recover from an anxiety disorder. This information is not intended to replace advice given to you by your health care provider. Make sure you discuss any questions you have with your health care provider. Document Released: 10/21/2016 Document Revised: 10/21/2016 Document Reviewed: 10/21/2016 Elsevier Interactive Patient Education  2018 [employer]. Paroxetine tablets What is this medicine? PAROXETINE (pa ROX e teen) is used to treat depression. It may also be used to treat anxiety disorders, obsessive compulsive disorder, panic attacks, post traumatic stress, and premenstrual dysphoric disorder (PMDD). This medicine may be used for other purposes; ask your health care provider or pharmacist if you have questions. COMMON BRAND NAME(S): Paxil, Pexeva What should I tell my health care provider before I take this medicine? They need to know if you have any of these conditions: -bipolar disorder or a family history of bipolar disorder -bleeding disorders -glaucoma -heart disease -kidney disease -liver disease -low levels  of sodium in the blood -seizures -suicidal thoughts, plans, or attempt; a previous suicide attempt by you or a family member -take MAOIs like Carbex, Eldepryl, Marplan, Nardil, and Parnate -take medicines that treat or prevent blood clots -thyroid disease -an unusual or allergic reaction to paroxetine, other medicines, foods, dyes, or preservatives -pregnant or trying to get pregnant -breast-feeding How should I use this medicine? Take this medicine by mouth with a glass of water. Follow the directions on the  prescription label. You can take it with or without food. Take your medicine at regular intervals. Do not take your medicine more often than directed. Do not stop taking this medicine suddenly except upon the advice of your doctor. Stopping this medicine too quickly may cause serious side effects or your condition may worsen. A special MedGuide will be given to you by the pharmacist with each prescription and refill. Be sure to read this information carefully each time. Talk to your pediatrician regarding the use of this medicine in children. Special care may be needed. Overdosage: If you think you have taken too much of this medicine contact a poison control center or emergency room at once. NOTE: This medicine is only for you. Do not share this medicine with others. What if I miss a dose? If you miss a dose, take it as soon as you can. If it is almost time for your next dose, take only that dose. Do not take double or extra doses. What may interact with this medicine? Do not take this medicine with any of the following medications: -linezolid -MAOIs like Carbex, Eldepryl, Marplan, Nardil, and Parnate -methylene blue (injected into a vein) -pimozide -thioridazine This medicine may also interact with the following medications: -alcohol -amphetamines -aspirin and aspirin-like medicines -atomoxetine -certain medicines for depression, anxiety, or psychotic disturbances -certain medicines for irregular heart beat like propafenone, flecainide, encainide, and quinidine -certain medicines for migraine headache like almotriptan, eletriptan, frovatriptan, naratriptan, rizatriptan, sumatriptan, zolmitriptan -cimetidine -digoxin -diuretics -fentanyl -fosamprenavir -furazolidone -isoniazid -lithium -medicines that treat or prevent blood clots like warfarin, enoxaparin, and dalteparin -medicines for sleep -NSAIDs, medicines for pain and inflammation, like ibuprofen or  naproxen -phenobarbital -phenytoin -procarbazine -rasagiline -ritonavir -supplements like St. John's wort, kava kava, valerian -tamoxifen -tramadol -tryptophan This list may not describe all possible interactions. Give your health care provider a list of all the medicines, herbs, non-prescription drugs, or dietary supplements you use. Also tell them if you smoke, drink alcohol, or use illegal drugs. Some items may interact with your medicine. What should I watch for while using this medicine? Tell your doctor if your symptoms do not get better or if they get worse. Visit your doctor or health care professional for regular checks on your progress. Because it may take several weeks to see the full effects of this medicine, it is important to continue your treatment as prescribed by your doctor. Patients and their families should watch out for new or worsening thoughts of suicide or depression. Also watch out for sudden changes in feelings such as feeling anxious, agitated, panicky, irritable, hostile, aggressive, impulsive, severely restless, overly excited and hyperactive, or not being able to sleep. If this happens, especially at the beginning of treatment or after a change in dose, call your health care professional. Bonita Quin may get drowsy or dizzy. Do not drive, use machinery, or do anything that needs mental alertness until you know how this medicine affects you. Do not stand or sit up quickly, especially if you are an older patient.  This reduces the risk of dizzy or fainting spells. Alcohol may interfere with the effect of this medicine. Avoid alcoholic drinks. Your mouth may get dry. Chewing sugarless gum or sucking hard candy, and drinking plenty of water will help. Contact your doctor if the problem does not go away or is severe. What side effects may I notice from receiving this medicine? Side effects that you should report to your doctor or health care professional as soon as  possible: -allergic reactions like skin rash, itching or hives, swelling of the face, lips, or tongue -anxious -black, tarry stools -changes in vision -confusion -elevated mood, decreased need for sleep, racing thoughts, impulsive behavior -eye pain -fast, irregular heartbeat -feeling faint or lightheaded, falls -feeling agitated, angry, or irritable -hallucination, loss of contact with reality -loss of balance or coordination -loss of memory -painful or prolonged erections -restlessness, pacing, inability to keep still -seizures -stiff muscles -suicidal thoughts or other mood changes -trouble sleeping -unusual bleeding or bruising -unusually weak or tired -vomiting Side effects that usually do not require medical attention (report to your doctor or health care professional if they continue or are bothersome): -change in appetite or weight -change in sex drive or performance -diarrhea -dizziness -dry mouth -increased sweating -indigestion, nausea -tired -tremors This list may not describe all possible side effects. Call your doctor for medical advice about side effects. You may report side effects to FDA at 1-800-FDA-1088. Where should I keep my medicine? Keep out of the reach of children. Store at room temperature between 15 and 30 degrees C (59 and 86 degrees F). Keep container tightly closed. Throw away any unused medicine after the expiration date. NOTE: This sheet is a summary. It may not cover all possible information. If you have questions about this medicine, talk to your doctor, pharmacist, or health care provider.  2018 Elsevier/Gold Standard (2016-03-29 15:50:32)

## 2018-09-24 ENCOUNTER — Other Ambulatory Visit: Payer: Self-pay

## 2018-09-24 ENCOUNTER — Encounter: Payer: Self-pay | Admitting: Certified Nurse Midwife

## 2018-09-24 MED ORDER — PAROXETINE HCL 40 MG PO TABS: 40.0000 mg | ORAL_TABLET | ORAL | 0 refills | Status: DC

## 2018-10-11 ENCOUNTER — Ambulatory Visit (INDEPENDENT_AMBULATORY_CARE_PROVIDER_SITE_OTHER): Payer: BLUE CROSS/BLUE SHIELD | Admitting: Certified Nurse Midwife

## 2018-10-11 ENCOUNTER — Encounter: Payer: Self-pay | Admitting: Certified Nurse Midwife

## 2018-10-11 VITALS — BP 101/66 | HR 81 | Ht 63.0 in | Wt 147.3 lb

## 2018-10-11 DIAGNOSIS — F418 Other specified anxiety disorders: Secondary | ICD-10-CM | POA: Diagnosis not present

## 2018-10-11 DIAGNOSIS — O99345 Other mental disorders complicating the puerperium: Secondary | ICD-10-CM | POA: Diagnosis not present

## 2018-10-11 HISTORY — DX: Other mental disorders complicating the puerperium: O99.345

## 2018-10-11 HISTORY — DX: Other specified anxiety disorders: F41.8

## 2018-10-11 MED ORDER — PAROXETINE HCL 10 MG PO TABS: 10.0000 mg | ORAL_TABLET | Freq: Every day | ORAL | 1 refills | Status: DC

## 2018-10-11 NOTE — Progress Notes (Signed)
Patient here to follow up on starting Paxil, feeling better but some days feels down.

## 2018-10-11 NOTE — Patient Instructions (Addendum)
Paroxetine tablets What is this medicine? PAROXETINE (pa ROX e teen) is used to treat depression. It may also be used to treat anxiety disorders, obsessive compulsive disorder, panic attacks, post traumatic stress, and premenstrual dysphoric disorder (PMDD). This medicine may be used for other purposes; ask your health care provider or pharmacist if you have questions. COMMON BRAND NAME(S): Paxil, Pexeva What should I tell my health care provider before I take this medicine? They need to know if you have any of these conditions: -bipolar disorder or a family history of bipolar disorder -bleeding disorders -glaucoma -heart disease -kidney disease -liver disease -low levels of sodium in the blood -seizures -suicidal thoughts, plans, or attempt; a previous suicide attempt by you or a family member -take MAOIs like Carbex, Eldepryl, Marplan, Nardil, and Parnate -take medicines that treat or prevent blood clots -thyroid disease -an unusual or allergic reaction to paroxetine, other medicines, foods, dyes, or preservatives -pregnant or trying to get pregnant -breast-feeding How should I use this medicine? Take this medicine by mouth with a glass of water. Follow the directions on the prescription label. You can take it with or without food. Take your medicine at regular intervals. Do not take your medicine more often than directed. Do not stop taking this medicine suddenly except upon the advice of your doctor. Stopping this medicine too quickly may cause serious side effects or your condition may worsen. A special MedGuide will be given to you by the pharmacist with each prescription and refill. Be sure to read this information carefully each time. Talk to your pediatrician regarding the use of this medicine in children. Special care may be needed. Overdosage: If you think you have taken too much of this medicine contact a poison control center or emergency room at once. NOTE: This medicine is  only for you. Do not share this medicine with others. What if I miss a dose? If you miss a dose, take it as soon as you can. If it is almost time for your next dose, take only that dose. Do not take double or extra doses. What may interact with this medicine? Do not take this medicine with any of the following medications: -linezolid -MAOIs like Carbex, Eldepryl, Marplan, Nardil, and Parnate -methylene blue (injected into a vein) -pimozide -thioridazine This medicine may also interact with the following medications: -alcohol -amphetamines -aspirin and aspirin-like medicines -atomoxetine -certain medicines for depression, anxiety, or psychotic disturbances -certain medicines for irregular heart beat like propafenone, flecainide, encainide, and quinidine -certain medicines for migraine headache like almotriptan, eletriptan, frovatriptan, naratriptan, rizatriptan, sumatriptan, zolmitriptan -cimetidine -digoxin -diuretics -fentanyl -fosamprenavir -furazolidone -isoniazid -lithium -medicines that treat or prevent blood clots like warfarin, enoxaparin, and dalteparin -medicines for sleep -NSAIDs, medicines for pain and inflammation, like ibuprofen or naproxen -phenobarbital -phenytoin -procarbazine -rasagiline -ritonavir -supplements like St. John's wort, kava kava, valerian -tamoxifen -tramadol -tryptophan This list may not describe all possible interactions. Give your health care provider a list of all the medicines, herbs, non-prescription drugs, or dietary supplements you use. Also tell them if you smoke, drink alcohol, or use illegal drugs. Some items may interact with your medicine. What should I watch for while using this medicine? Tell your doctor if your symptoms do not get better or if they get worse. Visit your doctor or health care professional for regular checks on your progress. Because it may take several weeks to see the full effects of this medicine, it is important  to continue your treatment as prescribed by your doctor.  Patients and their families should watch out for new or worsening thoughts of suicide or depression. Also watch out for sudden changes in feelings such as feeling anxious, agitated, panicky, irritable, hostile, aggressive, impulsive, severely restless, overly excited and hyperactive, or not being able to sleep. If this happens, especially at the beginning of treatment or after a change in dose, call your health care professional. Bonita QuinYou may get drowsy or dizzy. Do not drive, use machinery, or do anything that needs mental alertness until you know how this medicine affects you. Do not stand or sit up quickly, especially if you are an older patient. This reduces the risk of dizzy or fainting spells. Alcohol may interfere with the effect of this medicine. Avoid alcoholic drinks. Your mouth may get dry. Chewing sugarless gum or sucking hard candy, and drinking plenty of water will help. Contact your doctor if the problem does not go away or is severe. What side effects may I notice from receiving this medicine? Side effects that you should report to your doctor or health care professional as soon as possible: -allergic reactions like skin rash, itching or hives, swelling of the face, lips, or tongue -anxious -black, tarry stools -changes in vision -confusion -elevated mood, decreased need for sleep, racing thoughts, impulsive behavior -eye pain -fast, irregular heartbeat -feeling faint or lightheaded, falls -feeling agitated, angry, or irritable -hallucination, loss of contact with reality -loss of balance or coordination -loss of memory -painful or prolonged erections -restlessness, pacing, inability to keep still -seizures -stiff muscles -suicidal thoughts or other mood changes -trouble sleeping -unusual bleeding or bruising -unusually weak or tired -vomiting Side effects that usually do not require medical attention (report to your  doctor or health care professional if they continue or are bothersome): -change in appetite or weight -change in sex drive or performance -diarrhea -dizziness -dry mouth -increased sweating -indigestion, nausea -tired -tremors This list may not describe all possible side effects. Call your doctor for medical advice about side effects. You may report side effects to FDA at 1-800-FDA-1088. Where should I keep my medicine? Keep out of the reach of children. Store at room temperature between 15 and 30 degrees C (59 and 86 degrees F). Keep container tightly closed. Throw away any unused medicine after the expiration date. NOTE: This sheet is a summary. It may not cover all possible information. If you have questions about this medicine, talk to your doctor, pharmacist, or health care provider.  2018 Elsevier/Gold Standard (2016-03-29 15:50:32) Perinatal Anxiety When a woman feels excessive tension or worry (anxiety) during pregnancy or during the first 12 months after she gives birth, she has a condition called perinatal anxiety. Anxiety can interfere with work, school, relationships, and other everyday activities. If it is not managed properly, it can also cause problems in the mother and her baby.  If you are pregnant and you have symptoms of an anxiety disorder, it is important to talk with your health care provider. What are the causes? The exact cause of this condition is not known. Hormonal changes during and after pregnancy may play a role in causing perinatal anxiety. What increases the risk? You are more likely to develop this condition if:  You have a personal or family history of depression, anxiety, or mood disorders.  You experience a stressful life event during pregnancy, such as the death of a loved one.  You have a lot of regular life stress, such as being a single parent.  You have thyroid problems.  What  are the signs or symptoms? Perinatal anxiety can be different for  everyone. It may include:  Panic attacks (panic disorder). These are intense episodes of fear or discomfort that may also cause sweating, nausea, shortness of breath, or fear of dying. They usually last 5-15 minutes.  Reliving an upsetting (traumatic) event through distressing thoughts, dreams, or flashbacks (post-traumatic stress disorder, or PTSD).  Excessive worry about multiple problems (generalized anxiety disorder).  Fear and stress about leaving certain people or loved ones (separation anxiety).  Performing repetitive tasks (compulsions) to relieve stress or worry (obsessive compulsive disorder, or OCD).  Fear of certain objects or situations (phobias).  Excessive worrying, such as a constant feeling that something bad is going to happen.  Inability to relax.  Difficulty concentrating.  Sleep problems.  Frequent nightmares or disturbing thoughts.  How is this diagnosed? This condition is diagnosed based on a physical exam and mental evaluation. In some cases, your health care provider may use an anxiety screening tool. These tools include a list of questions that can help a health care provider diagnose anxiety. Your health care provider may refer you to a mental health expert who specializes in anxiety. How is this treated? This condition may be treated with:  Medicines. Your health care provider will only give you medicines that have been proven safe for pregnancy and breastfeeding.  Talk therapy with a mental health professional to help change your patterns of thinking (cognitive behavioral therapy).  Mindfulness-based stress reduction.  Other relaxation therapies, such as deep breathing or guided muscle relaxation.  Support groups.  Follow these instructions at home: Lifestyle  Do not use any products that contain nicotine or tobacco, such as cigarettes and e-cigarettes. If you need help quitting, ask your health care provider.  Do not use alcohol when you are  pregnant. After your baby is born, limit alcohol intake to no more than 1 drink a day. One drink equals 12 oz of beer, 5 oz of wine, or 1 oz of hard liquor.  Consider joining a support group for new mothers. Ask your health care provider for recommendations.  Take good care of yourself. Make sure you: ? Get plenty of sleep. If you are having trouble sleeping, talk with your health care provider. ? Eat a healthy diet. This includes plenty of fruits and vegetables, whole grains, and lean proteins. ? Exercise regularly, as told by your health care provider. Ask your health care provider what exercises are safe for you. General instructions  Take over-the-counter and prescription medicines only as told by your health care provider.  Talk with your partner or family members about your feelings during pregnancy. Share any concerns or fears that you may have.  Ask for help with tasks or chores when you need it. Ask friends and family members to provide meals, watch your children, or help with cleaning.  Keep all follow-up visits as told by your health care provider. This is important. Contact a health care provider if:  You (or people close to you) notice that you have any symptoms of anxiety or depression.  You have anxiety and your symptoms get worse.  You experience side effects from medicines, such as nausea or sleep problems. Get help right away if:  You feel like hurting yourself, your baby, or someone else. If you ever feel like you may hurt yourself or others, or have thoughts about taking your own life, get help right away. You can go to your nearest emergency department or call:  Your local emergency services (911 in the [place]  A suicide crisis helpline, such as the National Suicide Prevention Lifeline at 6805528910. This is open 24 hours a day.  Summary  Perinatal anxiety is when a woman feels excessive tension or worry during pregnancy or during the first 12 months after  she gives birth.  Perinatal anxiety may include panic attacks, post-traumatic stress disorder, separation anxiety, phobias, or generalized anxiety.  Perinatal anxiety can cause physical health problems in the mother and baby if not properly managed.  This condition is treated with medicines, talk therapy, stress reduction therapies, or a combination of two or more treatments.  Talk with your partner or family members about your concerns or fears. Do not be afraid to ask for help. This information is not intended to replace advice given to you by your health care provider. Make sure you discuss any questions you have with your health care provider. Document Released: 12/24/2016 Document Revised: 12/24/2016 Document Reviewed: 12/24/2016 Elsevier Interactive Patient Education  [employer].

## 2018-10-11 NOTE — Progress Notes (Signed)
GYN ENCOUNTER NOTE  Subjective:       Bonnie Graham is a 22 y.o. G49P0101 female is here for postpartum mood check.   Notes feeling better, but still feeling down on some days. Questions increasing dose at this time. No SI/HI.   Denies difficulty breathing or respiratory distress, chest pain, abdominal pain, excessive vaginal bleeding, dysuria, and leg pain or swelling.    Gynecologic History  Patient's last menstrual period was 09/25/2018 (exact date).  Period Cycle (Days): 28 Period Duration (Days): 7 Period Pattern: Regular Menstrual Flow: Moderate, Heavy Menstrual Control: Tampon Dysmenorrhea: (!) Mild Dysmenorrhea Symptoms: Cramping   Contraception: condoms  Last Pap: N/A.   Obstetric History  OB History  Gravida Para Term Preterm AB Living  1 1 0 1 0 1  SAB TAB Ectopic Multiple Live Births  0 0 0 0 1    # Outcome Date GA Lbr Len/2nd Weight Sex Delivery Anes PTL Lv  1 Preterm 05/08/18 [redacted]w[redacted]d  3 lb 1.9 oz (1.415 kg) M Vag-Spont   LIV    Past Medical History:  Diagnosis Date  . Heavy periods   . Painful menstrual periods     Past Surgical History:  Procedure Laterality Date  . NO PAST SURGERIES      Current Outpatient Medications on File Prior to Visit  Medication Sig Dispense Refill  . PARoxetine (PAXIL) 40 MG tablet Take 1 tablet (40 mg total) by mouth every morning. 30 tablet 0  . Prenatal Vit-Fe Fumarate-FA (PRENATAL MULTIVITAMIN) TABS tablet Take 1 tablet by mouth daily at 12 noon.     No current facility-administered medications on file prior to visit.     No Known Allergies  Social History   Socioeconomic History  . Marital status: Single    Spouse name: Not on file  . Number of children: Not on file  . Years of education: Not on file  . Highest education level: Not on file  Occupational History  . Not on file  Social Needs  . Financial resource strain: Not on file  . Food insecurity:    Worry: Not on file    Inability: Not on file   . Transportation needs:    Medical: Not on file    Non-medical: Not on file  Tobacco Use  . Smoking status: Never Smoker  . Smokeless tobacco: Never Used  Substance and Sexual Activity  . Alcohol use: No  . Drug use: No  . Sexual activity: Yes    Birth control/protection: Condom  Lifestyle  . Physical activity:    Days per week: Not on file    Minutes per session: Not on file  . Stress: Not on file  Relationships  . Social connections:    Talks on phone: Not on file    Gets together: Not on file    Attends religious service: Not on file    Active member of club or organization: Not on file    Attends meetings of clubs or organizations: Not on file    Relationship status: Not on file  . Intimate partner violence:    Fear of current or ex partner: Not on file    Emotionally abused: Not on file    Physically abused: Not on file    Forced sexual activity: Not on file  Other Topics Concern  . Not on file  Social History Narrative  . Not on file    Family History  Problem Relation Age of Onset  . Heart  disease Father   . Endometriosis Sister   . Endometriosis Maternal Grandmother        grt  . Breast cancer Neg Hx   . Ovarian cancer Neg Hx   . Colon cancer Neg Hx   . Diabetes Neg Hx     The following portions of the patient's history were reviewed and updated as appropriate: allergies, current medications, past family history, past medical history, past social history, past surgical history and problem list.  Review of Systems  ROS negative except as noted above. Information obtained from patient.   Objective:   BP 101/66   Pulse 81   Ht 5\' 3"  (1.6 m)   Wt 147 lb 4.8 oz (66.8 kg)   LMP 09/25/2018 (Exact Date)   Breastfeeding? Yes   BMI 26.09 kg/m   GENERAL: Alert and oriented x 4, no apparent distress.   GAD 7 : Generalized Anxiety Score 10/11/2018 09/06/2018  Nervous, Anxious, on Edge 1 2  Control/stop worrying 0 2  Worry too much - different things 0  3  Trouble relaxing 1 2  Restless 1 1  Easily annoyed or irritable 1 3  Afraid - awful might happen 0 3  Total GAD 7 Score 4 16  Anxiety Difficulty Somewhat difficult Somewhat difficult    Assessment:   1. Postpartum anxiety  Plan:   Rx: Paxil, see orders.   Declines counseling at this time.   Reviewed red flag symptoms and when to call.   RTC x 6 months for follow up or sooner if needed.    Gunnar BullaJenkins Michelle Kittie Krizan, CNM [facility], [facility] 10/11/18 1:58 PM

## 2018-10-14 ENCOUNTER — Encounter: Payer: Self-pay | Admitting: Certified Nurse Midwife

## 2018-10-14 ENCOUNTER — Other Ambulatory Visit: Payer: Self-pay | Admitting: Certified Nurse Midwife

## 2018-10-14 MED ORDER — DICLOXACILLIN SODIUM 500 MG PO CAPS: 500.0000 mg | ORAL_CAPSULE | Freq: Four times a day (QID) | ORAL | 0 refills | Status: AC

## 2018-10-14 MED ORDER — FLUCONAZOLE 150 MG PO TABS: 150.0000 mg | ORAL_TABLET | ORAL | 3 refills | Status: DC

## 2018-10-17 ENCOUNTER — Other Ambulatory Visit: Payer: Self-pay | Admitting: Certified Nurse Midwife

## 2018-10-25 ENCOUNTER — Encounter: Payer: Self-pay | Admitting: Certified Nurse Midwife

## 2018-10-25 ENCOUNTER — Other Ambulatory Visit: Payer: Self-pay | Admitting: Certified Nurse Midwife

## 2018-10-25 ENCOUNTER — Other Ambulatory Visit: Payer: Self-pay

## 2018-10-25 MED ORDER — PAROXETINE HCL 10 MG PO TABS
10.0000 mg | ORAL_TABLET | Freq: Every day | ORAL | 5 refills | Status: DC
Start: 2018-10-25 — End: 2019-04-11

## 2018-10-27 ENCOUNTER — Other Ambulatory Visit: Payer: Self-pay | Admitting: Certified Nurse Midwife

## 2018-10-28 ENCOUNTER — Other Ambulatory Visit: Payer: Self-pay

## 2018-10-28 ENCOUNTER — Other Ambulatory Visit: Payer: Self-pay | Admitting: Certified Nurse Midwife

## 2018-10-28 MED ORDER — PAROXETINE HCL 40 MG PO TABS: 40.0000 mg | ORAL_TABLET | ORAL | 5 refills | Status: DC

## 2018-10-28 NOTE — Telephone Encounter (Signed)
Refill sent per pt request.  

## 2018-11-22 ENCOUNTER — Encounter: Payer: Self-pay | Admitting: Certified Nurse Midwife

## 2018-11-29 DIAGNOSIS — R1084 Generalized abdominal pain: Secondary | ICD-10-CM | POA: Diagnosis not present

## 2018-12-05 ENCOUNTER — Other Ambulatory Visit: Payer: Self-pay | Admitting: Certified Nurse Midwife

## 2018-12-16 ENCOUNTER — Telehealth: Payer: Self-pay

## 2018-12-16 NOTE — Telephone Encounter (Signed)
Called patient after receiving refill request from [facility].  Patient states she has plenty Paxil and refill request from pharmacy is not needed.  Advised patient to call us if she needs anything else.

## 2019-01-17 ENCOUNTER — Encounter: Payer: Self-pay | Admitting: Certified Nurse Midwife

## 2019-04-05 ENCOUNTER — Encounter: Payer: Self-pay | Admitting: Certified Nurse Midwife

## 2019-04-05 NOTE — Telephone Encounter (Signed)
Please contact patient to schedule pregnancy confirmation. Thanks, JML

## 2019-04-08 ENCOUNTER — Telehealth: Payer: Self-pay

## 2019-04-08 NOTE — Telephone Encounter (Signed)
Coronavirus (COVID-19) Are you at risk?  Are you at risk for the Coronavirus (COVID-19)?  To be considered HIGH RISK for Coronavirus (COVID-19), you have to meet the following criteria:  . Traveled to [place], [place], [place], [place] or [place]; or in the [place] to [place], [place], [place], or [place]; and have fever, cough, and shortness of breath within the last 2 weeks of travel OR . Been in close contact with a person diagnosed with COVID-19 within the last 2 weeks and have fever, cough, and shortness of breath . IF YOU DO NOT MEET THESE CRITERIA, YOU ARE CONSIDERED LOW RISK FOR COVID-19.  What to do if you are HIGH RISK for COVID-19?  . If you are having a medical emergency, call 911. . Seek medical care right away. Before you go to a doctor's office, urgent care or emergency department, call ahead and tell them about your recent travel, contact with someone diagnosed with COVID-19, and your symptoms. You should receive instructions from your physician's office regarding next steps of care.  . When you arrive at healthcare provider, tell the healthcare staff immediately you have returned from visiting [place], [place], [place], [place] or [place]; or traveled in the [place] to [place], [place], [place], or [place]; in the last two weeks or you have been in close contact with a person diagnosed with COVID-19 in the last 2 weeks.   . Tell the health care staff about your symptoms: fever, cough and shortness of breath. . After you have been seen by a medical provider, you will be either: o Tested for (COVID-19) and discharged home on quarantine except to seek medical care if symptoms worsen, and asked to  - Stay home and avoid contact with others until you get your results (4-5 days)  - Avoid travel on public transportation if possible (such as bus, train, or airplane) or o Sent to the Emergency Department by EMS for evaluation, COVID-19 testing, and possible  admission depending on your condition and test results.  What to do if you are LOW RISK for COVID-19?  Reduce your risk of any infection by using the same precautions used for avoiding the common cold or flu:  . Wash your hands often with soap and warm water for at least 20 seconds.  If soap and water are not readily available, use an alcohol-based hand sanitizer with at least 60% alcohol.  . If coughing or sneezing, cover your mouth and nose by coughing or sneezing into the elbow areas of your shirt or coat, into a tissue or into your sleeve (not your hands). . Avoid shaking hands with others and consider head nods or verbal greetings only. . Avoid touching your eyes, nose, or mouth with unwashed hands.  . Avoid close contact with people who are Curren Mohrmann. . Avoid places or events with large numbers of people in one location, like concerts or sporting events. . Carefully consider travel plans you have or are making. . If you are planning any travel outside or inside the [place], visit the CDC's Travelers' Health webpage for the latest health notices. . If you have some symptoms but not all symptoms, continue to monitor at home and seek medical attention if your symptoms worsen. . If you are having a medical emergency, call 911.  04/08/19 SCREENING NEG SLS ADDITIONAL HEALTHCARE OPTIONS FOR PATIENTS  [facility] / e-Visit: [email or web address]         [facility]: [phone]    [facility]: [phone]                   [facility]: [phone]  

## 2019-04-11 ENCOUNTER — Ambulatory Visit: Payer: Self-pay | Admitting: Certified Nurse Midwife

## 2019-04-11 ENCOUNTER — Other Ambulatory Visit: Payer: Self-pay

## 2019-04-11 ENCOUNTER — Encounter: Payer: Self-pay | Admitting: Certified Nurse Midwife

## 2019-04-11 VITALS — BP 115/74 | HR 89 | Ht 63.0 in | Wt 158.6 lb

## 2019-04-11 DIAGNOSIS — N912 Amenorrhea, unspecified: Secondary | ICD-10-CM

## 2019-04-11 DIAGNOSIS — Z8751 Personal history of pre-term labor: Secondary | ICD-10-CM

## 2019-04-11 LAB — POCT URINE PREGNANCY: Preg Test, Ur: POSITIVE — AB

## 2019-04-11 NOTE — Progress Notes (Signed)
Patient here for pregnancy confirmation, no complaints.  

## 2019-04-11 NOTE — Progress Notes (Signed)
GYN ENCOUNTER NOTE  Subjective:       Bonnie Graham is a 23 y.o. [id number] female here for pregnancy confirmation.   Reports positive home pregnancy test approximately 14 days after missed period. No symptoms at this time. Still breastfeeding three (3) times per day. Stopped Paxil approximately two (2) months ago.   Taking prenatal vitamin. Denies difficulty breathing or respiratory distress, chest pain, abdominal pain, vaginal bleeding, dysuria, and leg pain or swelling.     Gynecologic History  Patient's last menstrual period was 02/19/2019 (exact date). Period Cycle (Days): 28 Period Duration (Days): 7 Period Pattern: Regular Menstrual Flow: Moderate Menstrual Control: Tampon Dysmenorrhea: (!) Moderate Dysmenorrhea Symptoms: Cramping  Gestational age: 58 weeks 2 days.   Estimated date of birth: 11/26/2019.   Contraception: none  Last Pap: due.   Obstetric History  OB History  Gravida Para Term Preterm AB Living  2 1 0 1 0 1  SAB TAB Ectopic Multiple Live Births  0 0 0 0 1    # Outcome Date GA Lbr Len/2nd Weight Sex Delivery Anes PTL Lv  2 Current           1 Preterm 05/08/18 [id number]  3 lb 1.9 oz (1.415 kg) M Vag-Spont EPI N LIV     Complications: Premature delivery    Past Medical History:  Diagnosis Date  . Heavy periods   . Painful menstrual periods     Past Surgical History:  Procedure Laterality Date  . NO PAST SURGERIES      Current Outpatient Medications on File Prior to Visit  Medication Sig Dispense Refill  . Prenatal Vit-Fe Fumarate-FA (PRENATAL MULTIVITAMIN) TABS tablet Take 1 tablet by mouth daily at 12 noon.     No current facility-administered medications on file prior to visit.     No Known Allergies  Social History   Socioeconomic History  . Marital status: Single    Spouse name: Not on file  . Number of children: Not on file  . Years of education: Not on file  . Highest education level: Not on file  Occupational History  . Not  on file  Social Needs  . Financial resource strain: Not on file  . Food insecurity:    Worry: Not on file    Inability: Not on file  . Transportation needs:    Medical: Not on file    Non-medical: Not on file  Tobacco Use  . Smoking status: Never Smoker  . Smokeless tobacco: Never Used  Substance and Sexual Activity  . Alcohol use: No  . Drug use: No  . Sexual activity: Yes    Birth control/protection: None  Lifestyle  . Physical activity:    Days per week: Not on file    Minutes per session: Not on file  . Stress: Not on file  Relationships  . Social connections:    Talks on phone: Not on file    Gets together: Not on file    Attends religious service: Not on file    Active member of club or organization: Not on file    Attends meetings of clubs or organizations: Not on file    Relationship status: Not on file  . Intimate partner violence:    Fear of current or ex partner: Not on file    Emotionally abused: Not on file    Physically abused: Not on file    Forced sexual activity: Not on file  Other Topics Concern  . Not on  file  Social History Narrative  . Not on file    Family History  Problem Relation Age of Onset  . Heart disease Father   . Endometriosis Sister   . Endometriosis Maternal Grandmother        grt  . Breast cancer Neg Hx   . Ovarian cancer Neg Hx   . Colon cancer Neg Hx   . Diabetes Neg Hx     The following portions of the patient's history were reviewed and updated as appropriate: allergies, current medications, past family history, past medical history, past social history, past surgical history and problem list.  Review of Systems  ROS negative except as noted above. Information obtained from patient.   Objective:   BP 115/74   Pulse 89   Ht 5\' 3"  (1.6 m)   Wt 158 lb 9.6 oz (71.9 kg)   LMP 02/19/2019 (Exact Date)   Breastfeeding Yes   BMI 28.09 kg/m    CONSTITUTIONAL: Well-developed, well-nourished female in no acute distress.    PHYSICAL EXAM: Not indicated.   Recent Results (from the past 2160 hour(s))  POCT urine pregnancy     Status: Abnormal   Collection Time: 04/11/19 10:01 AM  Result Value Ref Range   Preg Test, Ur Positive (A) Negative    Assessment:   1. Amenorrhea  - POCT urine pregnancy - Beta HCG, Quant - Progesterone - US OB LESS THAN 14 WEEKS WITH OB TRANSVAGINAL; Future  2. History of preterm delivery  - Beta HCG, Quant - Progesterone - US OB LESS THAN 14 WEEKS WITH OB TRANSVAGINAL; Future     Plan:   Beta and progesterone today, see orders.   Anticipatory guidance regarding course of prenatal care due to history of preterm birth.   Tips given for breastfeeding in pregnancy, see AVS.   Reviewed red flag symptoms and when to call.   RTC x 1-2 weeks for dating/viability ultrasound and nurse intake.   RTC x 5-6 weeks for NOB PE or sooner if needed.     Gunnar Bulla, CNM [facility], [facility] 04/11/19 3:06 PM

## 2019-04-11 NOTE — Patient Instructions (Signed)
First Trimester of Pregnancy  The first trimester of pregnancy is from week 1 until the end of week 13 (months 1 through 3). During this time, your baby will begin to develop inside you. At 6-8 weeks, the eyes and face are formed, and the heartbeat can be seen on ultrasound. At the end of 12 weeks, all the baby's organs are formed. Prenatal care is all the medical care you receive before the birth of your baby. Make sure you get good prenatal care and follow all of your doctor's instructions. Follow these instructions at home: Medicines  Take over-the-counter and prescription medicines only as told by your doctor. Some medicines are safe and some medicines are not safe during pregnancy.  Take a prenatal vitamin that contains at least 600 micrograms (mcg) of folic acid.  If you have trouble pooping (constipation), take medicine that will make your stool soft (stool softener) if your doctor approves. Eating and drinking   Eat regular, healthy meals.  Your doctor will tell you the amount of weight gain that is right for you.  Avoid raw meat and uncooked cheese.  If you feel sick to your stomach (nauseous) or throw up (vomit): ? Eat 4 or 5 small meals a day instead of 3 large meals. ? Try eating a few soda crackers. ? Drink liquids between meals instead of during meals.  To prevent constipation: ? Eat foods that are high in fiber, like fresh fruits and vegetables, whole grains, and beans. ? Drink enough fluids to keep your pee (urine) clear or pale yellow. Activity  Exercise only as told by your doctor. Stop exercising if you have cramps or pain in your lower belly (abdomen) or low back.  Do not exercise if it is too hot, too humid, or if you are in a place of great height (high altitude).  Try to avoid standing for long periods of time. Move your legs often if you must stand in one place for a long time.  Avoid heavy lifting.  Wear low-heeled shoes. Sit and stand up straight.   You can have sex unless your doctor tells you not to. Relieving pain and discomfort  Wear a good support bra if your breasts are sore.  Take warm water baths (sitz baths) to soothe pain or discomfort caused by hemorrhoids. Use hemorrhoid cream if your doctor says it is okay.  Rest with your legs raised if you have leg cramps or low back pain.  If you have puffy, bulging veins (varicose veins) in your legs: ? Wear support hose or compression stockings as told by your doctor. ? Raise (elevate) your feet for 15 minutes, 3-4 times a day. ? Limit salt in your food. Prenatal care  Schedule your prenatal visits by the twelfth week of pregnancy.  Write down your questions. Take them to your prenatal visits.  Keep all your prenatal visits as told by your doctor. This is important. Safety  Wear your seat belt at all times when driving.  Make a list of emergency phone numbers. The list should include numbers for family, friends, the hospital, and police and fire departments. General instructions  Ask your doctor for a referral to a local prenatal class. Begin classes no later than at the start of month 6 of your pregnancy.  Ask for help if you need counseling or if you need help with nutrition. Your doctor can give you advice or tell you where to go for help.  Do not use hot tubs, steam   rooms, or saunas.  Do not douche or use tampons or scented sanitary pads.  Do not cross your legs for long periods of time.  Avoid all herbs and alcohol. Avoid drugs that are not approved by your doctor.  Do not use any tobacco products, including cigarettes, chewing tobacco, and electronic cigarettes. If you need help quitting, ask your doctor. You may get counseling or other support to help you quit.  Avoid cat litter boxes and soil used by cats. These carry germs that can cause birth defects in the baby and can cause a loss of your baby (miscarriage) or stillbirth.  Visit your dentist. At home,  brush your teeth with a soft toothbrush. Be gentle when you floss. Contact a doctor if:  You are dizzy.  You have mild cramps or pressure in your lower belly.  You have a nagging pain in your belly area.  You continue to feel sick to your stomach, you throw up, or you have watery poop (diarrhea).  You have a bad smelling fluid coming from your vagina.  You have pain when you pee (urinate).  You have increased puffiness (swelling) in your face, hands, legs, or ankles. Get help right away if:  You have a fever.  You are leaking fluid from your vagina.  You have spotting or bleeding from your vagina.  You have very bad belly cramping or pain.  You gain or lose weight rapidly.  You throw up blood. It may look like coffee grounds.  You are around people who have [place] measles, fifth disease, or chickenpox.  You have a very bad headache.  You have shortness of breath.  You have any kind of trauma, such as from a fall or a car accident. Summary  The first trimester of pregnancy is from week 1 until the end of week 13 (months 1 through 3).  To take care of yourself and your unborn baby, you will need to eat healthy meals, take medicines only if your doctor tells you to do so, and do activities that are safe for you and your baby.  Keep all follow-up visits as told by your doctor. This is important as your doctor will have to ensure that your baby is healthy and growing well. This information is not intended to replace advice given to you by your health care provider. Make sure you discuss any questions you have with your health care provider. Document Released: 04/14/2008 Document Revised: 11/04/2016 Document Reviewed: 11/04/2016 Elsevier Interactive Patient Education  2019 [employer].   Preventing Preterm Birth Preterm birth is when your baby is delivered between 20 weeks and 37 weeks of pregnancy. A full-term pregnancy lasts for at least 37 weeks. Preterm birth can be  dangerous for your baby because the last few weeks of pregnancy are an important time for your baby's brain and lungs to grow. Many things can cause a baby to be born early. Sometimes the cause is not known. There are certain factors that make you more likely to experience preterm birth, such as:  Having a previous baby born preterm.  Being pregnant with twins or other multiples.  Having had fertility treatment.  Being overweight or underweight at the start of your pregnancy.  Having any of the following during pregnancy: ? An infection, including a urinary tract infection (UTI) or an STI (sexually transmitted infection). ? High blood pressure. ? Diabetes. ? Vaginal bleeding.  Being age 59 or older.  Being age 59 or younger.  Getting pregnant  within 6 months of a previous pregnancy.  Suffering extreme stress or physical or emotional abuse during pregnancy.  Standing for long periods of time during pregnancy, such as working at a job that requires standing. What are the risks? The most serious risk of preterm birth is that the baby may not survive. This is more likely to happen if a baby is born before 34 weeks. Other risks and complications of preterm birth may include your baby having:  Breathing problems.  Brain damage that affects movement and coordination (cerebral palsy).  Feeding difficulties.  Vision or hearing problems.  Infections or inflammation of the digestive tract (colitis).  Developmental delays.  Learning disabilities.  Higher risk for diabetes, heart disease, and high blood pressure later in life. What can I do to lower my risk?  Medical care The most important thing you can do to lower your risk for preterm birth is to get routine medical care during pregnancy (prenatal care). If you have a high risk of preterm birth, you may be referred to a health care provider who specializes in managing high-risk pregnancies (perinatologist). You may be given  medicine to help prevent preterm birth. Lifestyle changes Certain lifestyle changes can also lower your risk of preterm birth:  Wait at least 6 months after a pregnancy to become pregnant again.  Try to plan pregnancy for when you are between 96 and 34 years old.  Get to a healthy weight before getting pregnant. If you are overweight, work with your health care provider to safely lose weight.  Do not use any products that contain nicotine or tobacco, such as cigarettes and e-cigarettes. If you need help quitting, ask your health care provider.  Do not drink alcohol.  Do not use drugs. Where to find support For more support, consider:  Talking with your health care provider.  Talking with a therapist or substance abuse counselor, if you need help quitting.  Working with a diet and nutrition specialist (dietitian) or a Systems analyst to maintain a healthy weight.  Joining a support group. Where to find more information Learn more about preventing preterm birth from:  Centers for Disease Control and Prevention: http://curry.org/  March of Dimes: marchofdimes.org/complications/premature-babies.aspx  American Pregnancy Association: americanpregnancy.org/labor-and-birth/premature-labor Contact a health care provider if:  You have any of the following signs of preterm labor before 37 weeks: ? A change or increase in vaginal discharge. ? Fluid leaking from your vagina. ? Pressure or cramps in your lower abdomen. ? A backache that does not go away or gets worse. ? Regular tightening (contractions) in your lower abdomen. Summary  Preterm birth means having your baby during weeks 20-37 of pregnancy.  Preterm birth may put your baby at risk for physical and mental problems.  Getting good prenatal care can help prevent preterm birth.  You can lower your risk of preterm birth by making certain lifestyle changes, such as not smoking  and not using alcohol. This information is not intended to replace advice given to you by your health care provider. Make sure you discuss any questions you have with your health care provider. Document Released: 12/11/2015 Document Revised: 07/05/2016 Document Reviewed: 07/05/2016 Elsevier Interactive Patient Education  2019 Elsevier Inc. Hydroxyprogesterone caproate injection for pregnancy What is this medicine? HYDROXYPROGESTERONE (hye drox ee proe JES ter one) is a female hormone. This medicine is used in women who are pregnant and who have delivered a baby too early (preterm) in the past. It helps lower the risk of having a  preterm baby again. This medicine may be used for other purposes; ask your health care provider or pharmacist if you have questions. COMMON BRAND NAME(S): Makena What should I tell my health care provider before I take this medicine? They need to know if you have any of these conditions: -breast, cervical, uterine, or vaginal cancer -depression -diabetes or prediabetes -heart disease -high blood pressure -history of blood clots -kidney disease -liver disease -lung or breathing disease, like asthma -migraine headaches -seizures -vaginal bleeding -an unusual or allergic reaction to hydroxyprogesterone, other hormones, castor oil, benzyl alcohol, other medicines, foods, dyes, or preservatives -breast-feeding How should I use this medicine? This medicine is for injection into a muscle or under the skin. You will receive an injection once every week (every 7 days) as directed during your pregnancy. It is given by a health care professional in a hospital or clinic setting. Talk to your pediatrician regarding the use of this medicine in children. While this drug may be prescribed for pregnant women as young as 16 years, precautions do apply. Overdosage: If you think you have taken too much of this medicine contact a poison control center or emergency room at once.  NOTE: This medicine is only for you. Do not share this medicine with others. What if I miss a dose? It is important not to miss your dose. Call your doctor or health care professional if you are unable to keep an appointment. What may interact with this medicine? Significant interactions are not expected. This list may not describe all possible interactions. Give your health care provider a list of all the medicines, herbs, non-prescription drugs, or dietary supplements you use. Also tell them if you smoke, drink alcohol, or use illegal drugs. Some items may interact with your medicine. What should I watch for while using this medicine? Your pregnancy will be monitored carefully while you are receiving this medicine. What side effects may I notice from receiving this medicine? Side effects that you should report to your doctor or health care professional as soon as possible: -allergic reactions like skin rash, itching or hives, swelling of the face, lips, or tongue -breathing problems -depressed mood -increase in blood pressure -increased hunger or thirst -increased urination -signs and symptoms of a blood clot such as breathing problems; changes in vision; chest pain; severe, sudden headache; pain, swelling, warmth in the leg; trouble speaking; sudden numbness or weakness of the face, arm or leg -unusually weak or tired -unusual vaginal bleeding -yellowing of the eyes or skin Side effects that usually do not require medical attention (report to your doctor or health care professional if they continue or are bothersome): -diarrhea -fluid retention and swelling -nausea -pain, redness, or irritation at site where injected This list may not describe all possible side effects. Call your doctor for medical advice about side effects. You may report side effects to FDA at 1-800-FDA-1088. Where should I keep my medicine? This drug is given in a hospital or clinic and will not be stored at home.  NOTE: This sheet is a summary. It may not cover all possible information. If you have questions about this medicine, talk to your doctor, pharmacist, or health care provider.  2019 Elsevier/Gold Standard (2017-07-12 11:14:47)  Breastfeeding During Pregnancy Deciding whether to continue breastfeeding during a pregnancy is an individual choice. Breastfeeding during pregnancy is generally not risky. Your nursing child may naturally stop breastfeeding (wean) during your pregnancy. If you have problems during pregnancy, you may be advised to stop breastfeeding.  Work with your health care provider to help decide if breastfeeding during pregnancy is right for you. What should I consider when deciding whether to breastfeed during pregnancy? When deciding whether to continue breastfeeding while you are pregnant, you may want to consider:  The age of your nursing child and his or her physical and emotional needs.  Any health concerns related to your pregnancy. It may not be safe to continue breastfeeding if you have certain problems, such as: ? Uterine pain or bleeding. ? A history of preterm labor and delivery. ? Problems gaining weight or losing weight during pregnancy. ? A history of cervical insufficiency. This is a condition in which the cervix begins to thin and soften before your due date.  Whether you have any problems associated with breastfeeding. Some common problems experienced when breastfeeding during pregnancy include: ? Nipple tenderness and breast soreness. ? Nausea. ? Discomfort while breastfeeding due to the growing belly. ? Fatigue. ? Reduced milk supply. This may mean having fewer feedings a day. ? Changes in how your milk tastes. ? Uterine contractions. Follow these instructions at home:   Keep an open mind about how your breastfeeding experience will be. Avoid setting rigid expectations for yourself. Your needs and your nursing child's needs are likely to change as your  pregnancy progresses.  Make sure that you are gaining a healthy amount of weight and eating enough calories.  Eat a healthy diet that includes fresh fruits and vegetables, whole grains, lean meat, fish, eggs, beans, nuts, and seeds, and low-fat dairy products.  Drink plenty of fluids so your urine is clear or pale yellow.  Work with your health care provider and your child's health care provider as needed.  Keep track of your nursing child's weight. If your nursing baby is younger than twelve months, the normal decrease of your milk supply that happens during pregnancy could keep your baby from getting all the milk he or she needs.  Keep track of your nursing child's daily wet diapers and bowel movements to make sure he or she is staying hydrated. Your baby should have 6-8 wet diapers and at least 3 stools each day.  Talk to your health care provider or lactation specialist before starting any new medications or supplements. This is to make sure that they are safe for both pregnancy and breastfeeding. Where to find more information  [employer] International: http://www.llli.org Contact a health care provider if:  Your breasts become large and painful (engorged).  Your nursing child is urinating or having bowel movements less often than normal. Summary  Breastfeeding during pregnancy is generally not risky. However, if you have problems during pregnancy, you may be advised to stop breastfeeding.  During pregnancy, your milk supply naturally decreases. Your nursing child may wean himself or herself naturally during your pregnancy.  Keep track of your nursing child's weight, wet diapers, and stools to make sure that he or she is getting enough milk.  Keep an open mind about how your breastfeeding experience will be. Avoid setting rigid expectations for yourself. This information is not intended to replace advice given to you by your health care provider. Make sure you discuss any  questions you have with your health care provider. Document Released: 02/24/2005 Document Revised: 10/28/2016 Document Reviewed: 10/28/2016 Elsevier Interactive Patient Education  2019 [employer]

## 2019-04-12 LAB — BETA HCG QUANT (REF LAB): hCG Quant: 5896 m[IU]/mL

## 2019-04-12 LAB — PROGESTERONE: Progesterone: 18.8 ng/mL

## 2019-04-19 ENCOUNTER — Other Ambulatory Visit: Payer: Self-pay

## 2019-04-19 ENCOUNTER — Ambulatory Visit (INDEPENDENT_AMBULATORY_CARE_PROVIDER_SITE_OTHER): Payer: Self-pay | Admitting: Certified Nurse Midwife

## 2019-04-19 ENCOUNTER — Ambulatory Visit (INDEPENDENT_AMBULATORY_CARE_PROVIDER_SITE_OTHER): Payer: Self-pay

## 2019-04-19 VITALS — BP 89/68 | HR 80 | Ht 63.0 in | Wt 156.2 lb

## 2019-04-19 DIAGNOSIS — Z8751 Personal history of pre-term labor: Secondary | ICD-10-CM

## 2019-04-19 DIAGNOSIS — N912 Amenorrhea, unspecified: Secondary | ICD-10-CM

## 2019-04-19 DIAGNOSIS — Z3687 Encounter for antenatal screening for uncertain dates: Secondary | ICD-10-CM

## 2019-04-19 DIAGNOSIS — Z3A01 Less than 8 weeks gestation of pregnancy: Secondary | ICD-10-CM

## 2019-04-19 NOTE — Patient Instructions (Signed)
First Trimester of Pregnancy  The first trimester of pregnancy is from week 1 until the end of week 13 (months 1 through 3). During this time, your baby will begin to develop inside you. At 6-8 weeks, the eyes and face are formed, and the heartbeat can be seen on ultrasound. At the end of 12 weeks, all the baby's organs are formed. Prenatal care is all the medical care you receive before the birth of your baby. Make sure you get good prenatal care and follow all of your doctor's instructions. Follow these instructions at home: Medicines  Take over-the-counter and prescription medicines only as told by your doctor. Some medicines are safe and some medicines are not safe during pregnancy.  Take a prenatal vitamin that contains at least 600 micrograms (mcg) of folic acid.  If you have trouble pooping (constipation), take medicine that will make your stool soft (stool softener) if your doctor approves. Eating and drinking   Eat regular, healthy meals.  Your doctor will tell you the amount of weight gain that is right for you.  Avoid raw meat and uncooked cheese.  If you feel sick to your stomach (nauseous) or throw up (vomit): ? Eat 4 or 5 small meals a day instead of 3 large meals. ? Try eating a few soda crackers. ? Drink liquids between meals instead of during meals.  To prevent constipation: ? Eat foods that are high in fiber, like fresh fruits and vegetables, whole grains, and beans. ? Drink enough fluids to keep your pee (urine) clear or pale yellow. Activity  Exercise only as told by your doctor. Stop exercising if you have cramps or pain in your lower belly (abdomen) or low back.  Do not exercise if it is too hot, too humid, or if you are in a place of great height (high altitude).  Try to avoid standing for long periods of time. Move your legs often if you must stand in one place for a long time.  Avoid heavy lifting.  Wear low-heeled shoes. Sit and stand up straight.   You can have sex unless your doctor tells you not to. Relieving pain and discomfort  Wear a good support bra if your breasts are sore.  Take warm water baths (sitz baths) to soothe pain or discomfort caused by hemorrhoids. Use hemorrhoid cream if your doctor says it is okay.  Rest with your legs raised if you have leg cramps or low back pain.  If you have puffy, bulging veins (varicose veins) in your legs: ? Wear support hose or compression stockings as told by your doctor. ? Raise (elevate) your feet for 15 minutes, 3-4 times a day. ? Limit salt in your food. Prenatal care  Schedule your prenatal visits by the twelfth week of pregnancy.  Write down your questions. Take them to your prenatal visits.  Keep all your prenatal visits as told by your doctor. This is important. Safety  Wear your seat belt at all times when driving.  Make a list of emergency phone numbers. The list should include numbers for family, friends, the hospital, and police and fire departments. General instructions  Ask your doctor for a referral to a local prenatal class. Begin classes no later than at the start of month 6 of your pregnancy.  Ask for help if you need counseling or if you need help with nutrition. Your doctor can give you advice or tell you where to go for help.  Do not use hot tubs, steam   rooms, or saunas.  Do not douche or use tampons or scented sanitary pads.  Do not cross your legs for long periods of time.  Avoid all herbs and alcohol. Avoid drugs that are not approved by your doctor.  Do not use any tobacco products, including cigarettes, chewing tobacco, and electronic cigarettes. If you need help quitting, ask your doctor. You may get counseling or other support to help you quit.  Avoid cat litter boxes and soil used by cats. These carry germs that can cause birth defects in the baby and can cause a loss of your baby (miscarriage) or stillbirth.  Visit your dentist. At home,  brush your teeth with a soft toothbrush. Be gentle when you floss. Contact a doctor if:  You are dizzy.  You have mild cramps or pressure in your lower belly.  You have a nagging pain in your belly area.  You continue to feel sick to your stomach, you throw up, or you have watery poop (diarrhea).  You have a bad smelling fluid coming from your vagina.  You have pain when you pee (urinate).  You have increased puffiness (swelling) in your face, hands, legs, or ankles. Get help right away if:  You have a fever.  You are leaking fluid from your vagina.  You have spotting or bleeding from your vagina.  You have very bad belly cramping or pain.  You gain or lose weight rapidly.  You throw up blood. It may look like coffee grounds.  You are around people who have German measles, fifth disease, or chickenpox.  You have a very bad headache.  You have shortness of breath.  You have any kind of trauma, such as from a fall or a car accident. Summary  The first trimester of pregnancy is from week 1 until the end of week 13 (months 1 through 3).  To take care of yourself and your unborn baby, you will need to eat healthy meals, take medicines only if your doctor tells you to do so, and do activities that are safe for you and your baby.  Keep all follow-up visits as told by your doctor. This is important as your doctor will have to ensure that your baby is healthy and growing well. This information is not intended to replace advice given to you by your health care provider. Make sure you discuss any questions you have with your health care provider. Document Released: 04/14/2008 Document Revised: 11/04/2016 Document Reviewed: 11/04/2016 Elsevier Interactive Patient Education  2019 [employer]  

## 2019-04-19 NOTE — Progress Notes (Signed)
I have reviewed the record and concur with patient management and plan of care.    Diona Fanti, CNM [facility], [facility] 04/19/19 1:38 PM

## 2019-04-19 NOTE — Progress Notes (Signed)
Bonnie Graham presents for NOB nurse interview visit. Pregnancy confirmation done here at [facility].  G-2 .  P- 1   . Pregnancy education material explained and given. _No__ cats in the home. NOB labs ordered. Marland Kitchen HIV labs and Drug screen were explained optional and she did not decline. Drug screen ordered/ PNV encouraged. Genetic screening options discussed. Genetic testing: .  Pt does not work outside of the home, she is applying for pregnancy medicaid, she will follow up in 4 weeks for NOB PE, all questions answered, pt voiced understanding

## 2019-04-20 LAB — HGB SOLU + RFLX FRAC: Sickle Solubility Test - HGBRFX: NEGATIVE

## 2019-04-20 LAB — VARICELLA ZOSTER ANTIBODY, IGG: Varicella zoster IgG: <135 index — ABNORMAL LOW (ref >165)

## 2019-04-20 LAB — URINALYSIS, ROUTINE W REFLEX MICROSCOPIC
Bilirubin, UA: NEGATIVE
Glucose, UA: NEGATIVE
Ketones, UA: NEGATIVE
Leukocytes,UA: NEGATIVE
Nitrite, UA: NEGATIVE
Protein,UA: NEGATIVE
RBC, UA: NEGATIVE
Specific Gravity, UA: 1.028 (ref 1.005–1.030)
Urobilinogen, Ur: 0.2 mg/dL (ref 0.2–1.0)
pH, UA: 6 (ref 5.0–7.5)

## 2019-04-20 LAB — HEPATITIS B SURFACE ANTIGEN: Hepatitis B Surface Ag: NEGATIVE

## 2019-04-20 LAB — RUBELLA SCREEN: Rubella Antibodies, IGG: 1.18 index (ref >0.99)

## 2019-04-20 LAB — ABO AND RH: Rh Factor: POSITIVE

## 2019-04-20 LAB — RPR: RPR Ser Ql: NONREACTIVE

## 2019-04-20 LAB — HIV ANTIBODY (ROUTINE TESTING W REFLEX): HIV Screen 4th Generation wRfx: NONREACTIVE

## 2019-04-20 LAB — ANTIBODY SCREEN: Antibody Screen: NEGATIVE

## 2019-04-21 LAB — MONITOR DRUG PROFILE 14(MW)
Amphetamine Scrn, Ur: NEGATIVE ng/mL
BARBITURATE SCREEN URINE: NEGATIVE ng/mL
BENZODIAZEPINE SCREEN, URINE: NEGATIVE ng/mL
Buprenorphine, Urine: NEGATIVE ng/mL
CANNABINOIDS UR QL SCN: NEGATIVE ng/mL
Cocaine (Metab) Scrn, Ur: NEGATIVE ng/mL
Creatinine(Crt), U: 180.5 mg/dL (ref 20.0–300.0)
Fentanyl, Urine: NEGATIVE pg/mL
Meperidine Screen, Urine: NEGATIVE ng/mL
Methadone Screen, Urine: NEGATIVE ng/mL
OXYCODONE+OXYMORPHONE UR QL SCN: NEGATIVE ng/mL
Opiate Scrn, Ur: NEGATIVE ng/mL
Ph of Urine: 6.1 (ref 4.5–8.9)
Phencyclidine Qn, Ur: NEGATIVE ng/mL
Propoxyphene Scrn, Ur: NEGATIVE ng/mL
SPECIFIC GRAVITY: 1.035
Tramadol Screen, Urine: NEGATIVE ng/mL

## 2019-04-21 LAB — URINE CULTURE: Organism ID, Bacteria: NO GROWTH

## 2019-04-22 ENCOUNTER — Encounter: Payer: Self-pay | Admitting: Certified Nurse Midwife

## 2019-04-27 LAB — GC/CHLAMYDIA PROBE AMP
Chlamydia trachomatis, NAA: NEGATIVE
Neisseria Gonorrhoeae by PCR: NEGATIVE

## 2019-04-29 ENCOUNTER — Encounter (HOSPITAL_COMMUNITY): Payer: Self-pay

## 2019-05-16 ENCOUNTER — Telehealth: Payer: Self-pay

## 2019-05-16 NOTE — Telephone Encounter (Signed)
Coronavirus (COVID-19) Are you at risk?  Are you at risk for the Coronavirus (COVID-19)?  To be considered HIGH RISK for Coronavirus (COVID-19), you have to meet the following criteria:  . Traveled to [place], [place], [place], [place] or [place]; or in the [place] to [place], [place], [place], or [place]; and have fever, cough, and shortness of breath within the last 2 weeks of travel OR . Been in close contact with a person diagnosed with COVID-19 within the last 2 weeks and have fever, cough, and shortness of breath . IF YOU DO NOT MEET THESE CRITERIA, YOU ARE CONSIDERED LOW RISK FOR COVID-19.  What to do if you are HIGH RISK for COVID-19?  . If you are having a medical emergency, call 911. . Seek medical care right away. Before you go to a doctor's office, urgent care or emergency department, call ahead and tell them about your recent travel, contact with someone diagnosed with COVID-19, and your symptoms. You should receive instructions from your physician's office regarding next steps of care.  . When you arrive at healthcare provider, tell the healthcare staff immediately you have returned from visiting [place], [place], [place], [place] or [place]; or traveled in the [place] to [place], [place], [place], or [place]; in the last two weeks or you have been in close contact with a person diagnosed with COVID-19 in the last 2 weeks.   . Tell the health care staff about your symptoms: fever, cough and shortness of breath. . After you have been seen by a medical provider, you will be either: o Tested for (COVID-19) and discharged home on quarantine except to seek medical care if symptoms worsen, and asked to  - Stay home and avoid contact with others until you get your results (4-5 days)  - Avoid travel on public transportation if possible (such as bus, train, or airplane) or o Sent to the Emergency Department by EMS for evaluation, COVID-19 testing, and possible  admission depending on your condition and test results.  What to do if you are LOW RISK for COVID-19?  Reduce your risk of any infection by using the same precautions used for avoiding the common cold or flu:  . Wash your hands often with soap and warm water for at least 20 seconds.  If soap and water are not readily available, use an alcohol-based hand sanitizer with at least 60% alcohol.  . If coughing or sneezing, cover your mouth and nose by coughing or sneezing into the elbow areas of your shirt or coat, into a tissue or into your sleeve (not your hands). . Avoid shaking hands with others and consider head nods or verbal greetings only. . Avoid touching your eyes, nose, or mouth with unwashed hands.  . Avoid close contact with people who are sick. . Avoid places or events with large numbers of people in one location, like concerts or sporting events. . Carefully consider travel plans you have or are making. . If you are planning any travel outside or inside the [place], visit the CDC's Travelers' Health webpage for the latest health notices. . If you have some symptoms but not all symptoms, continue to monitor at home and seek medical attention if your symptoms worsen. . If you are having a medical emergency, call 911.   ADDITIONAL HEALTHCARE OPTIONS FOR PATIENTS  [facility] / e-Visit: [email or web address]         [facility]: [phone]  [facility]: [phone]                   [facility]: [phone]   Pre-screen negative, DM.   

## 2019-05-17 ENCOUNTER — Other Ambulatory Visit: Payer: Self-pay

## 2019-05-17 ENCOUNTER — Encounter: Payer: Self-pay | Admitting: Certified Nurse Midwife

## 2019-05-17 ENCOUNTER — Ambulatory Visit (INDEPENDENT_AMBULATORY_CARE_PROVIDER_SITE_OTHER): Payer: Medicaid Other | Admitting: Certified Nurse Midwife

## 2019-05-17 VITALS — BP 104/70 | HR 82 | Wt 154.2 lb

## 2019-05-17 DIAGNOSIS — M5431 Sciatica, right side: Secondary | ICD-10-CM

## 2019-05-17 DIAGNOSIS — Z3491 Encounter for supervision of normal pregnancy, unspecified, first trimester: Secondary | ICD-10-CM

## 2019-05-17 DIAGNOSIS — O09211 Supervision of pregnancy with history of pre-term labor, first trimester: Secondary | ICD-10-CM

## 2019-05-17 DIAGNOSIS — O9989 Other specified diseases and conditions complicating pregnancy, childbirth and the puerperium: Secondary | ICD-10-CM | POA: Diagnosis not present

## 2019-05-17 DIAGNOSIS — Z3A11 11 weeks gestation of pregnancy: Secondary | ICD-10-CM

## 2019-05-17 DIAGNOSIS — Z130 Encounter for screening for diseases of the blood and blood-forming organs and certain disorders involving the immune mechanism: Secondary | ICD-10-CM

## 2019-05-17 DIAGNOSIS — Z8751 Personal history of pre-term labor: Secondary | ICD-10-CM

## 2019-05-17 DIAGNOSIS — M5432 Sciatica, left side: Secondary | ICD-10-CM

## 2019-05-17 LAB — POCT URINALYSIS DIPSTICK OB
Bilirubin, UA: NEGATIVE
Glucose, UA: NEGATIVE
Ketones, UA: NEGATIVE
Nitrite, UA: NEGATIVE
POC,PROTEIN,UA: NEGATIVE
Spec Grav, UA: >=1.03 — AB (ref 1.010–1.025)
Urobilinogen, UA: 0.2 E.U./dL
pH, UA: 7 (ref 5.0–8.0)

## 2019-05-17 NOTE — Progress Notes (Addendum)
NEW OB HISTORY AND PHYSICAL  SUBJECTIVE:       Bonnie Graham is a 23 y.o. G26P0101 female, Patient's last menstrual period was 02/28/2019., Estimated Date of Delivery: 12/05/19, [redacted]w[redacted]d, presents today for establishment of Prenatal Care.  Reports left sided sciatica pain. Still breastfeeding oldest child.   Denies difficulty breathing or respiratory distress, chest pain, abdominal pain, dysuria, vaginal bleeding, and leg pain or swelling.   Desires genetic screening. Prefers midwifery care.   History significant for preterm birth at 54 weeks. Family history signification for first degree relative with cardiomyopathy.    Gynecologic History  Patient's last menstrual period was 02/28/2019.  Period Cycle (Days): 28 Period Duration (Days): 7 Period Pattern: Regular Menstrual Flow: Moderate Menstrual Control: Tampon Dysmenorrhea: (!) Moderate Dysmenorrhea Symptoms: Cramping  Contraception: none  Last Pap: due, prefers postpartum pap smear  Obstetric History  OB History  Gravida Para Term Preterm AB Living  2 1 0 1 0 1  SAB TAB Ectopic Multiple Live Births  0 0 0 0 1    # Outcome Date GA Lbr Len/2nd Weight Sex Delivery Anes PTL Lv  2 Current           1 Preterm 05/08/18 [redacted]w[redacted]d  3 lb 1.9 oz (1.415 kg) M Vag-Spont EPI N LIV     Complications: Premature delivery    Past Medical History:  Diagnosis Date  . Heavy periods   . Painful menstrual periods     Past Surgical History:  Procedure Laterality Date  . NO PAST SURGERIES      Current Outpatient Medications on File Prior to Visit  Medication Sig Dispense Refill  . Prenatal Vit-Fe Fumarate-FA (PRENATAL MULTIVITAMIN) TABS tablet Take 1 tablet by mouth daily at 12 noon.     No current facility-administered medications on file prior to visit.     No Known Allergies  Social History   Socioeconomic History  . Marital status: Single    Spouse name: Not on file  . Number of children: Not on file  . Years of  education: Not on file  . Highest education level: Not on file  Occupational History  . Not on file  Social Needs  . Financial resource strain: Not on file  . Food insecurity    Worry: Not on file    Inability: Not on file  . Transportation needs    Medical: Not on file    Non-medical: Not on file  Tobacco Use  . Smoking status: Never Smoker  . Smokeless tobacco: Never Used  Substance and Sexual Activity  . Alcohol use: No  . Drug use: No  . Sexual activity: Yes    Birth control/protection: None  Lifestyle  . Physical activity    Days per week: Not on file    Minutes per session: Not on file  . Stress: Not on file  Relationships  . Social Herbalist on phone: Not on file    Gets together: Not on file    Attends religious service: Not on file    Active member of club or organization: Not on file    Attends meetings of clubs or organizations: Not on file    Relationship status: Not on file  . Intimate partner violence    Fear of current or ex partner: Not on file    Emotionally abused: Not on file    Physically abused: Not on file    Forced sexual activity: Not on file  Other Topics  Concern  . Not on file  Social History Narrative  . Not on file    Family History  Problem Relation Age of Onset  . Heart disease Father   . Endometriosis Sister   . Endometriosis Maternal Grandmother        grt  . Breast cancer Neg Hx   . Ovarian cancer Neg Hx   . Colon cancer Neg Hx   . Diabetes Neg Hx     The following portions of the patient's history were reviewed and updated as appropriate: allergies, current medications, past OB history, past medical history, past surgical history, past family history, past social history, and problem list.  Review of Systems:  ROS negative except as noted above. Information obtained from patient.   OBJECTIVE:  BP 104/70   Pulse 82   Wt 154 lb 3.2 oz (69.9 kg)   LMP 02/28/2019   BMI 27.32 kg/m   Initial Physical Exam  (New OB)  GENERAL APPEARANCE: alert, well appearing, in no apparent distress  HEAD: normocephalic, atraumatic  MOUTH: mucous membranes moist, pharynx normal without lesions  THYROID: no thyromegaly or masses present  BREASTS: no masses noted, no significant tenderness, no palpable axillary nodes, no skin changes, lactating  LUNGS: clear to auscultation, no wheezes, rales or rhonchi, symmetric air entry  HEART: regular rate and rhythm, no murmurs  ABDOMEN: soft, nontender, nondistended, no abnormal masses, no epigastric pain and FHT present  EXTREMITIES: no redness or tenderness in the calves or thighs, no edema  SKIN: normal coloration and turgor, no rashes  LYMPH NODES: no adenopathy palpable  NEUROLOGIC: alert, oriented, normal speech, no focal findings or movement disorder noted  PELVIC EXAM EXTERNAL GENITALIA: normal appearing vulva with no masses, tenderness or lesions VAGINA: no abnormal discharge or lesions CERVIX: no lesions or cervical motion tenderness UTERUS: gravid and consistent with 11 weeks ADNEXA: no masses palpable and nontender  ASSESSMENT: Normal pregnancy Rh positive Varicella non immune History preterm birth-Agrees to 17P and cervical length [place] q 4 wks Family history of cardiomyopathy-declines cardiology referral Desires genetic screening-[place] today Sciatica pain-referral to Physical Therapy  PLAN: Prenatal care New OB counseling: The patient has been given an overview regarding routine prenatal care. Recommendations regarding diet, weight gain, and exercise in pregnancy were given. Prenatal testing, optional genetic testing, and ultrasound use in pregnancy were reviewed.  Benefits of Breast Feeding were discussed. The patient is encouraged to consider nursing her baby post partum. See orders   Gunnar BullaJenkins Michelle Alejandra Hunt, CNM [facility], [facility] 05/17/19 5:14 PM

## 2019-05-17 NOTE — Progress Notes (Signed)
Patient here for NOB physical.  Patient c/o constant sciatic nerve pain, taking Tylenol with no relief.

## 2019-05-17 NOTE — Patient Instructions (Signed)
Breastfeeding During Pregnancy Deciding whether to continue breastfeeding during a pregnancy is an individual choice. Breastfeeding during pregnancy is generally not risky. Your nursing child may naturally stop breastfeeding (wean) during your pregnancy. If you have problems during pregnancy, you may be advised to stop breastfeeding. Work with your health care provider to help decide if breastfeeding during pregnancy is right for you. What should I consider when deciding whether to breastfeed during pregnancy? When deciding whether to continue breastfeeding while you are pregnant, you may want to consider:  The age of your nursing child and his or her physical and emotional needs.  Any health concerns related to your pregnancy. It may not be safe to continue breastfeeding if you have certain problems, such as: ? Uterine pain or bleeding. ? A history of preterm labor and delivery. ? Problems gaining weight or losing weight during pregnancy. ? A history of cervical insufficiency. This is a condition in which the cervix begins to thin and soften before your due date.  Whether you have any problems associated with breastfeeding. Some common problems experienced when breastfeeding during pregnancy include: ? Nipple tenderness and breast soreness. ? Nausea. ? Discomfort while breastfeeding due to the growing belly. ? Fatigue. ? Reduced milk supply. This may mean having fewer feedings a day. ? Changes in how your milk tastes. ? Uterine contractions. Follow these instructions at home:   Keep an open mind about how your breastfeeding experience will be. Avoid setting rigid expectations for yourself. Your needs and your nursing child's needs are likely to change as your pregnancy progresses.  Make sure that you are gaining a healthy amount of weight and eating enough calories.  Eat a healthy diet that includes fresh fruits and vegetables, whole grains, lean meat, fish, eggs, beans, nuts, and  seeds, and low-fat dairy products.  Drink plenty of fluids so your urine is clear or pale yellow.  Work with your health care provider and your child's health care provider as needed.  Keep track of your nursing child's weight. If your nursing baby is younger than twelve months, the normal decrease of your milk supply that happens during pregnancy could keep your baby from getting all the milk he or she needs.  Keep track of your nursing child's daily wet diapers and bowel movements to make sure he or she is staying hydrated. Your baby should have 6-8 wet diapers and at least 3 stools each day.  Talk to your health care provider or lactation specialist before starting any new medications or supplements. This is to make sure that they are safe for both pregnancy and breastfeeding. Where to find more information  [employer]: http://www.llli.org Contact a health care provider if:  Your breasts become large and painful (engorged).  Your nursing child is urinating or having bowel movements less often than normal. Summary  Breastfeeding during pregnancy is generally not risky. However, if you have problems during pregnancy, you may be advised to stop breastfeeding.  During pregnancy, your milk supply naturally decreases. Your nursing child may wean himself or herself naturally during your pregnancy.  Keep track of your nursing child's weight, wet diapers, and stools to make sure that he or she is getting enough milk.  Keep an open mind about how your breastfeeding experience will be. Avoid setting rigid expectations for yourself. This information is not intended to replace advice given to you by your health care provider. Make sure you discuss any questions you have with your health care provider.  Document Released: 02/24/2005 Document Revised: 03/03/2017 Document Reviewed: 10/28/2016 Elsevier Patient Education  2020 [place] for Pregnant Women While you  are pregnant, your body requires additional nutrition to help support your growing baby. You also have a higher need for some vitamins and minerals, such as folic acid, calcium, iron, and vitamin D. Eating a healthy, well-balanced diet is very important for your health and your baby's health. Your need for extra calories varies for the three 80-monthsegments of your pregnancy (trimesters). For most women, it is recommended to consume:  150 extra calories a day during the first trimester.  300 extra calories a day during the second trimester.  300 extra calories a day during the third trimester. What are tips for following this plan?   Do not try to lose weight or go on a diet during pregnancy.  Limit your overall intake of foods that have "empty calories." These are foods that have little nutritional value, such as sweets, desserts, candies, and sugar-sweetened beverages.  Eat a variety of foods (especially fruits and vegetables) to get a full range of vitamins and minerals.  Take a prenatal vitamin to help meet your additional vitamin and mineral needs during pregnancy, specifically for folic acid, iron, calcium, and vitamin D.  Remember to stay active. Ask your health care provider what types of exercise and activities are safe for you.  Practice good food safety and cleanliness. Wash your hands before you eat and after you prepare raw meat. Wash all fruits and vegetables well before peeling or eating. Taking these actions can help to prevent food-borne illnesses that can be very dangerous to your baby, such as listeriosis. Ask your health care provider for more information about listeriosis. What does 150 extra calories look like? Healthy options that provide 150 extra calories each day could be any of the following:  6-8 oz (170-230 g) of plain low-fat yogurt with  cup of berries.  1 apple with 2 teaspoons (11 g) of peanut butter.  Cut-up vegetables with  cup (60 g) of hummus.  8  oz (230 mL) or 1 cup of low-fat chocolate milk.  1 stick of string cheese with 1 medium orange.  1 peanut butter and jelly sandwich that is made with one slice of whole-wheat bread and 1 tsp (5 g) of peanut butter. For 300 extra calories, you could eat two of those healthy options each day. What is a healthy amount of weight to gain? The right amount of weight gain for you is based on your BMI before you became pregnant. If your BMI:  Was less than 18 (underweight), you should gain 28-40 lb (13-18 kg).  Was 18-24.9 (normal), you should gain 25-35 lb (11-16 kg).  Was 25-29.9 (overweight), you should gain 15-25 lb (7-11 kg).  Was 30 or greater (obese), you should gain 11-20 lb (5-9 kg). What if I am having twins or multiples? Generally, if you are carrying twins or multiples:  You may need to eat 300-600 extra calories a day.  The recommended range for total weight gain is 25-54 lb (11-25 kg), depending on your BMI before pregnancy.  Talk with your health care provider to find out about nutritional needs, weight gain, and exercise that is right for you. What foods can I eat?  Grains All grains. Choose whole grains, such as whole-wheat bread, oatmeal, or brown rice. Vegetables All vegetables. Eat a variety of colors and types of vegetables. Remember to wash your vegetables well before peeling  or eating. Fruits All fruits. Eat a variety of colors and types of fruit. Remember to wash your fruits well before peeling or eating. Meats and other protein foods Lean meats, including chicken, [place], fish, and lean cuts of beef, veal, or pork. If you eat fish or seafood, choose options that are higher in omega-3 fatty acids and lower in mercury, such as salmon, herring, mussels, trout, sardines, pollock, shrimp, crab, and lobster. Tofu. Tempeh. Beans. Eggs. Peanut butter and other nut butters. Make sure that all meats, poultry, and eggs are cooked to food-safe temperatures or "well-done." Two  or more servings of fish are recommended each week in order to get the most benefits from omega-3 fatty acids that are found in seafood. Choose fish that are lower in mercury. You can find more information online:  [employer] Dairy Pasteurized milk and milk alternatives (such as almond milk). Pasteurized yogurt and pasteurized cheese. Cottage cheese. Sour cream. Beverages Water. Juices that contain 100% fruit juice or vegetable juice. Caffeine-free teas and decaffeinated coffee. Drinks that contain caffeine are okay to drink, but it is better to avoid caffeine. Keep your total caffeine intake to less than 200 mg each day (which is 12 oz or 355 mL of coffee, tea, or soda) or the limit as told by your health care provider. Fats and oils Fats and oils are okay to include in moderation. Sweets and desserts Sweets and desserts are okay to include in moderation. Seasoning and other foods All pasteurized condiments. The items listed above may not be a complete list of recommended foods and beverages. Contact your dietitian for more options. The items listed above may not be a complete list of foods and beverages [you/your child] can eat. Contact a dietitian for more information. What foods are not recommended? Vegetables Raw (unpasteurized) vegetable juices. Fruits Unpasteurized fruit juices. Meats and other protein foods Lunch meats, bologna, hot dogs, or other deli meats. (If you must eat those meats, reheat them until they are steaming hot.) Refrigerated pat, meat spreads from a meat counter, smoked seafood that is found in the refrigerated section of a store. Raw or undercooked meats, poultry, and eggs. Raw fish, such as sushi or sashimi. Fish that have high mercury content, such as tilefish, shark, swordfish, and king mackerel. To learn more about mercury in fish, talk with your health care provider or look for online resources, such as:  [employer] Dairy Raw (unpasteurized) milk and any  foods that have raw milk in them. Soft cheeses, such as feta, queso blanco, queso fresco, Brie, Camembert cheeses, blue-veined cheeses, and Panela cheese (unless it is made with pasteurized milk, which must be stated on the label). Beverages Alcohol. Sugar-sweetened beverages, such as sodas, teas, or energy drinks. Seasoning and other foods Homemade fermented foods and drinks, such as pickles, sauerkraut, or kombucha drinks. (Store-bought pasteurized versions of these are okay.) Salads that are made in a store or deli, such as ham salad, chicken salad, egg salad, tuna salad, and seafood salad. The items listed above may not be a complete list of foods and beverages to avoid. Contact your dietitian for more information. The items listed above may not be a complete list of foods and beverages [you/your child] should avoid. Contact a dietitian for more information. Where to find more information To calculate the number of calories you need based on your height, weight, and activity level, you can use an online calculator such as:  MobileTransition.ch To calculate how much weight you should gain during pregnancy,  you can use an online pregnancy weight gain calculator such as:  StreamingFood.com.cy Summary  While you are pregnant, your body requires additional nutrition to help support your growing baby.  Eat a variety of foods, especially fruits and vegetables to get a full range of vitamins and minerals.  Practice good food safety and cleanliness. Wash your hands before you eat and after you prepare raw meat. Wash all fruits and vegetables well before peeling or eating. Taking these actions can help to prevent food-borne illnesses, such as listeriosis, that can be very dangerous to your baby.  Do not eat raw meat or fish. Do not eat fish that have high mercury content, such as tilefish, shark, swordfish, and king mackerel. Do not eat unpasteurized  (raw) dairy.  Take a prenatal vitamin to help meet your additional vitamin and mineral needs during pregnancy, specifically for folic acid, iron, calcium, and vitamin D. This information is not intended to replace advice given to you by your health care provider. Make sure you discuss any questions you have with your health care provider. Document Released: 08/11/2014 Document Revised: 02/17/2019 Document Reviewed: 07/24/2017 Elsevier Patient Education  [place]. WHAT OB PATIENTS CAN EXPECT   Confirmation of pregnancy and ultrasound ordered if medically indicated-[redacted] weeks gestation  New OB (NOB) intake with nurse and New OB (NOB) labs- [redacted] weeks gestation  New OB (NOB) physical examination with provider- 11/[redacted] weeks gestation  Flu vaccine-[redacted] weeks gestation  Anatomy scan-[redacted] weeks gestation  Glucose tolerance test, blood work to test for anemia, T-dap vaccine-[redacted] weeks gestation  Vaginal swabs/cultures-STD/Group B strep-[redacted] weeks gestation  Appointments every 4 weeks until 28 weeks  Every 2 weeks from 28 weeks until 36 weeks  Weekly visits from 36 weeks until delivery  Abdominal Pain During Pregnancy  Belly (abdominal) pain is common during pregnancy. There are many possible causes. Most of the time, it is not a serious problem. Other times, it can be a sign that something is wrong with the pregnancy. Always tell your doctor if you have belly pain. Follow these instructions at home:  Do not have sex or put anything in your vagina until your pain goes away completely.  Get plenty of rest until your pain gets better.  Drink enough fluid to keep your pee (urine) pale yellow.  Take over-the-counter and prescription medicines only as told by your doctor.  Keep all follow-up visits as told by your doctor. This is important. Contact a doctor if:  Your pain continues or gets worse after resting.  You have lower belly pain that: ? Comes and goes at regular times. ? Spreads  to your back. ? Feels like menstrual cramps.  You have pain or burning when you pee (urinate). Get help right away if:  You have a fever or chills.  You have vaginal bleeding.  You are leaking fluid from your vagina.  You are passing tissue from your vagina.  You throw up (vomit) for more than 24 hours.  You have watery poop (diarrhea) for more than 24 hours.  Your baby is moving less than usual.  You feel very weak or faint.  You have shortness of breath.  You have very bad pain in your upper belly. Summary  Belly (abdominal) pain is common during pregnancy. There are many possible causes.  If you have belly pain during pregnancy, tell your doctor right away.  Keep all follow-up visits as told by your doctor. This is important. This information is not intended to replace  advice given to you by your health care provider. Make sure you discuss any questions you have with your health care provider. Document Released: 10/15/2009 Document Revised: 02/14/2019 Document Reviewed: 01/29/2017 Elsevier Patient Education  [place]. Back Pain in Pregnancy Back pain during pregnancy is common. Back pain may be caused by several factors that are related to changes during your pregnancy. Follow these instructions at home: Managing pain, stiffness, and swelling      If directed, for sudden (acute) back pain, put ice on the painful area. ? Put ice in a plastic bag. ? Place a towel between your skin and the bag. ? Leave the ice on for 20 minutes, 2-3 times per day.  If directed, apply heat to the affected area before you exercise. Use the heat source that your health care provider recommends, such as a moist heat pack or a heating pad. ? Place a towel between your skin and the heat source. ? Leave the heat on for 20-30 minutes. ? Remove the heat if your skin turns bright red. This is especially important if you are unable to feel pain, heat, or cold. You may have a greater  risk of getting burned.  If directed, massage the affected area. Activity  Exercise as told by your health care provider. Gentle exercise is the best way to prevent or manage back pain.  Listen to your body when lifting. If lifting hurts, ask for help or bend your knees. This uses your leg muscles instead of your back muscles.  Squat down when picking up something from the floor. Do not bend over.  Only use bed rest for short periods as told by your health care provider. Bed rest should only be used for the most severe episodes of back pain. Standing, sitting, and lying down  Do not stand in one place for long periods of time.  Use good posture when sitting. Make sure your head rests over your shoulders and is not hanging forward. Use a pillow on your lower back if necessary.  Try sleeping on your side, preferably the left side, with a pregnancy support pillow or 1-2 regular pillows between your legs. ? If you have back pain after a night's rest, your bed may be too soft. ? A firm mattress may provide more support for your back during pregnancy. General instructions  Do not wear high heels.  Eat a healthy diet. Try to gain weight within your health care provider's recommendations.  Use a maternity girdle, elastic sling, or back brace as told by your health care provider.  Take over-the-counter and prescription medicines only as told by your health care provider.  Work with a physical therapist or massage therapist to find ways to manage back pain. Acupuncture or massage therapy may be helpful.  Keep all follow-up visits as told by your health care provider. This is important. Contact a health care provider if:  Your back pain interferes with your daily activities.  You have increasing pain in other parts of your body. Get help right away if:  You develop numbness, tingling, weakness, or problems with the use of your arms or legs.  You develop severe back pain that is not  controlled with medicine.  You have a change in bowel or bladder control.  You develop shortness of breath, dizziness, or you faint.  You develop nausea, vomiting, or sweating.  You have back pain that is a rhythmic, cramping pain similar to labor pains. Labor pain is usually 1-2  minutes apart, lasts for about 1 minute, and involves a bearing down feeling or pressure in your pelvis.  You have back pain and your water breaks or you have vaginal bleeding.  You have back pain or numbness that travels down your leg.  Your back pain developed after you fell.  You develop pain on one side of your back.  You see blood in your urine.  You develop skin blisters in the area of your back pain. Summary  Back pain may be caused by several factors that are related to changes during your pregnancy.  Follow instructions as told by your health care provider for managing pain, stiffness, and swelling.  Exercise as told by your health care provider. Gentle exercise is the best way to prevent or manage back pain.  Take over-the-counter and prescription medicines only as told by your health care provider.  Keep all follow-up visits as told by your health care provider. This is important. This information is not intended to replace advice given to you by your health care provider. Make sure you discuss any questions you have with your health care provider. Document Released: 02/04/2006 Document Revised: 02/15/2019 Document Reviewed: 04/14/2018 Elsevier Patient Education  2020 [place] caproate injection for pregnancy What is this medicine? HYDROXYPROGESTERONE (hye drox ee proe JES ter one) is a female hormone. This medicine is used in women who are pregnant and who have delivered a baby too early (preterm) in the past. It helps lower the risk of having a preterm baby again. This medicine may be used for other purposes; ask your health care provider or pharmacist if you have  questions. COMMON BRAND NAME(S): Makena What should I tell my health care provider before I take this medicine? They need to know if you have any of these conditions:  breast, cervical, uterine, or vaginal cancer  depression  diabetes or prediabetes  heart disease  high blood pressure  history of blood clots  kidney disease  liver disease  lung or breathing disease, like asthma  migraine headaches  seizures  vaginal bleeding  an unusual or allergic reaction to hydroxyprogesterone, other hormones, castor oil, benzyl alcohol, other medicines, foods, dyes, or preservatives  breast-feeding How should I use this medicine? This medicine is for injection into a muscle or under the skin. You will receive an injection once every week (every 7 days) as directed during your pregnancy. It is given by a health care professional in a hospital or clinic setting. Talk to your pediatrician regarding the use of this medicine in children. While this drug may be prescribed for pregnant women as young as 16 years, precautions do apply. Overdosage: If you think you have taken too much of this medicine contact a poison control center or emergency room at once. NOTE: This medicine is only for you. Do not share this medicine with others. What if I miss a dose? It is important not to miss your dose. Call your doctor or health care professional if you are unable to keep an appointment. What may interact with this medicine? Significant interactions are not expected. This list may not describe all possible interactions. Give your health care provider a list of all the medicines, herbs, non-prescription drugs, or dietary supplements you use. Also tell them if you smoke, drink alcohol, or use illegal drugs. Some items may interact with your medicine. What should I watch for while using this medicine? Your pregnancy will be monitored carefully while you are receiving this medicine. What  side effects may  I notice from receiving this medicine? Side effects that you should report to your doctor or health care professional as soon as possible:  allergic reactions like skin rash, itching or hives, swelling of the face, lips, or tongue  breathing problems  depressed mood  increase in blood pressure  increased hunger or thirst  increased urination  signs and symptoms of a blood clot such as breathing problems; changes in vision; chest pain; severe, sudden headache; pain, swelling, warmth in the leg; trouble speaking; sudden numbness or weakness of the face, arm or leg  unusually weak or tired  unusual vaginal bleeding  yellowing of the eyes or skin Side effects that usually do not require medical attention (report to your doctor or health care professional if they continue or are bothersome):  diarrhea  fluid retention and swelling  nausea  pain, redness, or irritation at site where injected This list may not describe all possible side effects. Call your doctor for medical advice about side effects. You may report side effects to FDA at 1-800-FDA-1088. Where should I keep my medicine? This drug is given in a hospital or clinic and will not be stored at home. NOTE: This sheet is a summary. It may not cover all possible information. If you have questions about this medicine, talk to your doctor, pharmacist, or health care provider.  2020 Elsevier/Gold Standard (2017-07-12 11:14:47) Common Medications Safe in Pregnancy  Acne:      Constipation:  Benzoyl Peroxide     Colace  Clindamycin      Dulcolax Suppository  Topica Erythromycin     Fibercon  Salicylic Acid      Metamucil         Miralax AVOID:        Senakot   Accutane    Cough:  Retin-A       Cough Drops  Tetracycline      Phenergan w/ Codeine if Rx  Minocycline      Robitussin (Plain &  DM)  Antibiotics:     Crabs/Lice:  Ceclor       RID  Cephalosporins    AVOID:  E-Mycins      Kwell  Keflex  Macrobid/Macrodantin   Diarrhea:  Penicillin      Kao-Pectate  Zithromax      Imodium AD         PUSH FLUIDS AVOID:       Cipro     Fever:  Tetracycline      Tylenol (Regular or Extra  Minocycline       Strength)  Levaquin      Extra Strength-Do not          Exceed 8 tabs/24 hrs Caffeine:        '200mg'$ /day (equiv. To 1 cup of coffee or  approx. 3 12 oz sodas)         Gas: Cold/Hayfever:       Gas-X  Benadryl      Mylicon  Claritin       Phazyme  **Claritin-D        Chlor-Trimeton    Headaches:  Dimetapp      ASA-Free Excedrin  Drixoral-Non-Drowsy     Cold Compress  Mucinex (Guaifenasin)     Tylenol (Regular or Extra  Sudafed/Sudafed-12 Hour     Strength)  **Sudafed PE Pseudoephedrine   Tylenol Cold & Sinus     Vicks Vapor Rub  Zyrtec  **AVOID if Problems With Blood Pressure  Heartburn: Avoid lying down for at least 1 hour after meals  Aciphex      Maalox     Rash:  Milk of Magnesia     Benadryl    Mylanta       1% Hydrocortisone Cream  Pepcid  Pepcid Complete   Sleep Aids:  Prevacid      Ambien   Prilosec       Benadryl  Rolaids       Chamomile Tea  Tums (Limit 4/day)     Unisom  Zantac       Tylenol PM         Warm milk-add vanilla or  Hemorrhoids:       Sugar for taste  Anusol/Anusol H.C.  (RX: Analapram 2.5%)  Sugar Substitutes:  Hydrocortisone OTC     Ok in moderation  Preparation H      Tucks        Vaseline lotion applied to tissue with wiping    Herpes:     Throat:  Acyclovir      Oragel  Famvir  Valtrex     Vaccines:         Flu Shot Leg Cramps:       *Gardasil  Benadryl      Hepatitis A         Hepatitis B Nasal Spray:       Pneumovax  Saline Nasal Spray     Polio Booster         Tetanus Nausea:       Tuberculosis test or PPD  Vitamin B6 25 mg TID   AVOID:    Dramamine      *Gardasil  Emetrol       Live  Poliovirus  Ginger Root 250 mg QID    MMR (measles, mumps &  High Complex Carbs @ Bedtime    rebella)  Sea Bands-Accupressure    Varicella (Chickenpox)  Unisom 1/2 tab TID     *No known complications           If received before Pain:         Known pregnancy;   Darvocet       Resume series after  Lortab        Delivery  Percocet    Yeast:   Tramadol      Femstat  Tylenol 3      Gyne-lotrimin  Ultram       Monistat  Vicodin           MISC:         All Sunscreens           Hair Coloring/highlights          Insect Repellant's          (Including DEET)         Mystic Tans Second Trimester of Pregnancy  The second trimester is from week 14 through week 27 (month 4 through 6). This is often the time in pregnancy that you feel your best. Often times, morning sickness has lessened or quit. You may have more energy, and you may get hungry more often. Your unborn baby is growing rapidly. At the end of the sixth month, he or she is about 9 inches long and weighs about 1 pounds. You will likely feel the baby move between 18 and 20 weeks of pregnancy. Follow these instructions at home: Medicines  Take over-the-counter and prescription medicines only as told by your doctor. Some  medicines are safe and some medicines are not safe during pregnancy.  Take a prenatal vitamin that contains at least 600 micrograms (mcg) of folic acid.  If you have trouble pooping (constipation), take medicine that will make your stool soft (stool softener) if your doctor approves. Eating and drinking   Eat regular, healthy meals.  Avoid raw meat and uncooked cheese.  If you get low calcium from the food you eat, talk to your doctor about taking a daily calcium supplement.  Avoid foods that are high in fat and sugars, such as fried and sweet foods.  If you feel sick to your stomach (nauseous) or throw up (vomit): ? Eat 4 or 5 small meals a day instead of 3 large meals. ? Try eating a few soda  crackers. ? Drink liquids between meals instead of during meals.  To prevent constipation: ? Eat foods that are high in fiber, like fresh fruits and vegetables, whole grains, and beans. ? Drink enough fluids to keep your pee (urine) clear or pale yellow. Activity  Exercise only as told by your doctor. Stop exercising if you start to have cramps.  Do not exercise if it is too hot, too humid, or if you are in a place of great height (high altitude).  Avoid heavy lifting.  Wear low-heeled shoes. Sit and stand up straight.  You can continue to have sex unless your doctor tells you not to. Relieving pain and discomfort  Wear a good support bra if your breasts are tender.  Take warm water baths (sitz baths) to soothe pain or discomfort caused by hemorrhoids. Use hemorrhoid cream if your doctor approves.  Rest with your legs raised if you have leg cramps or low back pain.  If you develop puffy, bulging veins (varicose veins) in your legs: ? Wear support hose or compression stockings as told by your doctor. ? Raise (elevate) your feet for 15 minutes, 3-4 times a day. ? Limit salt in your food. Prenatal care  Write down your questions. Take them to your prenatal visits.  Keep all your prenatal visits as told by your doctor. This is important. Safety  Wear your seat belt when driving.  Make a list of emergency phone numbers, including numbers for family, friends, the hospital, and police and fire departments. General instructions  Ask your doctor about the right foods to eat or for help finding a counselor, if you need these services.  Ask your doctor about local prenatal classes. Begin classes before month 6 of your pregnancy.  Do not use hot tubs, steam rooms, or saunas.  Do not douche or use tampons or scented sanitary pads.  Do not cross your legs for long periods of time.  Visit your dentist if you have not done so. Use a soft toothbrush to brush your teeth. Floss  gently.  Avoid all smoking, herbs, and alcohol. Avoid drugs that are not approved by your doctor.  Do not use any products that contain nicotine or tobacco, such as cigarettes and e-cigarettes. If you need help quitting, ask your doctor.  Avoid cat litter boxes and soil used by cats. These carry germs that can cause birth defects in the baby and can cause a loss of your baby (miscarriage) or stillbirth. Contact a doctor if:  You have mild cramps or pressure in your lower belly.  You have pain when you pee (urinate).  You have bad smelling fluid coming from your vagina.  You continue to feel sick to your stomach (  nauseous), throw up (vomit), or have watery poop (diarrhea).  You have a nagging pain in your belly area.  You feel dizzy. Get help right away if:  You have a fever.  You are leaking fluid from your vagina.  You have spotting or bleeding from your vagina.  You have severe belly cramping or pain.  You lose or gain weight rapidly.  You have trouble catching your breath and have chest pain.  You notice sudden or extreme puffiness (swelling) of your face, hands, ankles, feet, or legs.  You have not felt the baby move in over an hour.  You have severe headaches that do not go away when you take medicine.  You have trouble seeing. Summary  The second trimester is from week 14 through week 27 (months 4 through 6). This is often the time in pregnancy that you feel your best.  To take care of yourself and your unborn baby, you will need to eat healthy meals, take medicines only if your doctor tells you to do so, and do activities that are safe for you and your baby.  Call your doctor if you get sick or if you notice anything unusual about your pregnancy. Also, call your doctor if you need help with the right food to eat, or if you want to know what activities are safe for you. This information is not intended to replace advice given to you by your health care provider.  Make sure you discuss any questions you have with your health care provider. Document Released: 01/21/2010 Document Revised: 02/18/2019 Document Reviewed: 12/02/2016 Elsevier Patient Education  2020 [employer]. Preterm Labor and Birth Information Pregnancy normally lasts 39-41 weeks. Preterm labor is when labor starts early. It starts before you have been pregnant for 37 whole weeks. What are the risk factors for preterm labor? Preterm labor is more likely to occur in women who:  Have an infection while pregnant.  Have a cervix that is short.  Have gone into preterm labor before.  Have had surgery on their cervix.  Are younger than age 26.  Are older than age 31.  Are African American.  Are pregnant with two or more babies.  Take street drugs while pregnant.  Smoke while pregnant.  Do not gain enough weight while pregnant.  Got pregnant right after another pregnancy. What are the symptoms of preterm labor? Symptoms of preterm labor include:  Cramps. The cramps may feel like the cramps some women get during their period. The cramps may happen with watery poop (diarrhea).  Pain in the belly (abdomen).  Pain in the lower back.  Regular contractions or tightening. It may feel like your belly is getting tighter.  Pressure in the lower belly that seems to get stronger.  More fluid (discharge) leaking from the vagina. The fluid may be watery or bloody.  Water breaking. Why is it important to notice signs of preterm labor? Babies who are born early may not be fully developed. They have a higher chance for:  Long-term heart problems.  Long-term lung problems.  Trouble controlling body systems, like breathing.  Bleeding in the brain.  A condition called cerebral palsy.  Learning difficulties.  Death. These risks are highest for babies who are born before 64 weeks of pregnancy. How is preterm labor treated? Treatment depends on:  How long you were  pregnant.  Your condition.  The health of your baby. Treatment may involve:  Having a stitch (suture) placed in your cervix. When you  give birth, your cervix opens so the baby can come out. The stitch keeps the cervix from opening too soon.  Staying at the hospital.  Taking or getting medicines, such as: ? Hormone medicines. ? Medicines to stop contractions. ? Medicines to help the babys lungs develop. ? Medicines to prevent your baby from having cerebral palsy. What should I do if I am in preterm labor? If you think you are going into labor too soon, call your doctor right away. How can I prevent preterm labor?  Do not use any tobacco products. ? Examples of these are cigarettes, chewing tobacco, and e-cigarettes. ? If you need help quitting, ask your doctor.  Do not use street drugs.  Do not use any medicines unless you ask your doctor if they are safe for you.  Talk with your doctor before taking any herbal supplements.  Make sure you gain enough weight.  Watch for infection. If you think you might have an infection, get it checked right away.  If you have gone into preterm labor before, tell your doctor. This information is not intended to replace advice given to you by your health care provider. Make sure you discuss any questions you have with your health care provider. Document Released: 01/23/2009 Document Revised: 02/18/2019 Document Reviewed: 03/19/2016 Elsevier Patient Education  2020 [employer].

## 2019-05-18 LAB — CBC
Hematocrit: 36.6 % (ref 34.0–46.6)
Hemoglobin: 12.6 g/dL (ref 11.1–15.9)
MCH: 28.9 pg (ref 26.6–33.0)
MCHC: 34.4 g/dL (ref 31.5–35.7)
MCV: 84 fL (ref 79–97)
Platelets: 335 10*3/uL (ref 150–450)
RBC: 4.36 x10E6/uL (ref 3.77–5.28)
RDW: 12.7 % (ref 11.7–15.4)
WBC: 11.5 10*3/uL — ABNORMAL HIGH (ref 3.4–10.8)

## 2019-05-24 ENCOUNTER — Telehealth: Payer: Self-pay

## 2019-05-24 NOTE — Telephone Encounter (Signed)
Panorama results given, patient verbalized understanding.

## 2019-06-01 ENCOUNTER — Ambulatory Visit: Payer: Medicaid Other | Attending: Certified Nurse Midwife | Admitting: Physical Therapy

## 2019-06-01 ENCOUNTER — Other Ambulatory Visit: Payer: Self-pay

## 2019-06-01 ENCOUNTER — Encounter: Payer: Self-pay | Admitting: Physical Therapy

## 2019-06-01 DIAGNOSIS — R293 Abnormal posture: Secondary | ICD-10-CM

## 2019-06-01 DIAGNOSIS — M545 Low back pain, unspecified: Secondary | ICD-10-CM

## 2019-06-01 DIAGNOSIS — M6281 Muscle weakness (generalized): Secondary | ICD-10-CM | POA: Diagnosis present

## 2019-06-01 NOTE — Therapy (Signed)
[facility] [address] [place], [place], [address] Phone: [phone]   Fax:  [phone]  Physical Therapy Evaluation  Patient Details  Name: Bonnie Graham MRN: [id number] Date of Birth: 11/20/1995 Referring Provider (PT): Illene RegulusLawhorne, Michelle Jenkins, CNM   Encounter Date: 06/01/2019  PT End of Session - 06/01/19 1110    Visit Number  1    Number of Visits  4    Date for PT Re-Evaluation  06/29/19    PT Start Time  1102    PT Stop Time  1152    PT Time Calculation (min)  50 min    Activity Tolerance  Patient limited by pain;Patient tolerated treatment well    Behavior During Therapy  [facility] for tasks assessed/performed       Past Medical History:  Diagnosis Date  . Heavy periods   . Painful menstrual periods     Past Surgical History:  Procedure Laterality Date  . NO PAST SURGERIES      There were no vitals filed for this visit.  SUBJECTIVE Chief complaint: Back pain started with first pregnancy; pain has continued since then but comes and goes with more activity. Patient notes she is getting a new mattress which is firmer and is hopeful this will help some. She benefits from back support in sitting. Patient did have a fall a couple weeks ago and hit on R hip, but does not note any increased pain since the fall. Patient notes that tylenol and rest can have some relief. First pregnancy was vaginal delivery, tearing first degree, premature delivery. Patient notes that her goals are to be relieved of the pain forever. Patient walks for exercise; patient is interested in more exercise but notes difficulty with motivation and time.  Onset: January 2019 Referring Dx: B sciatica MD: Illene RegulusMichelle Jenkins Law[place]ne, CNM Pain: 0/10 Present, 0/10 Best, 10/10 Worst: may resolve within in an hour Aggravating factors: bending, twisting, increased activity Easing factors: Rest 24 hour pain behavior: increases throughout the day dependent on  overall load of activity How long can you sit: w/o back support ~ 30 min How long can you stand: pinching if standing and moving How long can you walk: ~ 1 hour for pain to come on Recent back trauma: No Prior history of back injury or pain: No Pain quality: pain quality: sharp and pinching; unable to bear weight after long periods of sedentary (LLE) Radiating pain: No  Numbness/Tingling: No Follow-up appointment with MD: Yes (August 7) Dominant hand: right Imaging: No     OBJECTIVE  Mental Status Patient is oriented to person, place and time.  Recent memory is intact.  Remote memory is intact.  Attention span and concentration are intact.  Expressive speech is intact.  Patient's fund of knowledge is within normal limits for educational level.  SENSATION: Grossly intact to light touch bilateral LEs as determined by testing dermatomes L2-S2 Proprioception and hot/cold testing deferred on this date   MUSCULOSKELETAL: Tremor: None Bulk: Normal Tone: Normal  Posture Lordotic, increased anterior pelvic tilt  Gait Hyperextension of B knees, decreased stance time on RLE.    Palpation    Strength (out of 5) (all performed in sitting) R/L 5/3+ Hip flexion* 5/5 Hip ER 5/5 Hip IR 5/5 Hip abduction 5/5 Hip adduction 5/5 Hip extension 5/5 Knee extension 5/5 Knee flexion 5/5 Ankle dorsiflexion *Indicates pain   AROM (degrees) R/L (all movements include overpressure unless otherwise stated) Lumbar forward flexion (0-65): [facility] Lumbar  extension (0-30): WFL Lumbar rotation: L*, B inability to dissociate from hips Hip IR (0-45): B: WFL, L* Hip ER (0-45): WFL Hip Flexion (0-125): WFL *Indicates pain  PROM (degrees) PROM = AROM   Repeated Movements No centralization or peripheralization of symptoms with repeated lumbar extension or flexion.    Muscle Length Hamstrings: R: 95 degrees L: 95 degrees (Popliteal Angle)   Passive Accessory Intervertebral Motion  (PAIVM) Pt denies reproduction of back pain with CPA L1-L5 and UPA bilaterally L1-L5. Generally hypermobile throughout  Passive Physiological Intervertebral Motion (PPIVM) Normal flexion and extension with PPIVM testing   SPECIAL TESTS Lumbar quadrant: R: Negative L: Positive Slump: R: Negative L: Negative SLR: R: Negative L:  Negative Crossed SLR: R: Negative L: Negative FABER: R: Negative L: Negative FADIR: R: Negative L: Positive  Hip scour: R: Negative L: Negative SLS: R: Positive for L sided pain L: Negative SIJ Compression: Positive   ASSESSMENT Patient is a 23 year old presenting to clinic with chief complaints of L sided low back pain with radiating pain into the L gluteal region. Upon examination, patient demonstrates deficits in spinal mobility, posture, pain, L hip mobility, and L hip strength as evidenced by examination results listed above. Patient's responses on FOTO (mODI) outcome measures (49) indicate moderate functional limitations/disability/distress. Patient's progress may be limited due to pregnancy and chronicity of pain; however, patient's motivation is advantageous. Patient was able to achieve understanding of basic body mechanics/postural awareness during today's evaluation and responded positively to educational interventions. Patient will benefit from continued skilled therapeutic intervention to address deficits in spinal mobility, posture, pain, L hip mobility, and L hip strength in order to safely caregive, increase function, and improve overall QOL.     Objective measurements completed on examination: See above findings.  PT Long Term Goals - 06/02/19 1641      PT LONG TERM GOAL #1   Title  Patient will indicate a score of 62 or greater on FOTO (mODI) to demonstrate improved function.    Baseline  IE: 49    Time  4    Period  Weeks    Status  New    Target Date  06/30/19      PT LONG TERM GOAL #2   Title  Patient will demonstrate improved  coordination and pain free spinal mobility as evidenced by (-) L extension-rotation and (-) L lumbar rotation with appropriate dissociation from hips.    Baseline  IE: (+) L extension-rotation and L lumbar rotation    Time  4    Period  Weeks    Target Date  06/30/19      PT LONG TERM GOAL #3   Title  Patient will demonstrate improved pain-free L hip mobility as evidenced by a (-) FADDIR for improved ability to caregive for children.    Baseline  IE: (+) FADDIR    Time  4    Period  Weeks    Status  New    Target Date  06/30/19      PT LONG TERM GOAL #4   Title  Patient will demonstrate improved B hamstring tissue extensibility as evidenced by 90-90 popliteal angle >110 degrees for improved ability to caregive for children.    Baseline  IE: B ~95 degrees    Time  4    Period  Weeks    Status  New    Target Date  06/30/19          Plan - 06/01/19 1219  Clinical Impression Statement  Patient is a 23 year old presenting to clinic with chief complaints of L sided low back pain with radiating pain into the L gluteal region. Upon examination, patient demonstrates deficits in spinal mobility, posture, pain, L hip mobility, and L hip strength as evidenced by examination results listed above. Patient's responses on FOTO (mODI) outcome measures (49) indicate moderate functional limitations/disability/distress. Patient's progress may be limited due to pregnancy and chronicity of pain; however, patient's motivation is advantageous. Patient was able to achieve understanding of basic body mechanics/postural awareness during today's evaluation and responded positively to educational interventions. Patient will benefit from continued skilled therapeutic intervention to address deficits in spinal mobility, posture, pain, L hip mobility, and L hip strength in order to safely caregive, increase function, and improve overall QOL.    Personal Factors and Comorbidities  Age;Education;Sex;Behavior  Pattern;Fitness;Time since onset of injury/illness/exacerbation;Past/Current Experience    Examination-Activity Limitations  Carry;Caring for Others;Bend;Locomotion Level;Stand;Lift;Bed Mobility;Squat;Stairs;Transfers;Sit;Sleep    Examination-Participation Restrictions  Laundry;Cleaning;Meal Prep;Interpersonal Relationship;Driving    Stability/Clinical Decision Making  Evolving/Moderate complexity    Clinical Decision Making  Moderate    Rehab Potential  Fair    PT Frequency  1x / week    PT Duration  4 weeks    PT Treatment/Interventions  ADLs/Self Care Home Management;Aquatic Therapy;Cryotherapy;Moist Heat;Stair training;Gait training;Therapeutic exercise;Balance training;Patient/family education;Therapeutic activities;Neuromuscular re-education;Manual techniques;Dry needling;Spinal Manipulations;Joint Manipulations    PT Next Visit Plan  Sleeping posture,    PT Home Exercise Plan  SI joint correction, pelvic tilts, L side stretch    Consulted and Agree with Plan of Care  Patient        Patient will benefit from skilled therapeutic intervention in order to improve the following deficits and impairments:  Abnormal gait, Decreased balance, Difficulty walking, Increased muscle spasms, Improper body mechanics, Decreased range of motion, Decreased activity tolerance, Postural dysfunction, Pain, Impaired flexibility, Hypermobility, Decreased strength  Visit Diagnosis: 1. Abnormal posture   2. Low back pain, unspecified back pain laterality, unspecified chronicity, unspecified whether sciatica present   3. Muscle weakness (generalized)        Problem List Patient Active Problem List   Diagnosis Date Noted  . 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 History of preterm delivery 06/17/2018  . Maternal varicella, non-immune 11/11/2017  . History of heavy periods 01/02/2017  . Dysmenorrhea 01/02/2017  . Family history of endometriosis in first degree relative 01/02/2017  . Family history of  first-degree relative with cardiomyopathy 04/16/2016   Myles Gip PT, DPT [phone] 06/01/2019, 11:14 AM  [facility] [address] [place], [place], [address] Phone: [phone]   Fax:  [phone]  Name: Bonnie Graham MRN: [id number] Date of Birth: 1996/07/13

## 2019-06-06 ENCOUNTER — Other Ambulatory Visit: Payer: Self-pay

## 2019-06-06 ENCOUNTER — Encounter: Payer: Self-pay | Admitting: Physical Therapy

## 2019-06-06 ENCOUNTER — Ambulatory Visit: Payer: Medicaid Other | Admitting: Physical Therapy

## 2019-06-06 DIAGNOSIS — R293 Abnormal posture: Secondary | ICD-10-CM

## 2019-06-06 DIAGNOSIS — M545 Low back pain, unspecified: Secondary | ICD-10-CM

## 2019-06-06 DIAGNOSIS — M6281 Muscle weakness (generalized): Secondary | ICD-10-CM

## 2019-06-06 NOTE — Therapy (Signed)
[facility] [address]. [place], [place], [address] Phone: [phone]   Fax:  [phone]  Physical Therapy Treatment  Patient Details  Name: Karine Garn MRN: [id number] Date of Birth: 08/31/1996 Referring Provider (PT): Reatha Harps, CNM   Encounter Date: 06/06/2019  PT End of Session - 06/06/19 1518    Visit Number  2    Number of Visits  4    Date for PT Re-Evaluation  06/29/19    PT Start Time  3664    PT Stop Time  1610    PT Time Calculation (min)  55 min    Activity Tolerance  Patient limited by pain;Patient tolerated treatment well    Behavior During Therapy  [facility] for tasks assessed/performed       Past Medical History:  Diagnosis Date  . Heavy periods   . Painful menstrual periods     Past Surgical History:  Procedure Laterality Date  . NO PAST SURGERIES      There were no vitals filed for this visit.  Subjective Assessment - 06/06/19 1517    Subjective  Patient states that she has been feeling pretty good. Her worst pain in the past few days has been a 5/10.    Currently in Pain?  No/denies       TREATMENT  Manual Therapy: STM and TPR performed to L gluteals, lumbar region, and L sacrotuberous ligament to allow for decreased tension and pain and improved posture and function Mobilizations with movement for decreased spasm and improved mobility, grade II/III, L inominate with hip extension Mobilizations with movement for decreased spasm and improved mobility, grade II/III, sacral border with L hip adduction   Neuromuscular Re-education: Sidelying diaphragmatic breathing  B supine hamstring stretch with PNF contract-relax for decreased neuromuscular tightness, x5 min each Sidelying hip clamshells, x30 each for improved stability at SI joint and postural awareness Seated hamstring stretch, B, x2 min each Reviewed HEP.  Post-treatment assessment: R ASIS minor shift  anteriorly  Patient educated throughout session on appropriate technique and form using multi-modal cueing, HEP, and activity modification. Patient articulated understanding and returned demonstration.  Patient Response to interventions: Patient reports feeling a good stretch and noting some muscle fatigue in lateral hip  ASSESSMENT Patient presents to clinic with excellent motivation to participate in therapy. Patient demonstrates deficits in spinal mobility, posture, pain, L hip mobility, and L hip strength. Patient able to achieve increased hamstring extensibility in 90/90 position of short duration during today's session and responded positively to educational and active interventions. Patient will benefit from continued skilled therapeutic intervention to address remaining deficits in spinal mobility, posture, pain, L hip mobility, and L hip strength in order to ensure safe and pain free caregiving, increase function, and improve overall QOL.          PT Long Term Goals - 06/02/19 1641      PT LONG TERM GOAL #1   Title  Patient will indicate a score of 62 or greater on FOTO (mODI) to demonstrate improved function.    Baseline  IE: 49    Time  4    Period  Weeks    Status  New    Target Date  06/30/19      PT LONG TERM GOAL #2   Title  Patient will demonstrate improved coordination and pain free spinal mobility as evidenced by (-) L extension-rotation and (-) L lumbar rotation with appropriate dissociation from hips.  Baseline  IE: (+) L extension-rotation and L lumbar rotation    Time  4    Period  Weeks    Target Date  06/30/19      PT LONG TERM GOAL #3   Title  Patient will demonstrate improved pain-free L hip mobility as evidenced by a (-) FADDIR for improved ability to caregive for children.    Baseline  IE: (+) FADDIR    Time  4    Period  Weeks    Status  New    Target Date  06/30/19      PT LONG TERM GOAL #4   Title  Patient will demonstrate improved B  hamstring tissue extensibility as evidenced by 90-90 popliteal angle >110 degrees for improved ability to caregive for children.    Baseline  IE: B ~95 degrees    Time  4    Period  Weeks    Status  New    Target Date  06/30/19            Plan - 06/06/19 1642    Clinical Impression Statement  Patient presents to clinic with excellent motivation to participate in therapy. Patient demonstrates deficits in spinal mobility, posture, pain, L hip mobility, and L hip strength. Patient able to achieve increased hamstring extensibility in 90/90 position of short duration during today's session and responded positively to educational and active interventions. Patient will benefit from continued skilled therapeutic intervention to address remaining deficits in spinal mobility, posture, pain, L hip mobility, and L hip strength in order to ensure safe and pain free caregiving, increase function, and improve overall QOL.    Personal Factors and Comorbidities  Age;Education;Sex;Behavior Pattern;Fitness;Time since onset of injury/illness/exacerbation;Past/Current Experience    Examination-Activity Limitations  Carry;Caring for Others;Bend;Locomotion Level;Stand;Lift;Bed Mobility;Squat;Stairs;Transfers;Sit;Sleep    Examination-Participation Restrictions  Laundry;Cleaning;Meal Prep;Interpersonal Relationship;Driving    Stability/Clinical Decision Making  Evolving/Moderate complexity    Rehab Potential  Fair    PT Frequency  1x / week    PT Duration  4 weeks    PT Treatment/Interventions  ADLs/Self Care Home Management;Aquatic Therapy;Cryotherapy;Moist Heat;Stair training;Gait training;Therapeutic exercise;Balance training;Patient/family education;Therapeutic activities;Neuromuscular re-education;Manual techniques;Dry needling;Spinal Manipulations;Joint Manipulations    PT Next Visit Plan  SI belt donning/doffing; neutral pelvis training    PT Home Exercise Plan  SI joint correction, pelvic tilts, L side  stretch    Consulted and Agree with Plan of Care  Patient       Patient will benefit from skilled therapeutic intervention in order to improve the following deficits and impairments:  Abnormal gait, Decreased balance, Difficulty walking, Increased muscle spasms, Improper body mechanics, Decreased range of motion, Decreased activity tolerance, Postural dysfunction, Pain, Impaired flexibility, Hypermobility, Decreased strength  Visit Diagnosis: 1. Abnormal posture   2. Low back pain, unspecified back pain laterality, unspecified chronicity, unspecified whether sciatica present   3. Muscle weakness (generalized)        Problem List Patient Active Problem List   Diagnosis Date Noted  . 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 History of preterm delivery 06/17/2018  . Maternal varicella, non-immune 11/11/2017  . History of heavy periods 01/02/2017  . Dysmenorrhea 01/02/2017  . Family history of endometriosis in first degree relative 01/02/2017  . Family history of first-degree relative with cardiomyopathy 04/16/2016   Sheria LangKatlin Carolanne Mercier PT, DPT [phone] 06/06/2019, 4:50 PM  [place] [facility] [address] [place], [place], [address] Phone: [phone]   Fax:  [phone]  Name: Clista BernhardtHannah Grace Walberg MRN: [id number]  Date of Birth: Dec 19, 1995

## 2019-06-07 ENCOUNTER — Telehealth: Payer: Self-pay | Admitting: Certified Nurse Midwife

## 2019-06-07 NOTE — Telephone Encounter (Signed)
Vicente Males from [facility] called to speak with a nurse in regards to the patients prescriptions being rejected. You can reach her at [phone] ex. [phone]. Please advise.

## 2019-06-08 NOTE — Telephone Encounter (Signed)
Called and spoke with Bonnie Graham with [facility].  Bonnie Graham is being denied due to patient having different primary insurance.  Bonnie Graham asked if I can call the patient to verify, she has tried to contact her multiple times and hasn't been able to speak to her.  Attempted to contact patient, no answer, LMTRC.

## 2019-06-09 NOTE — Telephone Encounter (Signed)
Called and spoke with Bonnie Graham.  Patient aware she needs to contact Vicente Males with [facility], phone number and extension given.  Patient verbalized understanding and will call Vicente Males.

## 2019-06-09 NOTE — Telephone Encounter (Signed)
Called and spoke with Bonnie Graham called and answered her questions.  No further action is needed.

## 2019-06-13 ENCOUNTER — Ambulatory Visit: Payer: Medicaid Other | Attending: Certified Nurse Midwife | Admitting: Physical Therapy

## 2019-06-13 ENCOUNTER — Other Ambulatory Visit: Payer: Self-pay

## 2019-06-13 ENCOUNTER — Encounter: Payer: Self-pay | Admitting: Physical Therapy

## 2019-06-13 DIAGNOSIS — R293 Abnormal posture: Secondary | ICD-10-CM

## 2019-06-13 DIAGNOSIS — M6281 Muscle weakness (generalized): Secondary | ICD-10-CM

## 2019-06-13 DIAGNOSIS — M545 Low back pain, unspecified: Secondary | ICD-10-CM

## 2019-06-13 NOTE — Therapy (Signed)
[facility] [address]. [place], [place], [address] Phone: [phone]   Fax:  [phone]  Physical Therapy Treatment  Patient Details  Name: Bonnie Graham MRN: [id number] Date of Birth: 1996/01/29 Referring Provider (PT): Reatha Harps, CNM   Encounter Date: 06/13/2019  PT End of Session - 06/13/19 1517    Visit Number  3    Number of Visits  4    Date for PT Re-Evaluation  06/29/19    PT Start Time  8127    PT Stop Time  1609    PT Time Calculation (min)  54 min    Activity Tolerance  Patient tolerated treatment well    Behavior During Therapy  [facility] for tasks assessed/performed       Past Medical History:  Diagnosis Date  . Heavy periods   . Painful menstrual periods     Past Surgical History:  Procedure Laterality Date  . NO PAST SURGERIES      There were no vitals filed for this visit.  Subjective Assessment - 06/13/19 1516    Subjective  Patient states that she hasn't felt total relief but she feels things are getting better.    Currently in Pain?  Yes    Pain Score  3     Pain Location  Back    Pain Orientation  Lower       TREATMENT  Pre-treatment assessment: L ASIS posterior and inferior   Manual Therapy: STM and TPR performed to B gluteals and sacrospinous and sacrotuberous ligaments to allow for decreased tension and pain and improved posture and function Mobilizations for decreased spasm and improved mobility, grade III, R and L innominates (L superior; R inferior)  Neuromuscular Re-education: Seated MET for SI correction 5 sec holds x10 Seated balance disk, pelvic tilts x10 with coordinated breath Seated balance disk, neutral pelvis holds 4x30 sec Seated balance disk, heel raises, x10 alt Seated balance disk, marches, x10 alt Standing neutral spine alignment at wall Donning/doffing of SI pregnancy support belt with education on when to wear Patient educated on body mechanics for  child care/lifting child.  Post-treatment assessment: ASIS level, L ASIS still posterior  Patient educated throughout session on appropriate technique and form using multi-modal cueing, HEP, and activity modification. Patient articulated understanding and returned demonstration.  Patient Response to interventions: No increase in pain.  ASSESSMENT Patient presents to clinic with excellent motivation to participate in therapy. Patient demonstrates deficits in spinal mobility, posture, pain, L hip mobility, and L hip strength. Patient able to achieve neutral pelvic alignment during today's session and responded positively to educational and active interventions. Patient will benefit from continued skilled therapeutic intervention to address remaining deficits in spinal mobility, posture, pain, L hip mobility, and L hip strength in order to ensure safe and pain free caregiving, increase function, and improve overall QOL.     PT Long Term Goals - 06/02/19 1641      PT LONG TERM GOAL #1   Title  Patient will indicate a score of 62 or greater on FOTO (mODI) to demonstrate improved function.    Baseline  IE: 49    Time  4    Period  Weeks    Status  New    Target Date  06/30/19      PT LONG TERM GOAL #2   Title  Patient will demonstrate improved coordination and pain free spinal mobility as evidenced by (-) L extension-rotation and (-) L  lumbar rotation with appropriate dissociation from hips.    Baseline  IE: (+) L extension-rotation and L lumbar rotation    Time  4    Period  Weeks    Target Date  06/30/19      PT LONG TERM GOAL #3   Title  Patient will demonstrate improved pain-free L hip mobility as evidenced by a (-) FADDIR for improved ability to caregive for children.    Baseline  IE: (+) FADDIR    Time  4    Period  Weeks    Status  New    Target Date  06/30/19      PT LONG TERM GOAL #4   Title  Patient will demonstrate improved B hamstring tissue extensibility as evidenced  by 90-90 popliteal angle >110 degrees for improved ability to caregive for children.    Baseline  IE: B ~95 degrees    Time  4    Period  Weeks    Status  New    Target Date  06/30/19            Plan - 06/13/19 1728    Clinical Impression Statement  Patient presents to clinic with excellent motivation to participate in therapy. Patient demonstrates deficits in spinal mobility, posture, pain, L hip mobility, and L hip strength. Patient able to achieve neutral pelvic alignment during today's session and responded positively to educational and active interventions. Patient will benefit from continued skilled therapeutic intervention to address remaining deficits in spinal mobility, posture, pain, L hip mobility, and L hip strength in order to ensure safe and pain free caregiving, increase function, and improve overall QOL.    Personal Factors and Comorbidities  Age;Education;Sex;Behavior Pattern;Fitness;Time since onset of injury/illness/exacerbation;Past/Current Experience    Examination-Activity Limitations  Carry;Caring for Others;Bend;Locomotion Level;Stand;Lift;Bed Mobility;Squat;Stairs;Transfers;Sit;Sleep    Examination-Participation Restrictions  Laundry;Cleaning;Meal Prep;Interpersonal Relationship;Driving    Stability/Clinical Decision Making  Evolving/Moderate complexity    Rehab Potential  Fair    PT Frequency  1x / week    PT Duration  4 weeks    PT Treatment/Interventions  ADLs/Self Care Home Management;Aquatic Therapy;Cryotherapy;Moist Heat;Stair training;Gait training;Therapeutic exercise;Balance training;Patient/family education;Therapeutic activities;Neuromuscular re-education;Manual techniques;Dry needling;Spinal Manipulations;Joint Manipulations    PT Next Visit Plan  postural re-education    PT Home Exercise Plan  SI joint correction, pelvic tilts, L side stretch    Consulted and Agree with Plan of Care  Patient       Patient will benefit from skilled therapeutic  intervention in order to improve the following deficits and impairments:  Abnormal gait, Decreased balance, Difficulty walking, Increased muscle spasms, Improper body mechanics, Decreased range of motion, Decreased activity tolerance, Postural dysfunction, Pain, Impaired flexibility, Hypermobility, Decreased strength  Visit Diagnosis: 1. Abnormal posture   2. Low back pain, unspecified back pain laterality, unspecified chronicity, unspecified whether sciatica present   3. Muscle weakness (generalized)        Problem List Patient Active Problem List   Diagnosis Date Noted  . 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 History of preterm delivery 06/17/2018  . Maternal varicella, non-immune 11/11/2017  . History of heavy periods 01/02/2017  . Dysmenorrhea 01/02/2017  . Family history of endometriosis in first degree relative 01/02/2017  . Family history of first-degree relative with cardiomyopathy 04/16/2016   Myles Gip PT, DPT [phone] 06/13/2019, 5:36 PM  [place] [facility] [address] [place], [place], [address] Phone: [phone]   Fax:  [phone]  Name: Bonnie Graham MRN: [id number]  Date of Birth: Dec 19, 1995

## 2019-06-20 ENCOUNTER — Encounter: Payer: Medicaid Other | Admitting: Physical Therapy

## 2019-06-21 ENCOUNTER — Ambulatory Visit (INDEPENDENT_AMBULATORY_CARE_PROVIDER_SITE_OTHER): Payer: Medicaid Other

## 2019-06-21 ENCOUNTER — Encounter: Payer: Self-pay | Admitting: Certified Nurse Midwife

## 2019-06-21 ENCOUNTER — Ambulatory Visit (INDEPENDENT_AMBULATORY_CARE_PROVIDER_SITE_OTHER): Payer: Medicaid Other | Admitting: Certified Nurse Midwife

## 2019-06-21 ENCOUNTER — Other Ambulatory Visit: Payer: Self-pay

## 2019-06-21 VITALS — BP 105/70 | HR 98 | Wt 152.6 lb

## 2019-06-21 DIAGNOSIS — Z3491 Encounter for supervision of normal pregnancy, unspecified, first trimester: Secondary | ICD-10-CM

## 2019-06-21 DIAGNOSIS — Z8751 Personal history of pre-term labor: Secondary | ICD-10-CM

## 2019-06-21 DIAGNOSIS — Z3A16 16 weeks gestation of pregnancy: Secondary | ICD-10-CM

## 2019-06-21 DIAGNOSIS — O09212 Supervision of pregnancy with history of pre-term labor, second trimester: Secondary | ICD-10-CM

## 2019-06-21 MED ORDER — HYDROXYPROGESTERONE CAPROATE 275 MG/1.1ML ~~LOC~~ SOAJ
275.0000 mg | Freq: Once | SUBCUTANEOUS | Status: AC
  Administered 2019-06-21: 275 mg via SUBCUTANEOUS

## 2019-06-21 NOTE — Progress Notes (Signed)
ROB doing well . Discussed round ligament pain . Reviewed anatomy u/s at next visit. Starting 17 P injections today. Follow up 1 wk for injections , 4wks for anatomy u/ & ROB with Melody .   Philip Aspen, CNM

## 2019-06-21 NOTE — Patient Instructions (Signed)

## 2019-06-23 ENCOUNTER — Ambulatory Visit: Payer: Medicaid Other | Admitting: Physical Therapy

## 2019-06-27 ENCOUNTER — Encounter: Payer: Medicaid Other | Admitting: Physical Therapy

## 2019-06-28 ENCOUNTER — Other Ambulatory Visit: Payer: Self-pay

## 2019-06-28 ENCOUNTER — Ambulatory Visit (INDEPENDENT_AMBULATORY_CARE_PROVIDER_SITE_OTHER): Payer: Medicaid Other | Admitting: Certified Nurse Midwife

## 2019-06-28 VITALS — BP 115/78 | HR 99 | Ht 63.0 in | Wt 152.7 lb

## 2019-06-28 DIAGNOSIS — Z8751 Personal history of pre-term labor: Secondary | ICD-10-CM | POA: Diagnosis not present

## 2019-06-28 MED ORDER — HYDROXYPROGESTERONE CAPROATE 275 MG/1.1ML ~~LOC~~ SOAJ
275.0000 mg | Freq: Once | SUBCUTANEOUS | Status: AC
  Administered 2019-06-28: 275 mg via SUBCUTANEOUS

## 2019-06-28 MED ORDER — HYDROXYPROGESTERONE CAPROATE 250 MG/ML IM OIL: 250.0000 mg | TOPICAL_OIL | Freq: Once | INTRAMUSCULAR | Status: DC

## 2019-06-29 ENCOUNTER — Encounter: Payer: Self-pay | Admitting: Certified Nurse Midwife

## 2019-06-30 ENCOUNTER — Other Ambulatory Visit: Payer: Self-pay

## 2019-06-30 ENCOUNTER — Encounter: Payer: Self-pay | Admitting: Physical Therapy

## 2019-06-30 ENCOUNTER — Ambulatory Visit: Payer: Medicaid Other | Admitting: Physical Therapy

## 2019-06-30 DIAGNOSIS — R293 Abnormal posture: Secondary | ICD-10-CM | POA: Diagnosis not present

## 2019-06-30 DIAGNOSIS — M545 Low back pain, unspecified: Secondary | ICD-10-CM

## 2019-06-30 DIAGNOSIS — M6281 Muscle weakness (generalized): Secondary | ICD-10-CM

## 2019-07-01 NOTE — Therapy (Signed)
[facility] [address] [place], [place], [address] Phone: [phone]   Fax:  [phone]  Physical Therapy Treatment/ Re-Certification  Patient Details  Name: Bonnie Graham MRN: [id number] Date of Birth: 08/23/1996 Referring Provider (PT): Reatha Harps, CNM   Encounter Date: 06/30/2019  PT End of Session - 06/30/19 1734    Visit Number  4    Number of Visits  10    Date for PT Re-Evaluation  08/11/19    PT Start Time  6063    PT Stop Time  1825    PT Time Calculation (min)  55 min    Activity Tolerance  Patient tolerated treatment well    Behavior During Therapy  [facility] for tasks assessed/performed       Past Medical History:  Diagnosis Date  . Heavy periods   . Painful menstrual periods     Past Surgical History:  Procedure Laterality Date  . NO PAST SURGERIES      There were no vitals filed for this visit.  Subjective Assessment - 06/30/19 1732    Subjective  Patient states that she got a new mattress and it is helping some, but she notes mildly different soreness. She states that she hasn't been as diligent with stretches or HEP.       TREATMENT  Manual Therapy: STM and TPR performed to B gluteals and sacrospinous and sacrotuberous ligaments to allow for decreased tension and pain and improved posture and function Mobilizations for decreased spasm and improved mobility, grade III, R and L innominates (L superior; R inferior)  Neuromuscular Re-education: Reviewed goals; see below.  B Hamstring stretch, x2 min, with diaphragmatic breathing for decreased muscle cramping and improved joint positional awareness B Hamstring progressive and regressive angular isometric loading, x3 bouts Sidelying B heel squeezes with mini clamshell, x30  Sidelying B glute/piriformis stretch, x2 min, with diaphragmatic breathing for decreased muscle cramping and improved joint positional awareness Seated B hip ER  capsular stretch for improved joint positional awareness, x2 min with diaphragmatic breathing Patient education on body mechanics and lifting during child care.  Patient educated throughout session on appropriate technique and form using multi-modal cueing, HEP, and activity modification. Patient articulated understanding and returned demonstration.  Patient Response to interventions: No increase in pain.  ASSESSMENT Patient presents to clinic with excellent motivation to participate in therapy. Patient demonstrates deficits in spinal mobility, posture, pain, L hip mobility, and L hip strength. Patient able to achieve increased hamstring extensibility during today's session and responded positively to educational and active interventions. Patient has made good progress in very few visits; patient's condition has the potential to improve further in response to therapy. Maximum improvement is yet to be obtained. The anticipated improvement is attainable and reasonable in a generally predictable time. Patient will benefit from continued skilled therapeutic intervention to address remaining deficits in spinal mobility, posture, pain, L hip mobility, and L hip strength in order to ensure safe and pain free caregiving, increase function, and improve overall QOL.     PT Long Term Goals - 06/30/19 1735      PT LONG TERM GOAL #1   Title  Patient will indicate a score of 62 or greater on FOTO (mODI) to demonstrate improved function.    Baseline  IE: 42; 06/30/2019:    Time  6    Period  Weeks    Status  New    Target Date  08/11/19  PT LONG TERM GOAL #2   Title  Patient will demonstrate improved coordination and pain free spinal mobility as evidenced by (-) L extension-rotation and (-) L lumbar rotation with appropriate dissociation from hips.    Baseline  IE: (+) L extension-rotation and L lumbar rotation; 06/30/2019: (+) L extension-rotation, (-) L rotation    Time  6    Period  Weeks     Target Date  08/11/19      PT LONG TERM GOAL #3   Title  Patient will demonstrate improved pain-free L hip mobility as evidenced by a (-) FADDIR for improved ability to caregive for children.    Baseline  IE: (+) FADDIR, 06/30/2019: (-) L FADDIR, (+) R FADDIR    Time  6    Period  Weeks    Status  New    Target Date  08/11/19      PT LONG TERM GOAL #4   Title  Patient will demonstrate improved B hamstring tissue extensibility as evidenced by 90-90 popliteal angle >160 degrees for improved ability to caregive for children.    Baseline  IE: B ~95 degrees; 06/30/2019: L 133 degrees, R 146 degrees    Time  6    Period  Weeks    Status  Revised    Target Date  08/11/19            Plan - 06/30/19 1735    Clinical Impression Statement  Patient presents to clinic with excellent motivation to participate in therapy. Patient demonstrates deficits in spinal mobility, posture, pain, L hip mobility, and L hip strength. Patient able to achieve increased hamstring extensibility during today's session and responded positively to educational and active interventions. Patient has made good progress in very few visits; patient's condition has the potential to improve further in response to therapy. Maximum improvement is yet to be obtained. The anticipated improvement is attainable and reasonable in a generally predictable time. Patient will benefit from continued skilled therapeutic intervention to address remaining deficits in spinal mobility, posture, pain, L hip mobility, and L hip strength in order to ensure safe and pain free caregiving, increase function, and improve overall QOL.    Personal Factors and Comorbidities  Age;Education;Sex;Behavior Pattern;Fitness;Time since onset of injury/illness/exacerbation;Past/Current Experience    Examination-Activity Limitations  Carry;Caring for Others;Bend;Locomotion Level;Stand;Lift;Bed Mobility;Squat;Stairs;Transfers;Sit;Sleep    Examination-Participation  Restrictions  Laundry;Cleaning;Meal Prep;Interpersonal Relationship;Driving    Stability/Clinical Decision Making  Evolving/Moderate complexity    Rehab Potential  Fair    PT Frequency  1x / week    PT Duration  6 weeks    PT Treatment/Interventions  ADLs/Self Care Home Management;Aquatic Therapy;Cryotherapy;Moist Heat;Stair training;Gait training;Therapeutic exercise;Balance training;Patient/family education;Therapeutic activities;Neuromuscular re-education;Manual techniques;Dry needling;Spinal Manipulations;Joint Manipulations    PT Next Visit Plan  IAP basics, postural re-education    PT Home Exercise Plan  SI joint correction, pelvic tilts, L side stretch    Consulted and Agree with Plan of Care  Patient       Patient will benefit from skilled therapeutic intervention in order to improve the following deficits and impairments:  Abnormal gait, Decreased balance, Difficulty walking, Increased muscle spasms, Improper body mechanics, Decreased range of motion, Decreased activity tolerance, Postural dysfunction, Pain, Impaired flexibility, Hypermobility, Decreased strength  Visit Diagnosis: Abnormal posture  Low back pain, unspecified back pain laterality, unspecified chronicity, unspecified whether sciatica present  Muscle weakness (generalized)     Problem List Patient Active Problem List   Diagnosis Date Noted  . 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  History of preterm delivery 06/17/2018  . Maternal varicella, non-immune 11/11/2017  . History of heavy periods 01/02/2017  . Dysmenorrhea 01/02/2017  . Family history of endometriosis in first degree relative 01/02/2017  . Family history of first-degree relative with cardiomyopathy 04/16/2016   Sheria LangKatlin Britanny Marksberry PT, DPT [phone] 07/01/2019, 8:07 AM  [place] [facility] [address] [place], [place], [address] Phone: [phone]   Fax:  [phone]  Name: Bonnie Graham MRN: [id number] Date of  Birth: 12/30/1995

## 2019-07-04 ENCOUNTER — Ambulatory Visit: Payer: Medicaid Other | Admitting: Physical Therapy

## 2019-07-04 ENCOUNTER — Encounter: Payer: Self-pay | Admitting: Physical Therapy

## 2019-07-04 ENCOUNTER — Other Ambulatory Visit: Payer: Self-pay

## 2019-07-04 DIAGNOSIS — M545 Low back pain, unspecified: Secondary | ICD-10-CM

## 2019-07-04 DIAGNOSIS — M6281 Muscle weakness (generalized): Secondary | ICD-10-CM

## 2019-07-04 DIAGNOSIS — R293 Abnormal posture: Secondary | ICD-10-CM | POA: Diagnosis not present

## 2019-07-04 NOTE — Therapy (Signed)
[facility] [address]. [place], [place], [address] Phone: [phone]   Fax:  [phone]  Physical Therapy Treatment  Patient Details  Name: Bonnie Graham MRN: [id number] Date of Birth: 1996-05-17 Referring Provider (PT): Reatha Harps, CNM   Encounter Date: 07/04/2019  PT End of Session - 07/04/19 1536    Visit Number  5    Number of Visits  10    Date for PT Re-Evaluation  08/11/19    PT Start Time  0109    PT Stop Time  1630    PT Time Calculation (min)  55 min    Activity Tolerance  Patient tolerated treatment well    Behavior During Therapy  [facility] for tasks assessed/performed       Past Medical History:  Diagnosis Date  . Heavy periods   . Painful menstrual periods     Past Surgical History:  Procedure Laterality Date  . NO PAST SURGERIES      There were no vitals filed for this visit.  Subjective Assessment - 07/04/19 1535    Subjective  Patient reports no significant changes since her last visit. She states that things feel good.    Currently in Pain?  No/denies       TREATMENT  Manual Therapy: STM and TPR performed to B gluteals and sacrospinous and sacrotuberous ligaments to allow for decreased tension and pain and improved posture and function  Neuromuscular Re-education: Sidelying, diaphragmatic breathing with VCs and TCs for improved management of IAP Sidelying, PFM lengthening with inhalation. VCs and TCs to decrease compensatory patterns and encourage optimal relaxation of the PFM. Sidelying, TrA activation with exhalation (3 sec hold x5). VCs and TCs to decrease compensatory patterns and minimize aggravation of the lumbar paraspinals. Quadruped, TrA activation with exhalation (3 sec hold x5). VCs and TCs to decrease compensatory patterns and minimize aggravation of the lumbar paraspinals. Swiss Ball pelvic tilts, anterior/posterior x20, pelvic clocks x6 each direction, deadbug  modified (UE only) x5 each, all for improved postural awareness and endurance  Patient educated throughout session on appropriate technique and form using multi-modal cueing, HEP, and activity modification. Patient articulated understanding and returned demonstration.  Patient Response to interventions: No increase in pain.  ASSESSMENT Patient presents to clinic with excellent motivation to participate in therapy. Patient demonstrates deficits in spinal mobility, posture, pain, L hip mobility, and L hip strength. Patient able to achieve neutral pelvis seated on an unstable surface during today's session and responded positively to active interventions. Patient will benefit from continued skilled therapeutic intervention to address remaining deficits in spinal mobility, posture, pain, L hip mobility, and L hip strength in order to ensure safe and pain free caregiving, increase function, and improve overall QOL.    PT Long Term Goals - 06/30/19 1735      PT LONG TERM GOAL #1   Title  Patient will indicate a score of 62 or greater on FOTO (mODI) to demonstrate improved function.    Baseline  IE: 66; 06/30/2019:    Time  6    Period  Weeks    Status  New    Target Date  08/11/19      PT LONG TERM GOAL #2   Title  Patient will demonstrate improved coordination and pain free spinal mobility as evidenced by (-) L extension-rotation and (-) L lumbar rotation with appropriate dissociation from hips.    Baseline  IE: (+) L extension-rotation and L lumbar rotation;  06/30/2019: (+) L extension-rotation, (-) L rotation    Time  6    Period  Weeks    Target Date  08/11/19      PT LONG TERM GOAL #3   Title  Patient will demonstrate improved pain-free L hip mobility as evidenced by a (-) FADDIR for improved ability to caregive for children.    Baseline  IE: (+) FADDIR, 06/30/2019: (-) L FADDIR, (+) R FADDIR    Time  6    Period  Weeks    Status  New    Target Date  08/11/19      PT LONG TERM GOAL  #4   Title  Patient will demonstrate improved B hamstring tissue extensibility as evidenced by 90-90 popliteal angle >160 degrees for improved ability to caregive for children.    Baseline  IE: B ~95 degrees; 06/30/2019: L 133 degrees, R 146 degrees    Time  6    Period  Weeks    Status  Revised    Target Date  08/11/19            Plan - 07/04/19 1537    Clinical Impression Statement  Patient presents to clinic with excellent motivation to participate in therapy. Patient demonstrates deficits in spinal mobility, posture, pain, L hip mobility, and L hip strength. Patient able to achieve neutral pelvis seated on an unstable surface during today's session and responded positively to active interventions. Patient will benefit from continued skilled therapeutic intervention to address remaining deficits in spinal mobility, posture, pain, L hip mobility, and L hip strength in order to ensure safe and pain free caregiving, increase function, and improve overall QOL.    Personal Factors and Comorbidities  Age;Education;Sex;Behavior Pattern;Fitness;Time since onset of injury/illness/exacerbation;Past/Current Experience    Examination-Activity Limitations  Carry;Caring for Others;Bend;Locomotion Level;Stand;Lift;Bed Mobility;Squat;Stairs;Transfers;Sit;Sleep    Examination-Participation Restrictions  Laundry;Cleaning;Meal Prep;Interpersonal Relationship;Driving    Stability/Clinical Decision Making  Evolving/Moderate complexity    Rehab Potential  Fair    PT Frequency  1x / week    PT Duration  6 weeks    PT Treatment/Interventions  ADLs/Self Care Home Management;Aquatic Therapy;Cryotherapy;Moist Heat;Stair training;Gait training;Therapeutic exercise;Balance training;Patient/family education;Therapeutic activities;Neuromuscular re-education;Manual techniques;Dry needling;Spinal Manipulations;Joint Manipulations    PT Next Visit Plan  IAP basics, postural re-education    PT Home Exercise Plan  SI joint  correction, pelvic tilts, L side stretch    Consulted and Agree with Plan of Care  Patient       Patient will benefit from skilled therapeutic intervention in order to improve the following deficits and impairments:  Abnormal gait, Decreased balance, Difficulty walking, Increased muscle spasms, Improper body mechanics, Decreased range of motion, Decreased activity tolerance, Postural dysfunction, Pain, Impaired flexibility, Hypermobility, Decreased strength  Visit Diagnosis: Abnormal posture  Low back pain, unspecified back pain laterality, unspecified chronicity, unspecified whether sciatica present  Muscle weakness (generalized)     Problem List Patient Active Problem List   Diagnosis Date Noted  . 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 History of preterm delivery 06/17/2018  . Maternal varicella, non-immune 11/11/2017  . History of heavy periods 01/02/2017  . Dysmenorrhea 01/02/2017  . Family history of endometriosis in first degree relative 01/02/2017  . Family history of first-degree relative with cardiomyopathy 04/16/2016   Sheria LangKatlin Ward Boissonneault PT, DPT [phone] 07/04/2019, 5:22 PM  [place] [facility] [address] [place], [place], [address] Phone: [phone]   Fax:  [phone]  Name: Bonnie Graham MRN: [id number] Date of Birth:  10/06/1996   

## 2019-07-05 ENCOUNTER — Other Ambulatory Visit: Payer: Self-pay

## 2019-07-05 ENCOUNTER — Ambulatory Visit (INDEPENDENT_AMBULATORY_CARE_PROVIDER_SITE_OTHER): Payer: Medicaid Other | Admitting: Certified Nurse Midwife

## 2019-07-05 VITALS — BP 112/76 | HR 93 | Wt 153.4 lb

## 2019-07-05 DIAGNOSIS — Z3A18 18 weeks gestation of pregnancy: Secondary | ICD-10-CM | POA: Diagnosis not present

## 2019-07-05 DIAGNOSIS — O09212 Supervision of pregnancy with history of pre-term labor, second trimester: Secondary | ICD-10-CM

## 2019-07-05 DIAGNOSIS — Z79899 Other long term (current) drug therapy: Secondary | ICD-10-CM

## 2019-07-05 MED ORDER — HYDROXYPROGESTERONE CAPROATE 250 MG/ML IM OIL
250.0000 mg | TOPICAL_OIL | Freq: Once | INTRAMUSCULAR | Status: AC
  Administered 2019-07-05: 250 mg via INTRAMUSCULAR

## 2019-07-05 NOTE — Progress Notes (Signed)
Patient presents for a NV for a 17P injection.

## 2019-07-08 ENCOUNTER — Telehealth: Payer: Self-pay

## 2019-07-08 NOTE — Telephone Encounter (Signed)
mychart message sent

## 2019-07-11 ENCOUNTER — Ambulatory Visit (INDEPENDENT_AMBULATORY_CARE_PROVIDER_SITE_OTHER): Payer: Medicaid Other | Admitting: Certified Nurse Midwife

## 2019-07-11 ENCOUNTER — Other Ambulatory Visit: Payer: Self-pay

## 2019-07-11 VITALS — BP 118/71 | HR 87 | Ht 63.0 in | Wt 154.8 lb

## 2019-07-11 DIAGNOSIS — Z79899 Other long term (current) drug therapy: Secondary | ICD-10-CM

## 2019-07-11 DIAGNOSIS — O0912 Supervision of pregnancy with history of ectopic or molar pregnancy, second trimester: Secondary | ICD-10-CM

## 2019-07-11 DIAGNOSIS — Z3A19 19 weeks gestation of pregnancy: Secondary | ICD-10-CM

## 2019-07-11 MED ORDER — HYDROXYPROGESTERONE CAPROATE 275 MG/1.1ML ~~LOC~~ SOAJ
275.0000 mg | Freq: Once | SUBCUTANEOUS | Status: AC
  Administered 2019-07-11: 275 mg via SUBCUTANEOUS

## 2019-07-11 NOTE — Progress Notes (Signed)
Pt is present today for 17 P injeciton. Pt stated that she is not having side effects from the medication.    BP 118/71   Pulse 87   Ht 5\' 3"  (1.6 m)   Wt 154 lb 12.8 oz (70.2 kg)   LMP 02/28/2019   BMI 27.42 kg/m     Injection given in right arm at this visit.

## 2019-07-12 ENCOUNTER — Ambulatory Visit: Payer: Medicaid Other | Attending: Certified Nurse Midwife | Admitting: Physical Therapy

## 2019-07-12 ENCOUNTER — Encounter: Payer: Self-pay | Admitting: Physical Therapy

## 2019-07-12 DIAGNOSIS — M6281 Muscle weakness (generalized): Secondary | ICD-10-CM | POA: Diagnosis present

## 2019-07-12 DIAGNOSIS — R293 Abnormal posture: Secondary | ICD-10-CM

## 2019-07-12 DIAGNOSIS — M545 Low back pain, unspecified: Secondary | ICD-10-CM

## 2019-07-12 NOTE — Therapy (Signed)
[facility] [address] [place], [place], [address] Phone: [phone]   Fax:  [phone]  Physical Therapy Treatment  Patient Details  Name: Bonnie Graham MRN: [id number] Date of Birth: [birth year] Referring Provider (PT): Reatha Harps, CNM   Encounter Date: 07/12/2019  PT End of Session - 07/12/19 1605    Visit Number  6    Number of Visits  10    Date for PT Re-Evaluation  08/11/19    PT Start Time  1600    PT Stop Time  1655    PT Time Calculation (min)  55 min    Activity Tolerance  Patient tolerated treatment well    Behavior During Therapy  [facility] for tasks assessed/performed       Past Medical History:  Diagnosis Date  . Heavy periods   . Painful menstrual periods     Past Surgical History:  Procedure Laterality Date  . NO PAST SURGERIES      There were no vitals filed for this visit.  Subjective Assessment - 07/12/19 1604    Subjective  Patient denies any significant concerns since her last visit. She did have one incident lateral hip pain on L when she was walking. The pain only lasted a few hours and then went away.       TREATMENT  Neuromuscular Re-education: Quadruped, TrA activation with exhalation (3 sec hold x5). VCs and TCs to decrease compensatory patterns and minimize aggravation of the lumbar paraspinals. Child's Pose x5 breaths for improved spinal mobility Cat/Cow with coordinated breath pattern for improved spinal mobility Balance disc pelvic tilts, anterior/posterior x20 for improved postural awareness and endurance Standing Pilates Postural Control Hug a Tree, RTB, x10 Serve a Tray, RTB, x10 Reverse Hug a Tree, RTB, x10 Reverse Serve a Tray, RTB, x10 Seated heel squeezes for SIJ and pubic symphysis stabilization, 3 sec x10 Hamstring stretch, B, 2x 2 min   Patient educated throughout session on appropriate technique and form using multi-modal cueing, HEP, and activity  modification. Patient articulated understanding and returned demonstration.  Patient Response to interventions: No increase in pain.  ASSESSMENT Patient presents to clinic with excellent motivation to participate in therapy. Patient demonstrates deficits in spinal mobility, posture, pain, L hip mobility, and L hip strength. Patient continues to be able to achieve neutral pelvis seated on an unstable surface during today's session and was also able to find the position in standing with UE challenge. Patient will benefit from continued skilled therapeutic intervention to address remaining deficits in spinal mobility, posture, pain, L hip mobility, and L hip strength in order to ensure safe and pain free caregiving, increase function, and improve overall QOL.     PT Long Term Goals - 06/30/19 1735      PT LONG TERM GOAL #1   Title  Patient will indicate a score of 62 or greater on FOTO (mODI) to demonstrate improved function.    Baseline  IE: 42; 06/30/2019:    Time  6    Period  Weeks    Status  New    Target Date  08/11/19      PT LONG TERM GOAL #2   Title  Patient will demonstrate improved coordination and pain free spinal mobility as evidenced by (-) L extension-rotation and (-) L lumbar rotation with appropriate dissociation from hips.    Baseline  IE: (+) L extension-rotation and L lumbar rotation; 06/30/2019: (+) L extension-rotation, (-) L rotation  Time  6    Period  Weeks    Target Date  08/11/19      PT LONG TERM GOAL #3   Title  Patient will demonstrate improved pain-free L hip mobility as evidenced by a (-) FADDIR for improved ability to caregive for children.    Baseline  IE: (+) FADDIR, 06/30/2019: (-) L FADDIR, (+) R FADDIR    Time  6    Period  Weeks    Status  New    Target Date  08/11/19      PT LONG TERM GOAL #4   Title  Patient will demonstrate improved B hamstring tissue extensibility as evidenced by 90-90 popliteal angle >160 degrees for improved ability to  caregive for children.    Baseline  IE: B ~95 degrees; 06/30/2019: L 133 degrees, R 146 degrees    Time  6    Period  Weeks    Status  Revised    Target Date  08/11/19            Plan - 07/12/19 1605    Clinical Impression Statement  Patient presents to clinic with excellent motivation to participate in therapy. Patient demonstrates deficits in spinal mobility, posture, pain, L hip mobility, and L hip strength. Patient continues to be able to achieve neutral pelvis seated on an unstable surface during today's session and was also able to find the position in standing with UE challenge. Patient will benefit from continued skilled therapeutic intervention to address remaining deficits in spinal mobility, posture, pain, L hip mobility, and L hip strength in order to ensure safe and pain free caregiving, increase function, and improve overall QOL.    Personal Factors and Comorbidities  Age;Education;Sex;Behavior Pattern;Fitness;Time since onset of injury/illness/exacerbation;Past/Current Experience    Examination-Activity Limitations  Carry;Caring for Others;Bend;Locomotion Level;Stand;Lift;Bed Mobility;Squat;Stairs;Transfers;Sit;Sleep    Examination-Participation Restrictions  Laundry;Cleaning;Meal Prep;Interpersonal Relationship;Driving    Stability/Clinical Decision Making  Evolving/Moderate complexity    Rehab Potential  Fair    PT Frequency  1x / week    PT Duration  6 weeks    PT Treatment/Interventions  ADLs/Self Care Home Management;Aquatic Therapy;Cryotherapy;Moist Heat;Stair training;Gait training;Therapeutic exercise;Balance training;Patient/family education;Therapeutic activities;Neuromuscular re-education;Manual techniques;Dry needling;Spinal Manipulations;Joint Manipulations    PT Next Visit Plan  IAP basics, postural re-education    PT Home Exercise Plan  SI joint correction, pelvic tilts, L side stretch    Consulted and Agree with Plan of Care  Patient       Patient will  benefit from skilled therapeutic intervention in order to improve the following deficits and impairments:  Abnormal gait, Decreased balance, Difficulty walking, Increased muscle spasms, Improper body mechanics, Decreased range of motion, Decreased activity tolerance, Postural dysfunction, Pain, Impaired flexibility, Hypermobility, Decreased strength  Visit Diagnosis: Abnormal posture  Low back pain, unspecified back pain laterality, unspecified chronicity, unspecified whether sciatica present  Muscle weakness (generalized)     Problem List Patient Active Problem List   Diagnosis Date Noted  . 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 History of preterm delivery 06/17/2018  . Maternal varicella, non-immune 11/11/2017  . History of heavy periods 01/02/2017  . Dysmenorrhea 01/02/2017  . Family history of endometriosis in first degree relative 01/02/2017  . Family history of first-degree relative with cardiomyopathy 04/16/2016   Sheria LangKatlin Khaliq Turay PT, DPT [phone] 07/12/2019, 5:25 PM  [place] [facility] [address] [place], [place], [address] Phone: [phone]   Fax:  [phone]  Name: Bonnie Graham MRN: [id number] Date of Birth:  06/19/1996   

## 2019-07-19 ENCOUNTER — Other Ambulatory Visit: Payer: Self-pay

## 2019-07-19 ENCOUNTER — Ambulatory Visit: Payer: Medicaid Other | Admitting: Physical Therapy

## 2019-07-19 ENCOUNTER — Encounter: Payer: Self-pay | Admitting: Physical Therapy

## 2019-07-19 DIAGNOSIS — M545 Low back pain, unspecified: Secondary | ICD-10-CM

## 2019-07-19 DIAGNOSIS — M6281 Muscle weakness (generalized): Secondary | ICD-10-CM

## 2019-07-19 DIAGNOSIS — R293 Abnormal posture: Secondary | ICD-10-CM

## 2019-07-19 NOTE — Therapy (Signed)
[facility] [address] [place], [place], [address] Phone: [phone]   Fax:  [phone]  Physical Therapy Treatment  Patient Details  Name: Bonnie Graham MRN: [id number] Date of Birth: 07/12/1996 Referring Provider (PT): Reatha Harps, CNM   Encounter Date: 07/19/2019  PT End of Session - 07/19/19 1605    Visit Number  7    Number of Visits  10    Date for PT Re-Evaluation  08/11/19    PT Start Time  1600    PT Stop Time  1655    PT Time Calculation (min)  55 min    Activity Tolerance  Patient tolerated treatment well    Behavior During Therapy  [facility] for tasks assessed/performed       Past Medical History:  Diagnosis Date  . Heavy periods   . Painful menstrual periods     Past Surgical History:  Procedure Laterality Date  . NO PAST SURGERIES      There were no vitals filed for this visit.  Subjective Assessment - 07/19/19 1604    Subjective  Patient denies any significant changes since her last visit and has not had a repeat occurrence of the L hip pain during walking. Patient reports some increased pain after a ~1.5 mile walk on Saturday and saw some increased swelling in her sacral area.       TREATMENT  Neuromuscular Re-education: Standing TrA activation at wall with towel roll; coordinated breath. x10 at two levels. VCs and TCs to decrease compensatory patterns and minimize aggravation of the lumbar paraspinals. Quadruped, TrA activation with exhalation (3 sec hold x10). VCs and TCs to decrease compensatory patterns and minimize aggravation of the lumbar paraspinals. Child's Pose x5 breaths for improved spinal mobility Cat/Cow with coordinated breath pattern for improved spinal mobility Balance disc pelvic tilts, anterior/posterior x20, pelvic clocks x5 each direction, x10 marches (B) for improved postural awareness and endurance  Patient educated throughout session on appropriate technique and  form using multi-modal cueing, HEP, and activity modification. Patient articulated understanding and returned demonstration.  Patient Response to interventions: No increase in pain.  ASSESSMENT Patient presents to clinic with excellent motivation to participate in therapy. Patient demonstrates deficits in spinal mobility, posture, pain, L hip mobility, and L hip strength. Patient continues to be able to achieve neutral pelvis across postures during today's session. Patient will benefit from continued skilled therapeutic intervention to address remaining deficits in spinal mobility, posture, pain, L hip mobility, and L hip strength in order to ensure safe and pain free caregiving, increase function, and improve overall QOL.    PT Long Term Goals - 06/30/19 1735      PT LONG TERM GOAL #1   Title  Patient will indicate a score of 62 or greater on FOTO (mODI) to demonstrate improved function.    Baseline  IE: 32; 06/30/2019:    Time  6    Period  Weeks    Status  New    Target Date  08/11/19      PT LONG TERM GOAL #2   Title  Patient will demonstrate improved coordination and pain free spinal mobility as evidenced by (-) L extension-rotation and (-) L lumbar rotation with appropriate dissociation from hips.    Baseline  IE: (+) L extension-rotation and L lumbar rotation; 06/30/2019: (+) L extension-rotation, (-) L rotation    Time  6    Period  Weeks    Target Date  08/11/19      PT LONG TERM GOAL #3   Title  Patient will demonstrate improved pain-free L hip mobility as evidenced by a (-) FADDIR for improved ability to caregive for children.    Baseline  IE: (+) FADDIR, 06/30/2019: (-) L FADDIR, (+) R FADDIR    Time  6    Period  Weeks    Status  New    Target Date  08/11/19      PT LONG TERM GOAL #4   Title  Patient will demonstrate improved B hamstring tissue extensibility as evidenced by 90-90 popliteal angle >160 degrees for improved ability to caregive for children.    Baseline   IE: B ~95 degrees; 06/30/2019: L 133 degrees, R 146 degrees    Time  6    Period  Weeks    Status  Revised    Target Date  08/11/19            Plan - 07/19/19 1605    Clinical Impression Statement  Patient presents to clinic with excellent motivation to participate in therapy. Patient demonstrates deficits in spinal mobility, posture, pain, L hip mobility, and L hip strength. Patient continues to be able to achieve neutral pelvis across postures during today's session. Patient will benefit from continued skilled therapeutic intervention to address remaining deficits in spinal mobility, posture, pain, L hip mobility, and L hip strength in order to ensure safe and pain free caregiving, increase function, and improve overall QOL.    Personal Factors and Comorbidities  Age;Education;Sex;Behavior Pattern;Fitness;Time since onset of injury/illness/exacerbation;Past/Current Experience    Examination-Activity Limitations  Carry;Caring for Others;Bend;Locomotion Level;Stand;Lift;Bed Mobility;Squat;Stairs;Transfers;Sit;Sleep    Examination-Participation Restrictions  Laundry;Cleaning;Meal Prep;Interpersonal Relationship;Driving    Stability/Clinical Decision Making  Evolving/Moderate complexity    Rehab Potential  Fair    PT Frequency  1x / week    PT Duration  6 weeks    PT Treatment/Interventions  ADLs/Self Care Home Management;Aquatic Therapy;Cryotherapy;Moist Heat;Stair training;Gait training;Therapeutic exercise;Balance training;Patient/family education;Therapeutic activities;Neuromuscular re-education;Manual techniques;Dry needling;Spinal Manipulations;Joint Manipulations    PT Next Visit Plan  IAP basics, postural re-education    PT Home Exercise Plan  SI joint correction, pelvic tilts, L side stretch    Consulted and Agree with Plan of Care  Patient       Patient will benefit from skilled therapeutic intervention in order to improve the following deficits and impairments:  Abnormal gait,  Decreased balance, Difficulty walking, Increased muscle spasms, Improper body mechanics, Decreased range of motion, Decreased activity tolerance, Postural dysfunction, Pain, Impaired flexibility, Hypermobility, Decreased strength  Visit Diagnosis: Abnormal posture  Low back pain, unspecified back pain laterality, unspecified chronicity, unspecified whether sciatica present  Muscle weakness (generalized)     Problem List Patient Active Problem List   Diagnosis Date Noted  . 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 History of preterm delivery 06/17/2018  . Maternal varicella, non-immune 11/11/2017  . History of heavy periods 01/02/2017  . Dysmenorrhea 01/02/2017  . Family history of endometriosis in first degree relative 01/02/2017  . Family history of first-degree relative with cardiomyopathy 04/16/2016   Sheria LangKatlin Sherisse Fullilove PT, DPT [phone] 07/19/2019, 5:39 PM  [facility] [address] [place], [place], [address] Phone: [phone]   Fax:  [phone]  Name: Clista BernhardtHannah Grace Graham MRN: [id number] Date of Birth: 06/12/1996

## 2019-07-20 ENCOUNTER — Other Ambulatory Visit: Payer: Self-pay

## 2019-07-20 ENCOUNTER — Other Ambulatory Visit: Payer: Medicaid Other

## 2019-07-20 ENCOUNTER — Ambulatory Visit (INDEPENDENT_AMBULATORY_CARE_PROVIDER_SITE_OTHER): Payer: Medicaid Other | Admitting: Obstetrics and Gynecology

## 2019-07-20 VITALS — BP 107/69 | HR 72 | Wt 156.1 lb

## 2019-07-20 DIAGNOSIS — M5432 Sciatica, left side: Secondary | ICD-10-CM

## 2019-07-20 DIAGNOSIS — Z3490 Encounter for supervision of normal pregnancy, unspecified, unspecified trimester: Secondary | ICD-10-CM

## 2019-07-20 DIAGNOSIS — R600 Localized edema: Secondary | ICD-10-CM

## 2019-07-20 DIAGNOSIS — O09212 Supervision of pregnancy with history of pre-term labor, second trimester: Secondary | ICD-10-CM | POA: Diagnosis not present

## 2019-07-20 DIAGNOSIS — O9989 Other specified diseases and conditions complicating pregnancy, childbirth and the puerperium: Secondary | ICD-10-CM

## 2019-07-20 DIAGNOSIS — Z3A21 21 weeks gestation of pregnancy: Secondary | ICD-10-CM

## 2019-07-20 DIAGNOSIS — M5431 Sciatica, right side: Secondary | ICD-10-CM

## 2019-07-20 LAB — POCT URINALYSIS DIPSTICK OB
Bilirubin, UA: NEGATIVE
Blood, UA: NEGATIVE
Glucose, UA: NEGATIVE
Ketones, UA: NEGATIVE
Leukocytes, UA: NEGATIVE
Nitrite, UA: NEGATIVE
POC,PROTEIN,UA: NEGATIVE
Spec Grav, UA: 1.01 (ref 1.010–1.025)
Urobilinogen, UA: 0.2 E.U./dL
pH, UA: 5 (ref 5.0–8.0)

## 2019-07-20 MED ORDER — HYDROXYPROGESTERONE CAPROATE 250 MG/ML IM OIL
250.0000 mg | TOPICAL_OIL | Freq: Once | INTRAMUSCULAR | Status: AC
  Administered 2019-07-20: 250 mg via INTRAMUSCULAR

## 2019-07-20 NOTE — Progress Notes (Signed)
ROB- needs anatomy scan, has been going to PT weekly and it is helping, but notes swelling at sacrum- picture taking and reviewed(patient will send to [facility]) denies ever injuring the area, but does report tenderness to touch when swollen. Will see ablut referral to ortho.

## 2019-07-25 ENCOUNTER — Telehealth: Payer: Self-pay | Admitting: Certified Nurse Midwife

## 2019-07-25 ENCOUNTER — Other Ambulatory Visit: Payer: Self-pay

## 2019-07-25 ENCOUNTER — Ambulatory Visit: Payer: Medicaid Other | Admitting: Physical Therapy

## 2019-07-25 ENCOUNTER — Telehealth: Payer: Self-pay

## 2019-07-25 ENCOUNTER — Encounter: Payer: Self-pay | Admitting: Physical Therapy

## 2019-07-25 ENCOUNTER — Encounter: Payer: Self-pay | Admitting: Certified Nurse Midwife

## 2019-07-25 DIAGNOSIS — M545 Low back pain, unspecified: Secondary | ICD-10-CM

## 2019-07-25 DIAGNOSIS — M6281 Muscle weakness (generalized): Secondary | ICD-10-CM

## 2019-07-25 DIAGNOSIS — R293 Abnormal posture: Secondary | ICD-10-CM

## 2019-07-25 NOTE — Telephone Encounter (Signed)
Called and spoke with Nira Conn at [facility], they wanted to verify our office address and let us know they will ship 1 Makena injection now while trying to figure out insurance coverage.

## 2019-07-25 NOTE — Telephone Encounter (Signed)
Caryl Pina from an delivery pharmacy called to verify pt's information for delivery. When I stated the patient's information the rep stated that a different address and information was listed on the patients form that was submitted. Caryl Pina is requesting a call back at [phone]. Please advise.

## 2019-07-25 NOTE — Telephone Encounter (Signed)
Received a call from Vicente Males at [facility]- states pt is having difficulty obtaining Makena due to Medicaid issues. Vicente Males stated she needs our permission to send a request for a one time shipment of makena until insurance gets straightened out. Permission granted. She will send to her supervisor for confirmation.

## 2019-07-26 ENCOUNTER — Other Ambulatory Visit: Payer: Self-pay

## 2019-07-26 ENCOUNTER — Ambulatory Visit
Admission: RE | Admit: 2019-07-26 | Discharge: 2019-07-26 | Disposition: A | Discharge status: Home / Self Care | Payer: Medicaid Other | Source: Ambulatory Visit | Attending: Obstetrics and Gynecology | Admitting: Obstetrics and Gynecology

## 2019-07-26 DIAGNOSIS — Z3490 Encounter for supervision of normal pregnancy, unspecified, unspecified trimester: Secondary | ICD-10-CM | POA: Insufficient documentation

## 2019-07-26 NOTE — Therapy (Signed)
[facility] [address] [place], [place], [address] Phone: [phone]   Fax:  [phone]  Physical Therapy Treatment  Patient Details  Name: Bonnie BernhardtHannah Grace Graham MRN: [id number] Date of Birth: 07/20/1996 Referring Provider (PT): Illene RegulusLawhorne, Michelle Jenkins, CNM   Encounter Date: 07/25/2019  PT End of Session - 07/25/19 1647    Visit Number  8    Number of Visits  10    Date for PT Re-Evaluation  08/11/19    PT Start Time  1630    PT Stop Time  1725    PT Time Calculation (min)  55 min    Activity Tolerance  Patient tolerated treatment well    Behavior During Therapy  [facility] for tasks assessed/performed       Past Medical History:  Diagnosis Date  . Heavy periods   . Painful menstrual periods     Past Surgical History:  Procedure Laterality Date  . NO PAST SURGERIES      There were no vitals filed for this visit.  Subjective Assessment - 07/25/19 1633    Subjective  Patient notes that she spoke to her CNM about the sacral edema and the CNM was going to consult with an orthopedist. Patient reports she feels a little unsettled by the consultation. She reports good compliance with seated HEP and does these when she i sin pain to help modulate the pain followed by rest. Patient has increased back pain after walks of > 40 min which are also when she notices increased sacral edema.    Currently in Pain?  No/denies       TREATMENT  Neuromuscular Re-education: Standing Pilates Postural Control Hug a Tree, RTB, x10 Serve a Tray, RTB, x10 Reverse Hug a Tree, RTB, x10 Reverse Serve a Tray, RTB, x10 Standing TrA activation with counter-press for improved postural awareness and endurance, x20  Therapeutic Activities: Body mechanics for lifting child from floor:  Half kneeling, #17 CW, x4 each leg  Half kneeling, #10 med ball, from elevated surface (8"), x3 each leg  Squatting, #10 med ball, from elevated surface (8"), x6 VCs and  TCs to optimize form and prevent compensations.  Patient educated throughout session on appropriate technique and form using multi-modal cueing, HEP, and activity modification. Patient articulated understanding and returned demonstration.  Patient Response to interventions: Denies any increase in pain in standing.  ASSESSMENT Patient presents to clinic with excellent motivation to participate in therapy. Patient demonstrates deficits in spinal mobility, posture, pain, L hip mobility, and L hip strength. Patient able to achieve coordinated TrA activation with UE counter press during today's session. Patient will benefit from continued skilled therapeutic intervention to address remaining deficits in spinal mobility, posture, pain, L hip mobility, and L hip strength in order to ensure safe and pain free caregiving, increase function, and improve overall QOL.      PT Long Term Goals - 06/30/19 1735      PT LONG TERM GOAL #1   Title  Patient will indicate a score of 62 or greater on FOTO (mODI) to demonstrate improved function.    Baseline  IE: 49; 06/30/2019:    Time  6    Period  Weeks    Status  New    Target Date  08/11/19      PT LONG TERM GOAL #2   Title  Patient will demonstrate improved coordination and pain free spinal mobility as evidenced by (-) L extension-rotation and (-) L lumbar  rotation with appropriate dissociation from hips.    Baseline  IE: (+) L extension-rotation and L lumbar rotation; 06/30/2019: (+) L extension-rotation, (-) L rotation    Time  6    Period  Weeks    Target Date  08/11/19      PT LONG TERM GOAL #3   Title  Patient will demonstrate improved pain-free L hip mobility as evidenced by a (-) FADDIR for improved ability to caregive for children.    Baseline  IE: (+) FADDIR, 06/30/2019: (-) L FADDIR, (+) R FADDIR    Time  6    Period  Weeks    Status  New    Target Date  08/11/19      PT LONG TERM GOAL #4   Title  Patient will demonstrate improved B  hamstring tissue extensibility as evidenced by 90-90 popliteal angle >160 degrees for improved ability to caregive for children.    Baseline  IE: B ~95 degrees; 06/30/2019: L 133 degrees, R 146 degrees    Time  6    Period  Weeks    Status  Revised    Target Date  08/11/19            Plan - 07/26/19 1324    Clinical Impression Statement  Patient presents to clinic with excellent motivation to participate in therapy. Patient demonstrates deficits in spinal mobility, posture, pain, L hip mobility, and L hip strength. Patient able to achieve coordinated TrA activation with UE counter press during today's session. Patient will benefit from continued skilled therapeutic intervention to address remaining deficits in spinal mobility, posture, pain, L hip mobility, and L hip strength in order to ensure safe and pain free caregiving, increase function, and improve overall QOL.    Personal Factors and Comorbidities  Age;Education;Sex;Behavior Pattern;Fitness;Time since onset of injury/illness/exacerbation;Past/Current Experience    Examination-Activity Limitations  Carry;Caring for Others;Bend;Locomotion Level;Stand;Lift;Bed Mobility;Squat;Stairs;Transfers;Sit;Sleep    Examination-Participation Restrictions  Laundry;Cleaning;Meal Prep;Interpersonal Relationship;Driving    Stability/Clinical Decision Making  Evolving/Moderate complexity    Rehab Potential  Fair    PT Frequency  1x / week    PT Duration  6 weeks    PT Treatment/Interventions  ADLs/Self Care Home Management;Aquatic Therapy;Cryotherapy;Moist Heat;Stair training;Gait training;Therapeutic exercise;Balance training;Patient/family education;Therapeutic activities;Neuromuscular re-education;Manual techniques;Dry needling;Spinal Manipulations;Joint Manipulations    PT Next Visit Plan  IAP basics, postural re-education    PT Home Exercise Plan  SI joint correction, pelvic tilts, L side stretch    Consulted and Agree with Plan of Care  Patient        Patient will benefit from skilled therapeutic intervention in order to improve the following deficits and impairments:  Abnormal gait, Decreased balance, Difficulty walking, Increased muscle spasms, Improper body mechanics, Decreased range of motion, Decreased activity tolerance, Postural dysfunction, Pain, Impaired flexibility, Hypermobility, Decreased strength  Visit Diagnosis: Abnormal posture  Low back pain, unspecified back pain laterality, unspecified chronicity, unspecified whether sciatica present  Muscle weakness (generalized)     Problem List Patient Active Problem List   Diagnosis Date Noted  . 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 History of preterm delivery 06/17/2018  . Maternal varicella, non-immune 11/11/2017  . History of heavy periods 01/02/2017  . Dysmenorrhea 01/02/2017  . Family history of endometriosis in first degree relative 01/02/2017  . Family history of first-degree relative with cardiomyopathy 04/16/2016   Myles Gip PT, DPT [phone] 07/26/2019, 1:25 PM   [facility] [address] [place], [place], [address] Phone: [phone]  Fax:  [phone]  Name: Bonnie Graham MRN: [id number] Date of Birth: Feb 18, 1996

## 2019-07-27 ENCOUNTER — Telehealth: Payer: Self-pay | Admitting: Certified Nurse Midwife

## 2019-07-27 ENCOUNTER — Ambulatory Visit (INDEPENDENT_AMBULATORY_CARE_PROVIDER_SITE_OTHER): Payer: Medicaid Other | Admitting: Certified Nurse Midwife

## 2019-07-27 DIAGNOSIS — O09212 Supervision of pregnancy with history of pre-term labor, second trimester: Secondary | ICD-10-CM | POA: Diagnosis not present

## 2019-07-27 DIAGNOSIS — Z3A22 22 weeks gestation of pregnancy: Secondary | ICD-10-CM

## 2019-07-27 DIAGNOSIS — Z79899 Other long term (current) drug therapy: Secondary | ICD-10-CM

## 2019-07-27 MED ORDER — HYDROXYPROGESTERONE CAPROATE 275 MG/1.1ML ~~LOC~~ SOAJ
275.0000 mg | Freq: Once | SUBCUTANEOUS | Status: AC
  Administered 2019-07-27: 275 mg via SUBCUTANEOUS

## 2019-07-27 NOTE — Telephone Encounter (Signed)
The patient called and stated that she was checking the status of the delivery for her makeena medication. Please advise.

## 2019-07-27 NOTE — Progress Notes (Signed)
Patient was seen for a Makena injection.

## 2019-07-27 NOTE — Telephone Encounter (Signed)
mychart message sent

## 2019-07-28 ENCOUNTER — Other Ambulatory Visit: Payer: Self-pay | Admitting: Obstetrics and Gynecology

## 2019-07-28 ENCOUNTER — Other Ambulatory Visit: Payer: Medicaid Other

## 2019-07-28 ENCOUNTER — Encounter: Payer: Self-pay | Admitting: *Deleted

## 2019-07-28 DIAGNOSIS — R600 Localized edema: Secondary | ICD-10-CM

## 2019-07-28 DIAGNOSIS — M5431 Sciatica, right side: Secondary | ICD-10-CM

## 2019-07-28 DIAGNOSIS — M5432 Sciatica, left side: Secondary | ICD-10-CM

## 2019-08-01 ENCOUNTER — Encounter: Payer: Self-pay | Admitting: Physical Therapy

## 2019-08-01 ENCOUNTER — Ambulatory Visit: Payer: Medicaid Other | Admitting: Physical Therapy

## 2019-08-01 ENCOUNTER — Other Ambulatory Visit: Payer: Self-pay

## 2019-08-01 DIAGNOSIS — M545 Low back pain, unspecified: Secondary | ICD-10-CM

## 2019-08-01 DIAGNOSIS — M6281 Muscle weakness (generalized): Secondary | ICD-10-CM

## 2019-08-01 DIAGNOSIS — R293 Abnormal posture: Secondary | ICD-10-CM | POA: Diagnosis not present

## 2019-08-01 NOTE — Therapy (Signed)
[place] [facility] [address] [place], [place], [address] Phone: [phone]   Fax:  [phone]  Physical Therapy Treatment  Patient Details  Name: Bonnie BernhardtHannah Grace Graham MRN: [id number] Date of Birth: 09/24/1996 Referring Provider (PT): Illene RegulusLawhorne, Michelle Jenkins, CNM   Encounter Date: 08/01/2019  PT End of Session - 08/01/19 1603    Visit Number  9    Number of Visits  10    Date for PT Re-Evaluation  08/11/19    PT Start Time  1557    PT Stop Time  1655    PT Time Calculation (min)  58 min    Activity Tolerance  Patient tolerated treatment well    Behavior During Therapy  [facility] for tasks assessed/performed       Past Medical History:  Diagnosis Date  . Heavy periods   . Painful menstrual periods     Past Surgical History:  Procedure Laterality Date  . NO PAST SURGERIES      There were no vitals filed for this visit.  Subjective Assessment - 08/01/19 1559    Subjective  Patient states that she has days where her back is bothering her and other days where it is good. She notes that if she is really active her back hurts more. Patient reports that she has been using pain medicine and rest when her back hurts.    Currently in Pain?  No/denies      TREATMENT  Manual Therapy: STM and TPR performed to B gluteals and sacrospinous and sacrotuberous ligaments to allow for decreased tension and pain and improved posture and function Mobilizations for decreased spasm and improved mobility, grade III, R and L innominates (L superior; R inferior) Sacral border mobilizations for decreased spasm, grade II (excessive motion available)  Neuromuscular Re-education: Donning and doffing of SIJ belt for improved stabilization. Patient educated on appropriate use. Seated B heel squeezes, x30  Seated B hip adduction squeezes x30  Patient educated throughout session on appropriate technique and form using multi-modal cueing, HEP, and activity  modification. Patient articulated understanding and returned demonstration.  Patient Response to interventions: No increase in pain.  ASSESSMENT Patient presents to clinic with excellent motivation to participate in therapy. Patient demonstrates deficits in spinal mobility, posture, pain, L hip mobility, and L hip strength. Patient able to get some relief with SIJ belt during today's session and responded positively to educational and active interventions. Patient will benefit from continued skilled therapeutic intervention to address remaining deficits in spinal mobility, posture, pain, L hip mobility, and L hip strength in order to ensure safe and pain free caregiving, increase function, and improve overall QOL.   PT Long Term Goals - 06/30/19 1735      PT LONG TERM GOAL #1   Title  Patient will indicate a score of 62 or greater on FOTO (mODI) to demonstrate improved function.    Baseline  IE: 49; 06/30/2019:    Time  6    Period  Weeks    Status  New    Target Date  08/11/19      PT LONG TERM GOAL #2   Title  Patient will demonstrate improved coordination and pain free spinal mobility as evidenced by (-) L extension-rotation and (-) L lumbar rotation with appropriate dissociation from hips.    Baseline  IE: (+) L extension-rotation and L lumbar rotation; 06/30/2019: (+) L extension-rotation, (-) L rotation    Time  6    Period  Weeks    Target Date  08/11/19      PT LONG TERM GOAL #3   Title  Patient will demonstrate improved pain-free L hip mobility as evidenced by a (-) FADDIR for improved ability to caregive for children.    Baseline  IE: (+) FADDIR, 06/30/2019: (-) L FADDIR, (+) R FADDIR    Time  6    Period  Weeks    Status  New    Target Date  08/11/19      PT LONG TERM GOAL #4   Title  Patient will demonstrate improved B hamstring tissue extensibility as evidenced by 90-90 popliteal angle >160 degrees for improved ability to caregive for children.    Baseline  IE: B ~95  degrees; 06/30/2019: L 133 degrees, R 146 degrees    Time  6    Period  Weeks    Status  Revised    Target Date  08/11/19            Plan - 08/01/19 1731    Clinical Impression Statement  Patient presents to clinic with excellent motivation to participate in therapy. Patient demonstrates deficits in spinal mobility, posture, pain, L hip mobility, and L hip strength. Patient able to get some relief with SIJ belt during today's session and responded positively to educational and active interventions. Patient will benefit from continued skilled therapeutic intervention to address remaining deficits in spinal mobility, posture, pain, L hip mobility, and L hip strength in order to ensure safe and pain free caregiving, increase function, and improve overall QOL.    Personal Factors and Comorbidities  Age;Education;Sex;Behavior Pattern;Fitness;Time since onset of injury/illness/exacerbation;Past/Current Experience    Examination-Activity Limitations  Carry;Caring for Others;Bend;Locomotion Level;Stand;Lift;Bed Mobility;Squat;Stairs;Transfers;Sit;Sleep    Examination-Participation Restrictions  Laundry;Cleaning;Meal Prep;Interpersonal Relationship;Driving    Stability/Clinical Decision Making  Evolving/Moderate complexity    Rehab Potential  Fair    PT Frequency  1x / week    PT Duration  6 weeks    PT Treatment/Interventions  ADLs/Self Care Home Management;Aquatic Therapy;Cryotherapy;Moist Heat;Stair training;Gait training;Therapeutic exercise;Balance training;Patient/family education;Therapeutic activities;Neuromuscular re-education;Manual techniques;Dry needling;Spinal Manipulations;Joint Manipulations    PT Next Visit Plan  IAP basics, postural re-education    PT Home Exercise Plan  SI joint correction, pelvic tilts, L side stretch    Consulted and Agree with Plan of Care  Patient       Patient will benefit from skilled therapeutic intervention in order to improve the following deficits and  impairments:  Abnormal gait, Decreased balance, Difficulty walking, Increased muscle spasms, Improper body mechanics, Decreased range of motion, Decreased activity tolerance, Postural dysfunction, Pain, Impaired flexibility, Hypermobility, Decreased strength  Visit Diagnosis: Abnormal posture  Low back pain, unspecified back pain laterality, unspecified chronicity, unspecified whether sciatica present  Muscle weakness (generalized)     Problem List Patient Active Problem List   Diagnosis Date Noted  . 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 History of preterm delivery 06/17/2018  . Maternal varicella, non-immune 11/11/2017  . History of heavy periods 01/02/2017  . Dysmenorrhea 01/02/2017  . Family history of endometriosis in first degree relative 01/02/2017  . Family history of first-degree relative with cardiomyopathy 04/16/2016   Myles Gip PT, DPT [phone] 08/01/2019, 5:41 PM  [facility] [address] [place], [place], [address] Phone: [phone]   Fax:  [phone]  Name: Bonnie Graham MRN: [id number] Date of Birth: Dec 12, 1995

## 2019-08-03 ENCOUNTER — Ambulatory Visit (INDEPENDENT_AMBULATORY_CARE_PROVIDER_SITE_OTHER): Payer: Medicaid Other | Admitting: Certified Nurse Midwife

## 2019-08-03 ENCOUNTER — Other Ambulatory Visit: Payer: Self-pay

## 2019-08-03 ENCOUNTER — Telehealth: Payer: Self-pay

## 2019-08-03 DIAGNOSIS — O09212 Supervision of pregnancy with history of pre-term labor, second trimester: Secondary | ICD-10-CM

## 2019-08-03 DIAGNOSIS — Z3A23 23 weeks gestation of pregnancy: Secondary | ICD-10-CM

## 2019-08-03 DIAGNOSIS — Z79899 Other long term (current) drug therapy: Secondary | ICD-10-CM

## 2019-08-03 MED ORDER — HYDROXYPROGESTERONE CAPROATE 275 MG/1.1ML ~~LOC~~ SOAJ
275.0000 mg | Freq: Once | SUBCUTANEOUS | Status: AC
  Administered 2019-08-03: 275 mg via SUBCUTANEOUS

## 2019-08-03 MED ORDER — HYDROXYPROGESTERONE CAPROATE 275 MG/1.1ML ~~LOC~~ SOAJ: 275.0000 mg | Freq: Once | SUBCUTANEOUS | Status: DC

## 2019-08-03 NOTE — Telephone Encounter (Signed)
Informed patient Bonnie Graham is in house. She will come in for inj today.

## 2019-08-05 ENCOUNTER — Ambulatory Visit: Payer: Medicaid Other | Admitting: Certified Nurse Midwife

## 2019-08-08 ENCOUNTER — Telehealth: Payer: Self-pay

## 2019-08-08 ENCOUNTER — Encounter: Payer: Medicaid Other | Admitting: Physical Therapy

## 2019-08-08 NOTE — Telephone Encounter (Signed)
mychart message sent

## 2019-08-10 ENCOUNTER — Encounter: Payer: Medicaid Other | Admitting: Certified Nurse Midwife

## 2019-08-10 ENCOUNTER — Other Ambulatory Visit: Payer: Self-pay

## 2019-08-10 ENCOUNTER — Ambulatory Visit (INDEPENDENT_AMBULATORY_CARE_PROVIDER_SITE_OTHER): Payer: Medicaid Other | Admitting: Certified Nurse Midwife

## 2019-08-10 VITALS — BP 121/73 | HR 99

## 2019-08-10 DIAGNOSIS — O09212 Supervision of pregnancy with history of pre-term labor, second trimester: Secondary | ICD-10-CM

## 2019-08-10 DIAGNOSIS — Z79899 Other long term (current) drug therapy: Secondary | ICD-10-CM

## 2019-08-10 DIAGNOSIS — Z3A24 24 weeks gestation of pregnancy: Secondary | ICD-10-CM | POA: Diagnosis not present

## 2019-08-10 MED ORDER — HYDROXYPROGESTERONE CAPROATE 275 MG/1.1ML ~~LOC~~ SOAJ
275.0000 mg | Freq: Once | SUBCUTANEOUS | Status: AC
  Administered 2019-08-10: 275 mg via SUBCUTANEOUS

## 2019-08-10 NOTE — Progress Notes (Signed)
Pt presents for #  6  inj 17P. No contractions or bleeding. Next inj due 1 week. C/o slight itching at site of injection x 2. Denies difficulty breathing, denies redness or swelling.

## 2019-08-15 ENCOUNTER — Other Ambulatory Visit: Payer: Self-pay

## 2019-08-15 ENCOUNTER — Ambulatory Visit: Payer: Medicaid Other | Attending: Certified Nurse Midwife | Admitting: Physical Therapy

## 2019-08-15 ENCOUNTER — Encounter: Payer: Self-pay | Admitting: Physical Therapy

## 2019-08-15 DIAGNOSIS — M545 Low back pain, unspecified: Secondary | ICD-10-CM

## 2019-08-15 DIAGNOSIS — M6281 Muscle weakness (generalized): Secondary | ICD-10-CM | POA: Insufficient documentation

## 2019-08-15 DIAGNOSIS — R293 Abnormal posture: Secondary | ICD-10-CM | POA: Diagnosis not present

## 2019-08-15 NOTE — Therapy (Signed)
[facility] [address] [place], [place], [address] Phone: [phone]   Fax:  [phone]  Physical Therapy Treatment  Patient Details  Name: Bonnie BernhardtHannah Grace Graham MRN: [id number] Date of Birth: 01/22/1996 Referring Provider (PT): Illene RegulusLawhorne, Michelle Jenkins, CNM   Encounter Date: 08/15/2019  PT End of Session - 08/15/19 1556    Visit Number  10    Number of Visits  18    Date for PT Re-Evaluation  10/10/19    PT Start Time  1558    PT Stop Time  1655    PT Time Calculation (min)  57 min    Activity Tolerance  Patient tolerated treatment well    Behavior During Therapy  [facility] for tasks assessed/performed       Past Medical History:  Diagnosis Date  . Heavy periods   . Painful menstrual periods     Past Surgical History:  Procedure Laterality Date  . NO PAST SURGERIES      There were no vitals filed for this visit.  Subjective Assessment - 08/15/19 1559    Subjective  Patient notes that she has been wearing SIJ belt and feels it has been helping. She has noticed some mild hip pain but attributes that to havign the belt on too tight. She feels supported by it on walks and when she is up doing stuff around the house.    Currently in Pain?  No/denies       TREATMENT  Neuromuscular Re-education: Reassessed goals; see below.  Sidelying diaphragmatic breathing with VCs and TCs for downregulation of the nervous system and improved management of IAP Sidelying, thoracolumbar rotations (open-book) bilaterally with diaphragmatic breathing for improved diaphragmatic and rib cage excursion. VCs and TCs to prevent compensations. Sidelying, PFM lengthening with inhalation. VCs and TCs to decrease compensatory patterns and encourage optimal lengthening of the PFM. sidelying, PFM contractions with exhalation. VCs and TCs to decrease compensatory patterns and encourage activation of the PFM. Seated PFM lengthening and lifting with towel roll  feedback and coordinated breath pattern for improved pelvic awareness. Standing Pilates Postural Control Hug a Tree, RTB, x10 Serve a Tray, RTB, x10  Patient educated throughout session on appropriate technique and form using multi-modal cueing, HEP, and activity modification. Patient articulated understanding and returned demonstration.  Patient Response to interventions: No increase in pain.  ASSESSMENT Patient presents to clinic with excellent motivation to participate in therapy. Patient demonstrates minimal remaining deficits in spinal mobility, posture, pain, L hip mobility, and L hip strength. Patient demonstrating good coordination of PFM with breath and management of IAP during today's session. Patient's condition has the potential to improve in response to therapy. Maximum improvement is yet to be obtained. The anticipated improvement is attainable and reasonable in a generally predictable time. Patient will benefit from continued skilled therapeutic intervention to address remaining deficits in spinal mobility, posture, pain, L hip mobility, and L hip strength in order to ensure safe and pain free caregiving, increase function, and improve overall QOL.   PT Long Term Goals - 08/15/19 1603      PT LONG TERM GOAL #1   Title  Patient will indicate a score of 62 or greater on FOTO (mODI) to demonstrate improved function.    Baseline  IE: 49; 08/15/2019: 59    Time  8    Period  Weeks    Status  On-going    Target Date  10/10/19      PT LONG TERM  GOAL #2   Title  Patient will demonstrate improved coordination and pain free spinal mobility as evidenced by (-) L extension-rotation and (-) L lumbar rotation with appropriate dissociation from hips.    Baseline  IE: (+) L extension-rotation and L lumbar rotation; 06/30/2019: (+) L extension-rotation, (-) L rotation; 08/15/2019: (-) L extension-rotation, (-) L rotation    Time  8    Period  Weeks    Status  Achieved    Target Date   10/10/19      PT LONG TERM GOAL #3   Title  Patient will demonstrate improved pain-free L hip mobility as evidenced by a (-) FADDIR for improved ability to caregive for children.    Baseline  IE: (+) FADDIR, 06/30/2019: (-) L FADDIR, (+) R FADDIR; 08/15/2019: (-) L FADDIR, (-) R FADDIR    Time  8    Period  Weeks    Status  Achieved    Target Date  10/10/19      PT LONG TERM GOAL #4   Title  Patient will demonstrate improved B hamstring tissue extensibility as evidenced by 90-90 popliteal angle >160 degrees for improved ability to caregive for children.    Baseline  IE: B ~95 degrees; 06/30/2019: L 133 degrees, R 146 degrees; 08/15/2019: L 136 degrees, R 151 degrees    Time  8    Period  Weeks    Status  On-going    Target Date  10/10/19            Plan - 08/15/19 1556    Clinical Impression Statement  Patient presents to clinic with excellent motivation to participate in therapy. Patient demonstrates minimal remaining deficits in spinal mobility, posture, pain, L hip mobility, and L hip strength. Patient demonstrating good coordination of PFM with breath and management of IAP during today's session. Patient's condition has the potential to improve in response to therapy. Maximum improvement is yet to be obtained. The anticipated improvement is attainable and reasonable in a generally predictable time. Patient will benefit from continued skilled therapeutic intervention to address remaining deficits in spinal mobility, posture, pain, L hip mobility, and L hip strength in order to ensure safe and pain free caregiving, increase function, and improve overall QOL.    Personal Factors and Comorbidities  Age;Education;Sex;Behavior Pattern;Fitness;Time since onset of injury/illness/exacerbation;Past/Current Experience    Examination-Activity Limitations  Carry;Caring for Others;Bend;Locomotion Level;Stand;Lift;Bed Mobility;Squat;Stairs;Transfers;Sit;Sleep    Examination-Participation Restrictions   Laundry;Cleaning;Meal Prep;Interpersonal Relationship;Driving    Stability/Clinical Decision Making  Evolving/Moderate complexity    Rehab Potential  Fair    PT Frequency  1x / week    PT Duration  8 weeks    PT Treatment/Interventions  ADLs/Self Care Home Management;Aquatic Therapy;Cryotherapy;Moist Heat;Stair training;Gait training;Therapeutic exercise;Balance training;Patient/family education;Therapeutic activities;Neuromuscular re-education;Manual techniques;Dry needling;Spinal Manipulations;Joint Manipulations    PT Next Visit Plan  IAP basics, postural re-education    PT Home Exercise Plan  SI joint correction, pelvic tilts, L side stretch    Consulted and Agree with Plan of Care  Patient       Patient will benefit from skilled therapeutic intervention in order to improve the following deficits and impairments:  Abnormal gait, Decreased balance, Difficulty walking, Increased muscle spasms, Improper body mechanics, Decreased range of motion, Decreased activity tolerance, Postural dysfunction, Pain, Impaired flexibility, Hypermobility, Decreased strength  Visit Diagnosis: Abnormal posture  Low back pain, unspecified back pain laterality, unspecified chronicity, unspecified whether sciatica present  Muscle weakness (generalized)     Problem List Patient Active Problem  List   Diagnosis Date Noted  . 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 History of preterm delivery 06/17/2018  . Maternal varicella, non-immune 11/11/2017  . History of heavy periods 01/02/2017  . Dysmenorrhea 01/02/2017  . Family history of endometriosis in first degree relative 01/02/2017  . Family history of first-degree relative with cardiomyopathy 04/16/2016   Myles Gip PT, DPT [phone] 08/15/2019, 5:21 PM  [place] [address] [place], [place], [address] Phone: [phone]   Fax:  [phone]  Name: Bonnie Graham MRN: [id number] Date of Birth:  31-May-1996

## 2019-08-18 ENCOUNTER — Ambulatory Visit (INDEPENDENT_AMBULATORY_CARE_PROVIDER_SITE_OTHER): Payer: Medicaid Other | Admitting: Certified Nurse Midwife

## 2019-08-18 ENCOUNTER — Other Ambulatory Visit: Payer: Self-pay

## 2019-08-18 VITALS — BP 105/66 | HR 97 | Wt 162.7 lb

## 2019-08-18 DIAGNOSIS — O09212 Supervision of pregnancy with history of pre-term labor, second trimester: Secondary | ICD-10-CM

## 2019-08-18 DIAGNOSIS — Z3A25 25 weeks gestation of pregnancy: Secondary | ICD-10-CM

## 2019-08-18 DIAGNOSIS — Z8751 Personal history of pre-term labor: Secondary | ICD-10-CM

## 2019-08-18 DIAGNOSIS — Z3492 Encounter for supervision of normal pregnancy, unspecified, second trimester: Secondary | ICD-10-CM

## 2019-08-18 DIAGNOSIS — Z113 Encounter for screening for infections with a predominantly sexual mode of transmission: Secondary | ICD-10-CM

## 2019-08-18 DIAGNOSIS — Z131 Encounter for screening for diabetes mellitus: Secondary | ICD-10-CM

## 2019-08-18 DIAGNOSIS — Z130 Encounter for screening for diseases of the blood and blood-forming organs and certain disorders involving the immune mechanism: Secondary | ICD-10-CM

## 2019-08-18 LAB — POCT URINALYSIS DIPSTICK OB
Bilirubin, UA: NEGATIVE
Blood, UA: NEGATIVE
Glucose, UA: NEGATIVE
Ketones, UA: NEGATIVE
Nitrite, UA: NEGATIVE
Spec Grav, UA: 1.025 (ref 1.010–1.025)
Urobilinogen, UA: 0.2 E.U./dL
pH, UA: 6 (ref 5.0–8.0)

## 2019-08-18 MED ORDER — HYDROXYPROGESTERONE CAPROATE 275 MG/1.1ML ~~LOC~~ SOAJ
275.0000 mg | Freq: Once | SUBCUTANEOUS | Status: AC
  Administered 2019-08-18: 275 mg via SUBCUTANEOUS

## 2019-08-18 NOTE — Patient Instructions (Signed)

## 2019-08-18 NOTE — Progress Notes (Signed)
ROB-Doing well, questions regarding supplement use in pregnancy. Makena given today, no adverse reactions noted. Anticipatory guidance regarding course of prenatal care. Reviewed red flag symptoms and when to call. RTC weekly for Makena injections. RTC x 4 weeks for 28 wk labs, TDaP, and ROB or sooner if needed

## 2019-08-18 NOTE — Progress Notes (Signed)
ROB-No complaints.  

## 2019-08-24 ENCOUNTER — Other Ambulatory Visit: Payer: Self-pay

## 2019-08-24 ENCOUNTER — Ambulatory Visit (INDEPENDENT_AMBULATORY_CARE_PROVIDER_SITE_OTHER): Payer: Medicaid Other | Admitting: Certified Nurse Midwife

## 2019-08-24 VITALS — BP 95/63 | HR 85 | Wt 164.1 lb

## 2019-08-24 DIAGNOSIS — O09212 Supervision of pregnancy with history of pre-term labor, second trimester: Secondary | ICD-10-CM

## 2019-08-24 DIAGNOSIS — Z3A26 26 weeks gestation of pregnancy: Secondary | ICD-10-CM | POA: Diagnosis not present

## 2019-08-24 DIAGNOSIS — Z79899 Other long term (current) drug therapy: Secondary | ICD-10-CM

## 2019-08-24 MED ORDER — HYDROXYPROGESTERONE CAPROATE 275 MG/1.1ML ~~LOC~~ SOAJ
275.0000 mg | Freq: Once | SUBCUTANEOUS | Status: AC
  Administered 2019-08-24: 275 mg via SUBCUTANEOUS

## 2019-08-24 NOTE — Progress Notes (Signed)
Pt presents for #   8  inj 17P. No contractions or bleeding. Next inj due 1 week. Patient will reorder Makena.

## 2019-08-27 ENCOUNTER — Encounter: Payer: Self-pay | Admitting: Certified Nurse Midwife

## 2019-08-29 ENCOUNTER — Ambulatory Visit: Payer: Medicaid Other | Admitting: Physical Therapy

## 2019-08-29 ENCOUNTER — Other Ambulatory Visit: Payer: Self-pay

## 2019-08-29 ENCOUNTER — Encounter: Payer: Self-pay | Admitting: Physical Therapy

## 2019-08-29 DIAGNOSIS — R293 Abnormal posture: Secondary | ICD-10-CM

## 2019-08-29 DIAGNOSIS — M545 Low back pain, unspecified: Secondary | ICD-10-CM

## 2019-08-29 DIAGNOSIS — M6281 Muscle weakness (generalized): Secondary | ICD-10-CM

## 2019-08-29 NOTE — Therapy (Signed)
[place] [facility] [address] [place], [place], [address] Phone: [phone]   Fax:  [phone]  Physical Therapy Treatment  Patient Details  Name: Bonnie Graham MRN: [id number] Date of Birth: 1996-02-06 Referring Provider (PT): Illene Regulus, CNM   Encounter Date: 08/29/2019  PT End of Session - 08/29/19 1606    Visit Number  11    Number of Visits  18    Date for PT Re-Evaluation  10/10/19    PT Start Time  1603    PT Stop Time  1634    PT Time Calculation (min)  31 min    Activity Tolerance  Patient tolerated treatment well    Behavior During Therapy  [facility] for tasks assessed/performed       Past Medical History:  Diagnosis Date  . Heavy periods   . Painful menstrual periods     Past Surgical History:  Procedure Laterality Date  . NO PAST SURGERIES      There were no vitals filed for this visit.  Subjective Assessment - 08/29/19 1606    Subjective  Patient notes that she is doing well and has not had any increased pain/discomofrt since last visit. Patient notes that she has been a little less consistent with her HEP but notes she always does her breathing exercises. She reports high level of confidence of navigating the rest of her pregnancy with self-management tools learned in physical therapy.    Currently in Pain?  No/denies       TREATMENT  Neuromuscular Re-education: Reassessed FOTO.  Patient education on safe breathing practices during delivery, as well as body positioning/mechanics during delivery. Patient encouraged to speak to care team regarding position options for delivery. Seated diaphragmatic breathing with VCs and TCs for downregulation of the nervous system and improved management of IAP Seated abdominal rehabilitation exercises for preparation for delivery/recovery after delivery:  Pelvic tilts with coordinated breath   "Hut" exercise for TrA activation   Pelvic neutral march with  breath coordination  Patient educated throughout session on appropriate technique and form using multi-modal cueing, HEP, and activity modification. Patient articulated understanding and returned demonstration.  Patient Response to interventions: No increase in pain.  ASSESSMENT Patient presents to clinic with excellent motivation to participate in therapy and sustained therapeutic gains. Patient demonstrates minimal remaining deficits in spinal mobility, posture, pain, L hip mobility, and L hip strength. Patient demonstrating good self-management skills and strategies with sound understanding of exercise application to various scenarios she might encounter after delivery. Patient will benefit from a trial period of self-management with the option to continue skilled therapeutic intervention to address any/all remaining deficits in spinal mobility, posture, pain, L hip mobility, and L hip strength in order to ensure safe and pain free caregiving, increase function, and improve overall QOL.     PT Long Term Goals - 08/29/19 1822      PT LONG TERM GOAL #1   Title  Patient will indicate a score of 62 or greater on FOTO (mODI) to demonstrate improved function.    Baseline  IE: 49; 08/15/2019: 59; 10/19: 65    Time  8    Period  Weeks    Status  Achieved      PT LONG TERM GOAL #2   Title  Patient will demonstrate improved coordination and pain free spinal mobility as evidenced by (-) L extension-rotation and (-) L lumbar rotation with appropriate dissociation from hips.    Baseline  IE: (+) L extension-rotation and L lumbar rotation; 06/30/2019: (+) L extension-rotation, (-) L rotation; 08/15/2019: (-) L extension-rotation, (-) L rotation    Time  8    Period  Weeks    Status  Achieved      PT LONG TERM GOAL #3   Title  Patient will demonstrate improved pain-free L hip mobility as evidenced by a (-) FADDIR for improved ability to caregive for children.    Baseline  IE: (+) FADDIR, 06/30/2019:  (-) L FADDIR, (+) R FADDIR; 08/15/2019: (-) L FADDIR, (-) R FADDIR    Time  8    Period  Weeks    Status  Achieved      PT LONG TERM GOAL #4   Title  Patient will demonstrate improved B hamstring tissue extensibility as evidenced by 90-90 popliteal angle >160 degrees for improved ability to caregive for children.    Baseline  IE: B ~95 degrees; 06/30/2019: L 133 degrees, R 146 degrees; 08/15/2019: L 136 degrees, R 151 degrees    Time  8    Period  Weeks    Status  On-going    Target Date  10/10/19            Plan - 08/29/19 1612    Clinical Impression Statement  Patient presents to clinic with excellent motivation to participate in therapy and sustained therapeutic gains. Patient demonstrates minimal remaining deficits in spinal mobility, posture, pain, L hip mobility, and L hip strength. Patient demonstrating good self-management skills and strategies with sound understanding of exercise application to various scenarios she might encounter after delivery. Patient will benefit from a trial period of self-management with the option to continue skilled therapeutic intervention to address any/all remaining deficits in spinal mobility, posture, pain, L hip mobility, and L hip strength in order to ensure safe and pain free caregiving, increase function, and improve overall QOL.    Personal Factors and Comorbidities  Age;Education;Sex;Behavior Pattern;Fitness;Time since onset of injury/illness/exacerbation;Past/Current Experience    Examination-Activity Limitations  Carry;Caring for Others;Bend;Locomotion Level;Stand;Lift;Bed Mobility;Squat;Stairs;Transfers;Sit;Sleep    Examination-Participation Restrictions  Laundry;Cleaning;Meal Prep;Interpersonal Relationship;Driving    Stability/Clinical Decision Making  Evolving/Moderate complexity    Rehab Potential  Fair    PT Frequency  1x / week    PT Duration  8 weeks    PT Treatment/Interventions  ADLs/Self Care Home Management;Aquatic  Therapy;Cryotherapy;Moist Heat;Stair training;Gait training;Therapeutic exercise;Balance training;Patient/family education;Therapeutic activities;Neuromuscular re-education;Manual techniques;Dry needling;Spinal Manipulations;Joint Manipulations    PT Next Visit Plan  IAP basics, postural re-education    PT Home Exercise Plan  SI joint correction, pelvic tilts, L side stretch    Consulted and Agree with Plan of Care  Patient       Patient will benefit from skilled therapeutic intervention in order to improve the following deficits and impairments:  Abnormal gait, Decreased balance, Difficulty walking, Increased muscle spasms, Improper body mechanics, Decreased range of motion, Decreased activity tolerance, Postural dysfunction, Pain, Impaired flexibility, Hypermobility, Decreased strength  Visit Diagnosis: Abnormal posture  Low back pain, unspecified back pain laterality, unspecified chronicity, unspecified whether sciatica present  Muscle weakness (generalized)     Problem List Patient Active Problem List   Diagnosis Date Noted  . 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 History of preterm delivery 06/17/2018  . Maternal varicella, non-immune 11/11/2017  . History of heavy periods 01/02/2017  . Dysmenorrhea 01/02/2017  . Family history of endometriosis in first degree relative 01/02/2017  . Family history of first-degree relative with cardiomyopathy 04/16/2016   Sheria LangKatlin Nyan Dufresne PT,  DPT #[id number] 08/29/2019, 6:28 PM  [facility] [address] [place], [place], [address] Phone: [phone]   Fax:  [phone]  Name: Bonnie Graham MRN: [id number] Date of Birth: July 27, 1996

## 2019-08-31 ENCOUNTER — Ambulatory Visit: Payer: Medicaid Other

## 2019-09-01 ENCOUNTER — Telehealth: Payer: Self-pay

## 2019-09-01 ENCOUNTER — Other Ambulatory Visit: Payer: Self-pay

## 2019-09-01 ENCOUNTER — Ambulatory Visit: Payer: Medicaid Other

## 2019-09-01 ENCOUNTER — Encounter: Payer: Self-pay | Admitting: Certified Nurse Midwife

## 2019-09-01 NOTE — Telephone Encounter (Signed)
Spoke to Fulton from [employer] for [facility], she stated that the medication by there records were shipped and delivered at 10:36 am on 08/31/19. I informed Estill Bamberg that the medication was not in the office. Address for the office was correct but it was missing the [address].

## 2019-09-02 ENCOUNTER — Telehealth: Payer: Self-pay

## 2019-09-02 NOTE — Telephone Encounter (Signed)
Patient called to check Makena delivery status.  Advised patient we have looked in our office and Makena has not been found.  Patient aware I will call Makena to see what the next step is and she verbalized understanding.

## 2019-09-05 ENCOUNTER — Telehealth: Payer: Self-pay

## 2019-09-05 NOTE — Telephone Encounter (Signed)
Pt called requesting provider call her tomorrow to confirm receipt of delivery of [facility] from [facility].

## 2019-09-05 NOTE — Telephone Encounter (Signed)
[facility]ill Bamberg [facility] in [place] called to see if package for pt has been located [phone].

## 2019-09-05 NOTE — Telephone Encounter (Signed)
Bonnie [facility] with [facility] called to let us know that [employer] driver will have to come and see where package was left.  In the mean time pharmacy will send Makena again and we should receive it tomorrow.

## 2019-09-05 NOTE — Telephone Encounter (Signed)
Called and spoke with [facility]ill Bamberg from [facility].  Estill Bamberg called [employer] and was told package was left at the front door and driver signed for package.  Estill Bamberg is waiting for a return call from [facility] and will let us know if we need to do anything else.

## 2019-09-07 ENCOUNTER — Other Ambulatory Visit: Payer: Self-pay

## 2019-09-07 ENCOUNTER — Ambulatory Visit: Payer: Medicaid Other | Admitting: Certified Nurse Midwife

## 2019-09-07 ENCOUNTER — Encounter: Payer: Self-pay | Admitting: Certified Nurse Midwife

## 2019-09-07 VITALS — BP 98/54 | HR 80 | Ht 63.0 in | Wt 164.4 lb

## 2019-09-07 DIAGNOSIS — Z79899 Other long term (current) drug therapy: Secondary | ICD-10-CM

## 2019-09-07 MED ORDER — HYDROXYPROGESTERONE CAPROATE 275 MG/1.1ML ~~LOC~~ SOAJ
275.0000 mg | Freq: Once | SUBCUTANEOUS | Status: AC
  Administered 2019-09-07: 275 mg via SUBCUTANEOUS

## 2019-09-07 NOTE — Progress Notes (Signed)
Pt presents for #  9   inj 17P. No contractions or bleeding. Next inj due 1 week. Patient missed a week of 17P due to medicine not being delivered to our office in time.

## 2019-09-12 ENCOUNTER — Encounter: Payer: Medicaid Other | Admitting: Physical Therapy

## 2019-09-14 ENCOUNTER — Ambulatory Visit (INDEPENDENT_AMBULATORY_CARE_PROVIDER_SITE_OTHER): Payer: Medicaid Other | Admitting: Certified Nurse Midwife

## 2019-09-14 ENCOUNTER — Other Ambulatory Visit: Payer: Medicaid Other

## 2019-09-14 ENCOUNTER — Other Ambulatory Visit: Payer: Self-pay

## 2019-09-14 ENCOUNTER — Encounter: Payer: Self-pay | Admitting: Certified Nurse Midwife

## 2019-09-14 VITALS — BP 109/71 | HR 84 | Wt 166.4 lb

## 2019-09-14 DIAGNOSIS — Z113 Encounter for screening for infections with a predominantly sexual mode of transmission: Secondary | ICD-10-CM

## 2019-09-14 DIAGNOSIS — Z3492 Encounter for supervision of normal pregnancy, unspecified, second trimester: Secondary | ICD-10-CM

## 2019-09-14 DIAGNOSIS — Z131 Encounter for screening for diabetes mellitus: Secondary | ICD-10-CM

## 2019-09-14 DIAGNOSIS — Z130 Encounter for screening for diseases of the blood and blood-forming organs and certain disorders involving the immune mechanism: Secondary | ICD-10-CM

## 2019-09-14 LAB — POCT URINALYSIS DIPSTICK OB
Bilirubin, UA: NEGATIVE
Glucose, UA: NEGATIVE
Ketones, UA: NEGATIVE
Leukocytes, UA: NEGATIVE
Nitrite, UA: NEGATIVE
POC,PROTEIN,UA: NEGATIVE
Spec Grav, UA: 1.015 (ref 1.010–1.025)
Urobilinogen, UA: 0.2 E.U./dL
pH, UA: 6 (ref 5.0–8.0)

## 2019-09-14 MED ORDER — HYDROXYPROGESTERONE CAPROATE 275 MG/1.1ML ~~LOC~~ SOAJ
275.0000 mg | Freq: Once | SUBCUTANEOUS | Status: AC
  Administered 2019-09-14: 275 mg via SUBCUTANEOUS

## 2019-09-14 MED ORDER — TETANUS-DIPHTH-ACELL PERTUSSIS 5-2.5-18.5 LF-MCG/0.5 IM SUSP
0.5000 mL | Freq: Once | INTRAMUSCULAR | Status: AC
  Administered 2019-09-14: 0.5 mL via INTRAMUSCULAR

## 2019-09-14 NOTE — Progress Notes (Signed)
ROB doing well. Feels good movement. Glucose screen/RPR/CC/TDap/BTC/17P today. Discussed birth control after baby and birth plan (sample given). All questions answered. Follow up 1 wk for 17 P and 2 wks for ROB.   Philip Aspen, CNM

## 2019-09-14 NOTE — Patient Instructions (Signed)
Td Vaccine (Tetanus and Diphtheria): What You Need to Know 1. Why get vaccinated? Tetanus  and diphtheria are very serious diseases. They are rare in the [place] today, but people who do become infected often have severe complications. Td vaccine is used to protect adolescents and adults from both of these diseases. Both tetanus and diphtheria are infections caused by bacteria. Diphtheria spreads from person to person through coughing or sneezing. Tetanus-causing bacteria enter the body through cuts, scratches, or wounds. TETANUS (Lockjaw) causes painful muscle tightening and stiffness, usually all over the body.  It can lead to tightening of muscles in the head and neck so you can't open your mouth, swallow, or sometimes even breathe. Tetanus kills about 1 out of every 10 people who are infected even after receiving the best medical care. DIPHTHERIA can cause a thick coating to form in the back of the throat.  It can lead to breathing problems, paralysis, heart failure, and death. Before vaccines, as many as 200,000 cases of diphtheria and hundreds of cases of tetanus were reported in the [place] each year. Since vaccination began, reports of cases for both diseases have dropped by about 99%. 2. Td vaccine Td vaccine can protect adolescents and adults from tetanus and diphtheria. Td is usually given as a booster dose every 10 years but it can also be given earlier after a severe and dirty wound or burn. Another vaccine, called Tdap, which protects against pertussis in addition to tetanus and diphtheria, is sometimes recommended instead of Td vaccine. Your doctor or the person giving you the vaccine can give you more information. Td may safely be given at the same time as other vaccines. 3. Some people should not get this vaccine  A person who has ever had a life-threatening allergic reaction after a previous dose of any tetanus or diphtheria containing vaccine, OR has a severe allergy  to any part of this vaccine, should not get Td vaccine. Tell the person giving the vaccine about any severe allergies.  Talk to your doctor if you: ? had severe pain or swelling after any vaccine containing diphtheria or tetanus, ? ever had a condition called Guillain Barr Syndrome (GBS), ? aren't feeling well on the day the shot is scheduled. 4. Risks of a vaccine reaction With any medicine, including vaccines, there is a chance of side effects. These are usually mild and go away on their own. Serious reactions are also possible but are rare. Most people who get Td vaccine do not have any problems with it. Mild Problems following Td vaccine: (Did not interfere with activities)  Pain where the shot was given (about 8 people in 10)  Redness or swelling where the shot was given (about 1 person in 4)  Mild fever (rare)  Headache (about 1 person in 4)  Tiredness (about 1 person in 4) Moderate Problems following Td vaccine: (Interfered with activities, but did not require medical attention)  Fever over 102F (rare) Severe Problems following Td vaccine: (Unable to perform usual activities; required medical attention)  Swelling, severe pain, bleeding and/or redness in the arm where the shot was given (rare). Problems that could happen after any vaccine:  People sometimes faint after a medical procedure, including vaccination. Sitting or lying down for about 15 minutes can help prevent fainting, and injuries caused by a fall. Tell your doctor if you feel dizzy, or have vision changes or ringing in the ears.  Some people get severe pain in the shoulder and have   difficulty moving the arm where a shot was given. This happens very rarely.  Any medication can cause a severe allergic reaction. Such reactions from a vaccine are very rare, estimated at fewer than 1 in a million doses, and would happen within a few minutes to a few hours after the vaccination. As with any medicine, there is a  very remote chance of a vaccine causing a serious injury or death. The safety of vaccines is always being monitored. For more information, visit: www.cdc.gov/vaccinesafety/ 5. What if there is a serious reaction? What should I look for?  Look for anything that concerns you, such as signs of a severe allergic reaction, very high fever, or unusual behavior. Signs of a severe allergic reaction can include hives, swelling of the face and throat, difficulty breathing, a fast heartbeat, dizziness, and weakness. These would usually start a few minutes to a few hours after the vaccination. What should I do?  If you think it is a severe allergic reaction or other emergency that can't wait, call 9-1-1 or get the person to the nearest hospital. Otherwise, call your doctor.  Afterward, the reaction should be reported to the Vaccine Adverse Event Reporting System (VAERS). Your doctor might file this report, or you can do it yourself through the VAERS web site at www.vaers.hhs.gov, or by calling 1-800-822-7967. VAERS does not give medical advice. 6. The National Vaccine Injury Compensation Program The National Vaccine Injury Compensation Program (VICP) is a federal program that was created to compensate people who may have been injured by certain vaccines. Persons who believe they may have been injured by a vaccine can learn about the program and about filing a claim by calling 1-800-338-2382 or visiting the VICP website at www.hrsa.gov/vaccinecompensation. There is a time limit to file a claim for compensation. 7. How can I learn more?  Ask your doctor. He or she can give you the vaccine package insert or suggest other sources of information.  Call your local or state health department.  Contact the Centers for Disease Control and Prevention (CDC): ? Call 1-800-232-4636 (1-800-CDC-INFO) ? Visit CDC's website at www.cdc.gov/vaccines Vaccine Information Statement Td Vaccine (02/19/16) This information is  not intended to replace advice given to you by your health care provider. Make sure you discuss any questions you have with your health care provider. Document Released: 08/24/2006 Document Revised: 06/14/2018 Document Reviewed: 06/14/2018 Elsevier Interactive Patient Education  2020 Elsevier Inc. Glucose Tolerance Test During Pregnancy Why am I having this test? The glucose tolerance test (GTT) is done to check how your body processes sugar (glucose). This is one of several tests used to diagnose diabetes that develops during pregnancy (gestational diabetes mellitus). Gestational diabetes is a temporary form of diabetes that some women develop during pregnancy. It usually occurs during the second trimester of pregnancy and goes away after delivery. Testing (screening) for gestational diabetes usually occurs between 24 and 28 weeks of pregnancy. You may have the GTT test after having a 1-hour glucose screening test if the results from that test indicate that you may have gestational diabetes. You may also have this test if:  You have a history of gestational diabetes.  You have a history of giving birth to very large babies or have experienced repeated fetal loss (stillbirth).  You have signs and symptoms of diabetes, such as: ? Changes in your vision. ? Tingling or numbness in your hands or feet. ? Changes in hunger, thirst, and urination that are not otherwise explained by your pregnancy.   What is being tested? This test measures the amount of glucose in your blood at different times during a period of 3 hours. This indicates how well your body is able to process glucose. What kind of sample is taken?  Blood samples are required for this test. They are usually collected by inserting a needle into a blood vessel. How do I prepare for this test?  For 3 days before your test, eat normally. Have plenty of carbohydrate-rich foods.  Follow instructions from your health care provider  about: ? Eating or drinking restrictions on the day of the test. You may be asked to not eat or drink anything other than water (fast) starting 8-10 hours before the test. ? Changing or stopping your regular medicines. Some medicines may interfere with this test. Tell a health care provider about:  All medicines you are taking, including vitamins, herbs, eye drops, creams, and over-the-counter medicines.  Any blood disorders you have.  Any surgeries you have had.  Any medical conditions you have. What happens during the test? First, your blood glucose will be measured. This is referred to as your fasting blood glucose, since you fasted before the test. Then, you will drink a glucose solution that contains a certain amount of glucose. Your blood glucose will be measured again 1, 2, and 3 hours after drinking the solution. This test takes about 3 hours to complete. You will need to stay at the testing location during this time. During the testing period:  Do not eat or drink anything other than the glucose solution.  Do not exercise.  Do not use any products that contain nicotine or tobacco, such as cigarettes and e-cigarettes. If you need help stopping, ask your health care provider. The testing procedure may vary among health care providers and hospitals. How are the results reported? Your results will be reported as milligrams of glucose per deciliter of blood (mg/dL) or millimoles per liter (mmol/L). Your health care provider will compare your results to normal ranges that were established after testing a large group of people (reference ranges). Reference ranges may vary among labs and hospitals. For this test, common reference ranges are:  Fasting: less than 95-105 mg/dL (5.3-5.8 mmol/L).  1 hour after drinking glucose: less than 180-190 mg/dL (10.0-10.5 mmol/L).  2 hours after drinking glucose: less than 155-165 mg/dL (8.6-9.2 mmol/L).  3 hours after drinking glucose: 140-145  mg/dL (7.8-8.1 mmol/L). What do the results mean? Results within reference ranges are considered normal, meaning that your glucose levels are well-controlled. If two or more of your blood glucose levels are high, you may be diagnosed with gestational diabetes. If only one level is high, your health care provider may suggest repeat testing or other tests to confirm a diagnosis. Talk with your health care provider about what your results mean. Questions to ask your health care provider Ask your health care provider, or the department that is doing the test:  When will my results be ready?  How will I get my results?  What are my treatment options?  What other tests do I need?  What are my next steps? Summary  The glucose tolerance test (GTT) is one of several tests used to diagnose diabetes that develops during pregnancy (gestational diabetes mellitus). Gestational diabetes is a temporary form of diabetes that some women develop during pregnancy.  You may have the GTT test after having a 1-hour glucose screening test if the results from that test indicate that you may have gestational diabetes.   You may also have this test if you have any symptoms or risk factors for gestational diabetes.  Talk with your health care provider about what your results mean. This information is not intended to replace advice given to you by your health care provider. Make sure you discuss any questions you have with your health care provider. Document Released: 04/27/2012 Document Revised: 02/17/2019 Document Reviewed: 06/08/2017 Elsevier Patient Education  2020 Elsevier Inc.  

## 2019-09-15 LAB — CBC
Hematocrit: 33.2 % — ABNORMAL LOW (ref 34.0–46.6)
Hemoglobin: 11.2 g/dL (ref 11.1–15.9)
MCH: 30.2 pg (ref 26.6–33.0)
MCHC: 33.7 g/dL (ref 31.5–35.7)
MCV: 90 fL (ref 79–97)
Platelets: 321 10*3/uL (ref 150–450)
RBC: 3.71 x10E6/uL — ABNORMAL LOW (ref 3.77–5.28)
RDW: 13.4 % (ref 11.7–15.4)
WBC: 15.6 10*3/uL — ABNORMAL HIGH (ref 3.4–10.8)

## 2019-09-15 LAB — SYPHILIS: RPR W/REFLEX TO RPR TITER AND TREPONEMAL ANTIBODIES, TRADITIONAL SCREENING AND DIAGNOSIS ALGORITHM: RPR Ser Ql: NONREACTIVE

## 2019-09-15 LAB — GLUCOSE, 1 HOUR GESTATIONAL: Gestational Diabetes Screen: 91 mg/dL (ref 65–139)

## 2019-09-18 ENCOUNTER — Encounter: Payer: Self-pay | Admitting: Certified Nurse Midwife

## 2019-09-22 ENCOUNTER — Other Ambulatory Visit: Payer: Self-pay

## 2019-09-22 ENCOUNTER — Ambulatory Visit (INDEPENDENT_AMBULATORY_CARE_PROVIDER_SITE_OTHER): Payer: Medicaid Other | Admitting: Certified Nurse Midwife

## 2019-09-22 VITALS — BP 106/75 | HR 97 | Wt 167.3 lb

## 2019-09-22 DIAGNOSIS — Z8751 Personal history of pre-term labor: Secondary | ICD-10-CM

## 2019-09-22 DIAGNOSIS — O09213 Supervision of pregnancy with history of pre-term labor, third trimester: Secondary | ICD-10-CM

## 2019-09-22 DIAGNOSIS — Z3A29 29 weeks gestation of pregnancy: Secondary | ICD-10-CM

## 2019-09-22 MED ORDER — HYDROXYPROGESTERONE CAPROATE 275 MG/1.1ML ~~LOC~~ SOAJ
275.0000 mg | Freq: Once | SUBCUTANEOUS | Status: AC
  Administered 2019-09-22: 275 mg via SUBCUTANEOUS

## 2019-09-22 NOTE — Progress Notes (Signed)
Pt presents for #  9   inj 17P. No contractions or bleeding. Next inj due 1 week. Pt denies any leaking of fluid and other signs of preterm labor.   BP 106/75   Pulse 97   Wt 167 lb 4.8 oz (75.9 kg)   LMP 02/28/2019   BMI 29.64 kg/m

## 2019-09-26 ENCOUNTER — Encounter: Payer: Medicaid Other | Admitting: Physical Therapy

## 2019-09-29 ENCOUNTER — Other Ambulatory Visit: Payer: Self-pay

## 2019-09-29 ENCOUNTER — Ambulatory Visit (INDEPENDENT_AMBULATORY_CARE_PROVIDER_SITE_OTHER): Payer: Medicaid Other | Admitting: Certified Nurse Midwife

## 2019-09-29 DIAGNOSIS — O09213 Supervision of pregnancy with history of pre-term labor, third trimester: Secondary | ICD-10-CM

## 2019-09-29 DIAGNOSIS — Z3A30 30 weeks gestation of pregnancy: Secondary | ICD-10-CM

## 2019-09-29 DIAGNOSIS — Z8751 Personal history of pre-term labor: Secondary | ICD-10-CM

## 2019-09-29 MED ORDER — HYDROXYPROGESTERONE CAPROATE 275 MG/1.1ML ~~LOC~~ SOAJ
275.0000 mg | Freq: Once | SUBCUTANEOUS | Status: AC
  Administered 2019-09-29: 275 mg via SUBCUTANEOUS

## 2019-09-29 NOTE — Progress Notes (Signed)
I have reviewed the record and concur with patient management and plan of care.    Diona Fanti, CNM [facility], [facility] 09/29/19 5:10 PM

## 2019-09-29 NOTE — Patient Instructions (Signed)
Hydroxyprogesterone caproate injection for pregnancy What is this medicine? HYDROXYPROGESTERONE (hye drox ee proe JES ter one) is a female hormone. This medicine is used in women who are pregnant and who have delivered a baby too early (preterm) in the past. It helps lower the risk of having a preterm baby again. This medicine may be used for other purposes; ask your health care provider or pharmacist if you have questions. COMMON BRAND NAME(S): Makena What should I tell my health care provider before I take this medicine? They need to know if you have any of these conditions:  breast, cervical, uterine, or vaginal cancer  depression  diabetes or prediabetes  heart disease  high blood pressure  history of blood clots  kidney disease  liver disease  lung or breathing disease, like asthma  migraine headaches  seizures  vaginal bleeding  an unusual or allergic reaction to hydroxyprogesterone, other hormones, castor oil, benzyl alcohol, other medicines, foods, dyes, or preservatives  breast-feeding How should I use this medicine? This medicine is for injection into a muscle or under the skin. You will receive an injection once every week (every 7 days) as directed during your pregnancy. It is given by a health care professional in a hospital or clinic setting. Talk to your pediatrician regarding the use of this medicine in children. While this drug may be prescribed for pregnant women as young as 16 years, precautions do apply. Overdosage: If you think you have taken too much of this medicine contact a poison control center or emergency room at once. NOTE: This medicine is only for you. Do not share this medicine with others. What if I miss a dose? It is important not to miss your dose. Call your doctor or health care professional if you are unable to keep an appointment. What may interact with this medicine? Significant interactions are not expected. This list may not  describe all possible interactions. Give your health care provider a list of all the medicines, herbs, non-prescription drugs, or dietary supplements you use. Also tell them if you smoke, drink alcohol, or use illegal drugs. Some items may interact with your medicine. What should I watch for while using this medicine? Your pregnancy will be monitored carefully while you are receiving this medicine. What side effects may I notice from receiving this medicine? Side effects that you should report to your doctor or health care professional as soon as possible:  allergic reactions like skin rash, itching or hives, swelling of the face, lips, or tongue  breathing problems  depressed mood  increase in blood pressure  increased hunger or thirst  increased urination  signs and symptoms of a blood clot such as breathing problems; changes in vision; chest pain; severe, sudden headache; pain, swelling, warmth in the leg; trouble speaking; sudden numbness or weakness of the face, arm or leg  unusually weak or tired  unusual vaginal bleeding  yellowing of the eyes or skin Side effects that usually do not require medical attention (report to your doctor or health care professional if they continue or are bothersome):  diarrhea  fluid retention and swelling  nausea  pain, redness, or irritation at site where injected This list may not describe all possible side effects. Call your doctor for medical advice about side effects. You may report side effects to FDA at 1-800-FDA-1088. Where should I keep my medicine? This drug is given in a hospital or clinic and will not be stored at home. NOTE: This sheet is a   summary. It may not cover all possible information. If you have questions about this medicine, talk to your doctor, pharmacist, or health care provider.  2020 Elsevier/Gold Standard (2017-07-12 11:14:47)   

## 2019-09-29 NOTE — Progress Notes (Signed)
Pt presents for #   10  inj 17P. No contractions or bleeding. Next inj due 1 week. 

## 2019-09-30 ENCOUNTER — Encounter: Payer: Medicaid Other | Admitting: Obstetrics and Gynecology

## 2019-10-04 ENCOUNTER — Other Ambulatory Visit: Payer: Self-pay

## 2019-10-04 ENCOUNTER — Ambulatory Visit (INDEPENDENT_AMBULATORY_CARE_PROVIDER_SITE_OTHER): Payer: Medicaid Other | Admitting: Certified Nurse Midwife

## 2019-10-04 VITALS — BP 108/66 | HR 114 | Wt 169.4 lb

## 2019-10-04 DIAGNOSIS — Z3492 Encounter for supervision of normal pregnancy, unspecified, second trimester: Secondary | ICD-10-CM

## 2019-10-04 DIAGNOSIS — Z1389 Encounter for screening for other disorder: Secondary | ICD-10-CM | POA: Diagnosis not present

## 2019-10-04 LAB — POCT URINALYSIS DIPSTICK OB
Bilirubin, UA: NEGATIVE
Blood, UA: NEGATIVE
Glucose, UA: NEGATIVE
Ketones, UA: NEGATIVE
Leukocytes, UA: NEGATIVE
Nitrite, UA: NEGATIVE
POC,PROTEIN,UA: NEGATIVE
Spec Grav, UA: 1.02 (ref 1.010–1.025)
Urobilinogen, UA: 0.2 E.U./dL
pH, UA: 6.5 (ref 5.0–8.0)

## 2019-10-04 MED ORDER — HYDROXYPROGESTERONE CAPROATE 275 MG/1.1ML ~~LOC~~ SOAJ
275.0000 mg | Freq: Once | SUBCUTANEOUS | Status: AC
  Administered 2019-10-04: 275 mg via SUBCUTANEOUS

## 2019-10-04 NOTE — Progress Notes (Signed)
Rob doing well. 17P today. Discussed PTL , pt has concerns with hx PTD. She complains of increased pelvic pressure and cramping. FFN today. Cervix appears closed. Contact bleeding with spec. Exam . Disucssed use of betamethasone if positive. She verbalizes and agrees to plan . Follow up 1 wk for injection, ROB in 3 wks.   Philip Aspen, CNM

## 2019-10-04 NOTE — Patient Instructions (Signed)
[place] [facility]  [facility]  [address], [place], [place] [address]  Phone: [phone]  [facility] (second location)  [address] [place], [place] [address]  Phone: [phone]  [facility] ([place]) [address], [place], [place] [address] Phone: [phone]  [facility]  [address] [place], [place] [address]  Phone: [phone] 

## 2019-10-05 ENCOUNTER — Ambulatory Visit: Payer: Medicaid Other

## 2019-10-07 LAB — FETAL FIBRONECTIN: Fetal Fibronectin: NEGATIVE

## 2019-10-12 ENCOUNTER — Ambulatory Visit (INDEPENDENT_AMBULATORY_CARE_PROVIDER_SITE_OTHER): Payer: Medicaid Other | Admitting: Certified Nurse Midwife

## 2019-10-12 ENCOUNTER — Other Ambulatory Visit: Payer: Self-pay

## 2019-10-12 VITALS — BP 108/70 | HR 101 | Wt 169.2 lb

## 2019-10-12 DIAGNOSIS — Z8751 Personal history of pre-term labor: Secondary | ICD-10-CM | POA: Diagnosis not present

## 2019-10-12 DIAGNOSIS — Z3492 Encounter for supervision of normal pregnancy, unspecified, second trimester: Secondary | ICD-10-CM

## 2019-10-12 MED ORDER — HYDROXYPROGESTERONE CAPROATE 275 MG/1.1ML ~~LOC~~ SOAJ
275.0000 mg | Freq: Once | SUBCUTANEOUS | Status: AC
  Administered 2019-10-12: 275 mg via SUBCUTANEOUS

## 2019-10-12 NOTE — Progress Notes (Signed)
Patient come in today for P17 in. Injection given in right arm. Patient tolerated well.

## 2019-10-19 ENCOUNTER — Other Ambulatory Visit: Payer: Self-pay

## 2019-10-19 ENCOUNTER — Ambulatory Visit (INDEPENDENT_AMBULATORY_CARE_PROVIDER_SITE_OTHER): Payer: Medicaid Other | Admitting: Certified Nurse Midwife

## 2019-10-19 VITALS — BP 103/70 | HR 106 | Wt 170.5 lb

## 2019-10-19 DIAGNOSIS — Z79899 Other long term (current) drug therapy: Secondary | ICD-10-CM

## 2019-10-19 DIAGNOSIS — Z3A33 33 weeks gestation of pregnancy: Secondary | ICD-10-CM

## 2019-10-19 DIAGNOSIS — O09213 Supervision of pregnancy with history of pre-term labor, third trimester: Secondary | ICD-10-CM | POA: Diagnosis not present

## 2019-10-19 MED ORDER — HYDROXYPROGESTERONE CAPROATE 275 MG/1.1ML ~~LOC~~ SOAJ
275.0000 mg | Freq: Once | SUBCUTANEOUS | Status: AC
  Administered 2019-10-19: 275 mg via SUBCUTANEOUS

## 2019-10-19 NOTE — Progress Notes (Signed)
Pt presents for #  13   inj 17P. No contractions or bleeding. Next inj due 1 week. 

## 2019-10-21 ENCOUNTER — Telehealth: Payer: Self-pay

## 2019-10-21 NOTE — Telephone Encounter (Signed)
mychart message sent- papers ready for pickup. 

## 2019-10-26 ENCOUNTER — Ambulatory Visit (INDEPENDENT_AMBULATORY_CARE_PROVIDER_SITE_OTHER): Payer: Medicaid Other | Admitting: Certified Nurse Midwife

## 2019-10-26 ENCOUNTER — Other Ambulatory Visit: Payer: Self-pay

## 2019-10-26 ENCOUNTER — Encounter: Payer: Self-pay | Admitting: Certified Nurse Midwife

## 2019-10-26 VITALS — BP 114/65 | HR 94 | Wt 172.1 lb

## 2019-10-26 DIAGNOSIS — Z3492 Encounter for supervision of normal pregnancy, unspecified, second trimester: Secondary | ICD-10-CM

## 2019-10-26 LAB — POCT URINALYSIS DIPSTICK OB
Bilirubin, UA: NEGATIVE
Blood, UA: NEGATIVE
Glucose, UA: NEGATIVE
Ketones, UA: NEGATIVE
Leukocytes, UA: NEGATIVE
Nitrite, UA: NEGATIVE
POC,PROTEIN,UA: NEGATIVE
Spec Grav, UA: 1.01 (ref 1.010–1.025)
Urobilinogen, UA: 0.2 E.U./dL
pH, UA: 5 (ref 5.0–8.0)

## 2019-10-26 MED ORDER — HYDROXYPROGESTERONE CAPROATE 275 MG/1.1ML ~~LOC~~ SOAJ
275.0000 mg | Freq: Once | SUBCUTANEOUS | Status: AC
  Administered 2019-10-26: 15:00:00 275 mg via SUBCUTANEOUS

## 2019-10-26 NOTE — Progress Notes (Signed)
ROB doing well. Feels good movement. Discussed completion of 17 P @ 36 wks. Discussed GBS and cultures. Follow 1 wk for 17 p injection . ROB 2 wk.   Philip Aspen, CNM

## 2019-10-26 NOTE — Patient Instructions (Signed)
Group B Streptococcus Infection During Pregnancy  Group B Streptococcus (GBS) is a type of bacteria (Streptococcus agalactiae) that is often found in healthy people, commonly in the rectum, vagina, and intestines. In people who are healthy and not pregnant, the bacteria rarely cause serious illness or complications. However, women who test positive for GBS during pregnancy can pass the bacteria to their baby during childbirth, which can cause serious infection in the baby after birth. Women with GBS may also have infections during their pregnancy or immediately after childbirth, such as urinary tract infections (UTIs) or infections of the uterus (uterine infections). Having GBS also increases a woman's risk of complications during pregnancy, such as early (preterm) labor or delivery, miscarriage, or stillbirth. Routine testing (screening) for GBS is recommended for all pregnant women. What increases the risk? You may have a higher risk for GBS infection during pregnancy if you had one during a past pregnancy. What are the signs or symptoms? In most cases, GBS infection does not cause symptoms in pregnant women. Signs and symptoms of a possible GBS-related infection may include:  Labor starting before the 37th week of pregnancy.  A UTI or bladder infection, which may cause: ? Fever. ? Pain or burning during urination. ? Frequent urination.  Fever during labor, along with: ? Bad-smelling discharge. ? Uterine tenderness. ? Rapid heartbeat in the mother, baby, or both. Rare but serious symptoms of a possible GBS-related infection in women include:  Blood infection (septicemia). This may cause fever, chills, or confusion.  Lung infection (pneumonia). This may cause fever, chills, cough, rapid breathing, difficulty breathing, or chest pain.  Bone, joint, skin, or soft tissue infection. How is this diagnosed? You may be screened for GBS between week 35 and week 37 of your pregnancy. If you have  symptoms of preterm labor, you may be screened earlier. This condition is diagnosed based on lab test results from:  A swab of fluid from the vagina and rectum.  A urine sample. How is this treated? This condition is treated with antibiotic medicine. When you go into labor, or as soon as your water breaks (your membranes rupture), you will be given antibiotics through an IV tube. Antibiotics will continue until after you give birth. If you are having a cesarean delivery, you do not need antibiotics unless your membranes have already ruptured. Follow these instructions at home:  Take over-the-counter and prescription medicines only as told by your health care provider.  Take your antibiotic medicine as told by your health care provider. Do not stop taking the antibiotic even if you start to feel better.  Keep all pre-birth (prenatal) visits and follow-up visits as told by your health care provider. This is important. Contact a health care provider if:  You have pain or burning when you urinate.  You have to urinate frequently.  You have a fever or chills.  You develop a bad-smelling vaginal discharge. Get help right away if:  Your membranes rupture.  You go into labor.  You have severe pain in your abdomen.  You have difficulty breathing.  You have chest pain. This information is not intended to replace advice given to you by your health care provider. Make sure you discuss any questions you have with your health care provider. Document Released: 02/03/2008 Document Revised: 02/17/2019 Document Reviewed: 05/22/2016 Elsevier Patient Education  2020 Elsevier Inc.  

## 2019-11-02 ENCOUNTER — Ambulatory Visit (INDEPENDENT_AMBULATORY_CARE_PROVIDER_SITE_OTHER): Payer: Medicaid Other | Admitting: Certified Nurse Midwife

## 2019-11-02 ENCOUNTER — Other Ambulatory Visit: Payer: Self-pay

## 2019-11-02 VITALS — BP 106/69 | HR 94 | Ht 63.0 in | Wt 173.4 lb

## 2019-11-02 DIAGNOSIS — Z79899 Other long term (current) drug therapy: Secondary | ICD-10-CM

## 2019-11-02 DIAGNOSIS — Z8751 Personal history of pre-term labor: Secondary | ICD-10-CM

## 2019-11-02 MED ORDER — HYDROXYPROGESTERONE CAPROATE 275 MG/1.1ML ~~LOC~~ SOAJ
275.0000 mg | Freq: Once | SUBCUTANEOUS | Status: AC
  Administered 2019-11-02: 275 mg via SUBCUTANEOUS

## 2019-11-02 NOTE — Progress Notes (Signed)
Patient here for Makena injection.    BP 106/69   Pulse 94   Ht 5\' 3"  (1.6 m)   Wt 173 lb 6.4 oz (78.7 kg)   LMP 02/28/2019   BMI 30.72 kg/m

## 2019-11-04 ENCOUNTER — Encounter: Payer: Self-pay | Admitting: Obstetrics and Gynecology

## 2019-11-04 ENCOUNTER — Observation Stay
Admission: EM | Admit: 2019-11-04 | Discharge: 2019-11-04 | Disposition: A | Discharge status: Home / Self Care | Payer: Medicaid Other | Attending: Certified Nurse Midwife | Admitting: Certified Nurse Midwife

## 2019-11-04 ENCOUNTER — Other Ambulatory Visit: Payer: Self-pay

## 2019-11-04 DIAGNOSIS — O4703 False labor before 37 completed weeks of gestation, third trimester: Secondary | ICD-10-CM | POA: Diagnosis not present

## 2019-11-04 DIAGNOSIS — Z8751 Personal history of pre-term labor: Secondary | ICD-10-CM | POA: Diagnosis not present

## 2019-11-04 DIAGNOSIS — Z3A35 35 weeks gestation of pregnancy: Secondary | ICD-10-CM | POA: Diagnosis not present

## 2019-11-04 NOTE — OB Triage Note (Signed)
    L&D OB Triage Note  SUBJECTIVE Bonnie Graham is a 23 y.o. G89P0101 female at [redacted]w[redacted]d, EDD Estimated Date of Delivery: 12/05/19 who presented to triage with complaints of contractions. She feels good movement, denies leaking of fluid or bleeding. .   OB History  Gravida Para Term Preterm AB Living  2 1 0 1 0 1  SAB TAB Ectopic Multiple Live Births  0 0 0 0 1    # Outcome Date GA Lbr Len/2nd Weight Sex Delivery Anes PTL Lv  2 Current           1 Preterm 05/08/18 [redacted]w[redacted]d  1415 g M Vag-Spont EPI N LIV     Complications: Premature delivery     Name: Wenda Overland    Medications Prior to Admission  Medication Sig Dispense Refill Last Dose  . HYDROXYprogesterone Caproate (MAKENA) 275 MG/1.1ML SOAJ Inject 1 Device into the skin once a week.     . Prenatal Vit-Fe Fumarate-FA (PRENATAL MULTIVITAMIN) TABS tablet Take 1 tablet by mouth daily at 12 noon.        OBJECTIVE  Nursing Evaluation:   LMP 02/28/2019    Findings:  2 contraction x 20 min  NST was performed and has been reviewed by me.  NST INTERPRETATION: Category I   FHR 125 Moderate variability Accelerations present Decelerations absent Reactive NST  Toco : 2 ctx noted  Catagory 1 Strip   ASSESSMENT Impression:  1.  Pregnancy:  G2P0101 at [redacted]w[redacted]d , EDD Estimated Date of Delivery: 12/05/19 2.  NST:  Category I  PLAN 1. Reassurance given 2. Discharge home with standard labor precautions given to return to L&D or call the office for problems. 3. Continue routine prenatal care.  Philip Aspen, CNM

## 2019-11-04 NOTE — OB Triage Note (Signed)
Pt states she had contractions and abdominal pain that began around 2300 that she rated 8/10. She states pain is better now and rates 5/10. She reports positive fetal movement and denies LOF and bleeding. EFM applied, will continue to assess

## 2019-11-08 ENCOUNTER — Other Ambulatory Visit: Payer: Self-pay

## 2019-11-08 ENCOUNTER — Encounter: Payer: Self-pay | Admitting: Certified Nurse Midwife

## 2019-11-08 ENCOUNTER — Ambulatory Visit (INDEPENDENT_AMBULATORY_CARE_PROVIDER_SITE_OTHER): Payer: Medicaid Other | Admitting: Certified Nurse Midwife

## 2019-11-08 VITALS — BP 118/68 | HR 115 | Wt 176.3 lb

## 2019-11-08 DIAGNOSIS — Z3492 Encounter for supervision of normal pregnancy, unspecified, second trimester: Secondary | ICD-10-CM

## 2019-11-08 DIAGNOSIS — Z3A37 37 weeks gestation of pregnancy: Secondary | ICD-10-CM

## 2019-11-08 DIAGNOSIS — Z3493 Encounter for supervision of normal pregnancy, unspecified, third trimester: Secondary | ICD-10-CM

## 2019-11-08 LAB — POCT URINALYSIS DIPSTICK OB
Bilirubin, UA: NEGATIVE
Glucose, UA: NEGATIVE
Ketones, UA: NEGATIVE
Leukocytes, UA: NEGATIVE
Nitrite, UA: NEGATIVE
POC,PROTEIN,UA: NEGATIVE
Spec Grav, UA: 1.02 (ref 1.010–1.025)
Urobilinogen, UA: 0.2 E.U./dL
pH, UA: 5 (ref 5.0–8.0)

## 2019-11-08 NOTE — Patient Instructions (Signed)
Group B Streptococcus Infection During Pregnancy  Group B Streptococcus (GBS) is a type of bacteria (Streptococcus agalactiae) that is often found in healthy people, commonly in the rectum, vagina, and intestines. In people who are healthy and not pregnant, the bacteria rarely cause serious illness or complications. However, women who test positive for GBS during pregnancy can pass the bacteria to their baby during childbirth, which can cause serious infection in the baby after birth. Women with GBS may also have infections during their pregnancy or immediately after childbirth, such as urinary tract infections (UTIs) or infections of the uterus (uterine infections). Having GBS also increases a woman's risk of complications during pregnancy, such as early (preterm) labor or delivery, miscarriage, or stillbirth. Routine testing (screening) for GBS is recommended for all pregnant women. What increases the risk? You may have a higher risk for GBS infection during pregnancy if you had one during a past pregnancy. What are the signs or symptoms? In most cases, GBS infection does not cause symptoms in pregnant women. Signs and symptoms of a possible GBS-related infection may include:  Labor starting before the 37th week of pregnancy.  A UTI or bladder infection, which may cause: ? Fever. ? Pain or burning during urination. ? Frequent urination.  Fever during labor, along with: ? Bad-smelling discharge. ? Uterine tenderness. ? Rapid heartbeat in the mother, baby, or both. Rare but serious symptoms of a possible GBS-related infection in women include:  Blood infection (septicemia). This may cause fever, chills, or confusion.  Lung infection (pneumonia). This may cause fever, chills, cough, rapid breathing, difficulty breathing, or chest pain.  Bone, joint, skin, or soft tissue infection. How is this diagnosed? You may be screened for GBS between week 35 and week 37 of your pregnancy. If you have  symptoms of preterm labor, you may be screened earlier. This condition is diagnosed based on lab test results from:  A swab of fluid from the vagina and rectum.  A urine sample. How is this treated? This condition is treated with antibiotic medicine. When you go into labor, or as soon as your water breaks (your membranes rupture), you will be given antibiotics through an IV tube. Antibiotics will continue until after you give birth. If you are having a cesarean delivery, you do not need antibiotics unless your membranes have already ruptured. Follow these instructions at home:  Take over-the-counter and prescription medicines only as told by your health care provider.  Take your antibiotic medicine as told by your health care provider. Do not stop taking the antibiotic even if you start to feel better.  Keep all pre-birth (prenatal) visits and follow-up visits as told by your health care provider. This is important. Contact a health care provider if:  You have pain or burning when you urinate.  You have to urinate frequently.  You have a fever or chills.  You develop a bad-smelling vaginal discharge. Get help right away if:  Your membranes rupture.  You go into labor.  You have severe pain in your abdomen.  You have difficulty breathing.  You have chest pain. This information is not intended to replace advice given to you by your health care provider. Make sure you discuss any questions you have with your health care provider. Document Released: 02/03/2008 Document Revised: 02/17/2019 Document Reviewed: 05/22/2016 Elsevier Patient Education  2020 Elsevier Inc.  

## 2019-11-08 NOTE — Progress Notes (Signed)
ROB doing well. Feels good movement. GBS and cultures today. SVE 5/60/-2 vertex. Discussed labor precautions. She declines 17P today. Follow up 1 wk.   Philip Aspen, CNM

## 2019-11-09 ENCOUNTER — Observation Stay
Admission: EM | Admit: 2019-11-09 | Discharge: 2019-11-09 | Disposition: A | Discharge status: Home / Self Care | Payer: Medicaid Other | Attending: Family Medicine | Admitting: Family Medicine

## 2019-11-09 ENCOUNTER — Other Ambulatory Visit: Payer: Self-pay

## 2019-11-09 ENCOUNTER — Encounter: Payer: Self-pay | Admitting: Obstetrics and Gynecology

## 2019-11-09 DIAGNOSIS — Z3490 Encounter for supervision of normal pregnancy, unspecified, unspecified trimester: Secondary | ICD-10-CM

## 2019-11-09 DIAGNOSIS — O4703 False labor before 37 completed weeks of gestation, third trimester: Secondary | ICD-10-CM | POA: Diagnosis present

## 2019-11-09 DIAGNOSIS — Z3A36 36 weeks gestation of pregnancy: Secondary | ICD-10-CM | POA: Diagnosis not present

## 2019-11-09 NOTE — OB Triage Note (Signed)
Pt 23 yo, [redacted]w[redacted]d, G2P1 presents to unit w/ c/o ctxs q5 minutes that began around 11 pm, denies LOF. Reports fetal movement, and states she's had light vaginal spotting after having her cervix checked earlier this morning. Rates ctxs at 7/10. Monitors applied and assessing. VSS. Will continue to monitor.

## 2019-11-09 NOTE — Discharge Instructions (Signed)
Signs and Symptoms of Labor Labor is your body's natural process of moving your baby, placenta, and umbilical cord out of your uterus. The process of labor usually starts when your baby is full-term, between 37 and 40 weeks of pregnancy. How will I know when I am close to going into labor? As your body prepares for labor and the birth of your baby, you may notice the following symptoms in the weeks and days before true labor starts:  Having a strong desire to get your home ready to receive your new baby. This is called nesting. Nesting may be a sign that labor is approaching, and it may occur several weeks before birth. Nesting may involve cleaning and organizing your home.  Passing a small amount of thick, bloody mucus out of your vagina (normal bloody show or losing your mucus plug). This may happen more than a week before labor begins, or it might occur right before labor begins as the opening of the cervix starts to widen (dilate). For some women, the entire mucus plug passes at once. For others, smaller portions of the mucus plug may gradually pass over several days.  Your baby moving (dropping) lower in your pelvis to get into position for birth (lightening). When this happens, you may feel more pressure on your bladder and pelvic bone and less pressure on your ribs. This may make it easier to breathe. It may also cause you to need to urinate more often and have problems with bowel movements.  Having "practice contractions" (Braxton Hicks contractions) that occur at irregular (unevenly spaced) intervals that are more than 10 minutes apart. This is also called false labor. False labor contractions are common after exercise or sexual activity, and they will stop if you change position, rest, or drink fluids. These contractions are usually mild and do not get stronger over time. They may feel like: ? A backache or back pain. ? Mild cramps, similar to menstrual cramps. ? Tightening or pressure in  your abdomen. Other early symptoms that labor may be starting soon include:  Nausea or loss of appetite.  Diarrhea.  Having a sudden burst of energy, or feeling very tired.  Mood changes.  Having trouble sleeping. How will I know when labor has begun? Signs that true labor has begun may include:  Having contractions that come at regular (evenly spaced) intervals and increase in intensity. This may feel like more intense tightening or pressure in your abdomen that moves to your back. ? Contractions may also feel like rhythmic pain in your upper thighs or back that comes and goes at regular intervals. ? For first-time mothers, this change in intensity of contractions often occurs at a more gradual pace. ? Women who have given birth before may notice a more rapid progression of contraction changes.  Having a feeling of pressure in the vaginal area.  Your water breaking (rupture of membranes). This is when the sac of fluid that surrounds your baby breaks. When this happens, you will notice fluid leaking from your vagina. This may be clear or blood-tinged. Labor usually starts within 24 hours of your water breaking, but it may take longer to begin. ? Some women notice this as a gush of fluid. ? Others notice that their underwear repeatedly becomes damp. Follow these instructions at home:   When labor starts, or if your water breaks, call your health care provider or nurse care line. Based on your situation, they will determine when you should go in for an   exam.  When you are in early labor, you may be able to rest and manage symptoms at home. Some strategies to try at home include: ? Breathing and relaxation techniques. ? Taking a warm bath or shower. ? Listening to music. ? Using a heating pad on the lower back for pain. If you are directed to use heat:  Place a towel between your skin and the heat source.  Leave the heat on for 20-30 minutes.  Remove the heat if your skin turns  bright red. This is especially important if you are unable to feel pain, heat, or cold. You may have a greater risk of getting burned. Get help right away if:  You have painful, regular contractions that are 5 minutes apart or less.  Labor starts before you are [redacted] weeks along in your pregnancy.  You have a fever.  You have a headache that does not go away.  You have bright red blood coming from your vagina.  You do not feel your baby moving.  You have a sudden onset of: ? Severe headache with vision problems. ? Nausea, vomiting, or diarrhea. ? Chest pain or shortness of breath. These symptoms may be an emergency. If your health care provider recommends that you go to the hospital or birth center where you plan to deliver, do not drive yourself. Have someone else drive you, or call emergency services (911 in the [place] Summary  Labor is your body's natural process of moving your baby, placenta, and umbilical cord out of your uterus.  The process of labor usually starts when your baby is full-term, between 72 and 40 weeks of pregnancy.  When labor starts, or if your water breaks, call your health care provider or nurse care line. Based on your situation, they will determine when you should go in for an exam. This information is not intended to replace advice given to you by your health care provider. Make sure you discuss any questions you have with your health care provider. Document Released: 04/03/2017 Document Revised: 07/27/2017 Document Reviewed: 04/03/2017 Elsevier Patient Education  [place]: When contractions begin, you should start to time them from the beginning of one contraction to the beginning of the next.  When contractions are 5-10 minutes apart or less and have been regular for at least an hour, you should call your health care provider.  Notify your doctor if any of the following occur: 1. Bleeding from the vagina 7. Sudden, constant, or occasional  abdominal pain  2. Pain or burning when urinating 8. Sudden gushing of fluid from the vagina (with or without continued leaking)  3. Chills or fever 9. Fainting spells, "black outs" or loss of consciousness  4. Increase in vaginal discharge 10. Severe or continued nausea or vomiting  5. Pelvic pressure (sudden increase) 11. Blurring of vision or spots before the eyes  6. Baby moving less than usual 12. Leaking of fluid    FETAL KICK COUNT: Lie on your left side for one hour after a meal, and count the number of times your baby kicks. If it is less than 5 times, get up, move around and drink some juice. Repeat the test 30 minutes later. If it is still less than 5 kicks in an hour, notify your doctor.

## 2019-11-09 NOTE — Final Progress Note (Signed)
L&D OB Triage Note  Bonnie Graham is a 23 y.o. G23P0101 female at [redacted]w[redacted]d, EDD Estimated Date of Delivery: 12/05/19 who presented to triage for complaints of contractions q 5 minutes starting at 5 pm.  She was evaluated by the nurses with no significant findings for active labor. Vital signs stable. An NST was performed and has been reviewed by MD.     Physical Exam:  Blood pressure 124/73, pulse 89, temperature 98.6 F (37 C), temperature source Oral, resp. rate 18, height 5\' 3"  (1.6 m), weight 79.9 kg, last menstrual period 02/28/2019, currently breastfeeding.  Dilation: 5 Effacement (%): 60 Cervical Position: Middle Station: -1 Presentation: Vertex Exam by:: Sherrye Payor RN   NST INTERPRETATION: Indications: rule out uterine contractions  Mode: External Baseline Rate (A): 135 bpm(fht) Variability: Moderate Accelerations: 15 x 15 Decelerations: None     Contraction Frequency (min): 3-6  Impression: reactive   Plan: NST performed was reviewed and was found to be reactive. She was discharged home with labor precautions.  Cervix unchanged from earlier exam today in clinic. Continue routine prenatal care. Follow up with OB/GYN as previously scheduled.     Rubie Maid, MD [facility]

## 2019-11-10 LAB — GC/CHLAMYDIA PROBE AMP
Chlamydia trachomatis, NAA: NEGATIVE
Neisseria Gonorrhoeae by PCR: NEGATIVE

## 2019-11-10 LAB — STREP GP B NAA: Strep Gp B NAA: NEGATIVE

## 2019-11-11 NOTE — L&D Delivery Note (Signed)
       Delivery Note   Bonnie Graham is a 24 y.o. G2P0101 at [redacted]w[redacted]d Estimated Date of Delivery: 12/05/19  PRE-OPERATIVE DIAGNOSIS:  1) [redacted]w[redacted]d pregnancy.  2) Labor  POST-OPERATIVE DIAGNOSIS:  1) [redacted]w[redacted]d pregnancy s/p Vaginal, Spontaneous   Delivery Type: Vaginal, Spontaneous    Delivery Anesthesia: Epidural   Labor Complications:      ESTIMATED BLOOD LOSS: 100  ml    FINDINGS:   1) female infant, Apgar scores of    at 1 minute and    at 5 minutes and a birthweight of   ounces.    2) Nuchal cord: Yes  SPECIMENS:   PLACENTA:   Appearance: Intact    Removal: Spontaneous      Disposition:    DISPOSITION:  Infant to left in stable condition in the delivery room, with L&D personnel and mother,  NARRATIVE SUMMARY: Labor course:  Ms. Bonnie Graham is a [id number] at [redacted]w[redacted]d who presented for labor management.  She progressed well in labor with pitocin.  She received the appropriate anesthesia and proceeded to complete dilation. She evidenced good maternal expulsive effort during the second stage. She went on to deliver a viable infant. The placenta delivered without problems and was noted to be complete. A perineal and vaginal examination was performed. Episiotomy/Lacerations: None   Elonda Husky, M.D. 11/13/2019 12:10 PM

## 2019-11-13 ENCOUNTER — Inpatient Hospital Stay
Admission: EM | Admit: 2019-11-13 | Discharge: 2019-11-14 | DRG: 805 | Disposition: A | Discharge status: Home / Self Care | Payer: Medicaid Other | Attending: Obstetrics and Gynecology | Admitting: Obstetrics and Gynecology

## 2019-11-13 ENCOUNTER — Other Ambulatory Visit: Payer: Self-pay

## 2019-11-13 ENCOUNTER — Encounter: Payer: Self-pay | Admitting: Obstetrics and Gynecology

## 2019-11-13 ENCOUNTER — Inpatient Hospital Stay: Payer: Medicaid Other | Admitting: Anesthesiology

## 2019-11-13 DIAGNOSIS — Z3A36 36 weeks gestation of pregnancy: Secondary | ICD-10-CM

## 2019-11-13 DIAGNOSIS — Z3490 Encounter for supervision of normal pregnancy, unspecified, unspecified trimester: Secondary | ICD-10-CM

## 2019-11-13 DIAGNOSIS — Z20822 Contact with and (suspected) exposure to covid-19: Secondary | ICD-10-CM | POA: Diagnosis present

## 2019-11-13 LAB — TYPE AND SCREEN
ABO/RH(D): A POS
Antibody Screen: NEGATIVE

## 2019-11-13 LAB — CBC
HCT: 31.1 % — ABNORMAL LOW (ref 36.0–46.0)
Hemoglobin: 10.4 g/dL — ABNORMAL LOW (ref 12.0–15.0)
MCH: 27.2 pg (ref 26.0–34.0)
MCHC: 33.4 g/dL (ref 30.0–36.0)
MCV: 81.4 fL (ref 80.0–100.0)
Platelets: 370 10*3/uL (ref 150–400)
RBC: 3.82 MIL/uL — ABNORMAL LOW (ref 3.87–5.11)
RDW: 15 % (ref 11.5–15.5)
WBC: 16.8 10*3/uL — ABNORMAL HIGH (ref 4.0–10.5)
nRBC: 0 % (ref 0.0–0.2)

## 2019-11-13 LAB — RESPIRATORY PANEL BY RT PCR (FLU A&B, COVID)
Influenza A by PCR: NEGATIVE
Influenza B by PCR: NEGATIVE
SARS Coronavirus 2 by RT PCR: NEGATIVE

## 2019-11-13 MED ORDER — OXYCODONE-ACETAMINOPHEN 5-325 MG PO TABS
1.0000 | ORAL_TABLET | ORAL | Status: DC | PRN
  Administered 2019-11-14 (×2): 1 via ORAL
  Filled 2019-11-13 (×2): qty 1

## 2019-11-13 MED ORDER — LIDOCAINE HCL (PF) 1 % IJ SOLN
INTRAMUSCULAR | Status: AC
  Filled 2019-11-13: qty 30

## 2019-11-13 MED ORDER — IBUPROFEN 600 MG PO TABS
600.0000 mg | ORAL_TABLET | Freq: Four times a day (QID) | ORAL | Status: DC
  Administered 2019-11-14 (×3): 600 mg via ORAL
  Filled 2019-11-13 (×3): qty 1

## 2019-11-13 MED ORDER — PRENATAL MULTIVITAMIN CH
1.0000 | ORAL_TABLET | Freq: Every day | ORAL | Status: DC
  Administered 2019-11-14: 11:00:00 1 via ORAL
  Filled 2019-11-13: qty 1

## 2019-11-13 MED ORDER — DIPHENHYDRAMINE HCL 25 MG PO CAPS: 25.0000 mg | ORAL_CAPSULE | Freq: Four times a day (QID) | ORAL | Status: DC | PRN

## 2019-11-13 MED ORDER — LACTATED RINGERS IV BOLUS
1000.0000 mL | Freq: Once | INTRAVENOUS | Status: AC
  Administered 2019-11-13: 05:00:00 1000 mL via INTRAVENOUS

## 2019-11-13 MED ORDER — PHENYLEPHRINE 40 MCG/ML (10ML) SYRINGE FOR IV PUSH (FOR BLOOD PRESSURE SUPPORT)
80.0000 ug | PREFILLED_SYRINGE | INTRAVENOUS | Status: DC | PRN
  Filled 2019-11-13: qty 10

## 2019-11-13 MED ORDER — ZOLPIDEM TARTRATE 5 MG PO TABS: 5.0000 mg | ORAL_TABLET | Freq: Every evening | ORAL | Status: DC | PRN

## 2019-11-13 MED ORDER — LIDOCAINE HCL (PF) 1 % IJ SOLN
INTRAMUSCULAR | Status: DC | PRN
  Administered 2019-11-13: 1.2 mL via SUBCUTANEOUS

## 2019-11-13 MED ORDER — LACTATED RINGERS IV SOLN: 500.0000 mL | Freq: Once | INTRAVENOUS | Status: DC

## 2019-11-13 MED ORDER — OXYTOCIN 10 UNIT/ML IJ SOLN
INTRAMUSCULAR | Status: AC
  Filled 2019-11-13: qty 2

## 2019-11-13 MED ORDER — DIPHENHYDRAMINE HCL 50 MG/ML IJ SOLN: 12.5000 mg | INTRAMUSCULAR | Status: DC | PRN

## 2019-11-13 MED ORDER — EPHEDRINE 5 MG/ML INJ
10.0000 mg | INTRAVENOUS | Status: DC | PRN
  Filled 2019-11-13: qty 4
  Filled 2019-11-13: qty 2

## 2019-11-13 MED ORDER — FENTANYL 2.5 MCG/ML W/ROPIVACAINE 0.15% IN NS 100 ML EPIDURAL (ARMC): 12.0000 mL/h | EPIDURAL | Status: DC

## 2019-11-13 MED ORDER — EPHEDRINE 5 MG/ML INJ
10.0000 mg | INTRAVENOUS | Status: DC | PRN
  Administered 2019-11-13: 09:00:00 10 mg via INTRAVENOUS
  Filled 2019-11-13: qty 2

## 2019-11-13 MED ORDER — LIDOCAINE-EPINEPHRINE (PF) 1.5 %-1:200000 IJ SOLN
INTRAMUSCULAR | Status: DC | PRN
  Administered 2019-11-13: 3 mL via EPIDURAL

## 2019-11-13 MED ORDER — IBUPROFEN 600 MG PO TABS
600.0000 mg | ORAL_TABLET | Freq: Four times a day (QID) | ORAL | Status: DC
  Administered 2019-11-13 (×2): 600 mg via ORAL
  Filled 2019-11-13 (×2): qty 1

## 2019-11-13 MED ORDER — LACTATED RINGERS IV SOLN: INTRAVENOUS | Status: DC

## 2019-11-13 MED ORDER — DOCUSATE SODIUM 100 MG PO CAPS
100.0000 mg | ORAL_CAPSULE | Freq: Two times a day (BID) | ORAL | Status: DC
  Administered 2019-11-13: 21:00:00 100 mg via ORAL
  Filled 2019-11-13 (×2): qty 1

## 2019-11-13 MED ORDER — SIMETHICONE 80 MG PO CHEW: 80.0000 mg | CHEWABLE_TABLET | ORAL | Status: DC | PRN

## 2019-11-13 MED ORDER — ACETAMINOPHEN 325 MG PO TABS
650.0000 mg | ORAL_TABLET | ORAL | Status: DC | PRN
  Administered 2019-11-13 (×3): 650 mg via ORAL
  Filled 2019-11-13 (×2): qty 2

## 2019-11-13 MED ORDER — OXYTOCIN 40 UNITS IN NORMAL SALINE INFUSION - SIMPLE MED
1.0000 m[IU]/min | INTRAVENOUS | Status: DC
  Administered 2019-11-13: 09:00:00 2 m[IU]/min via INTRAVENOUS

## 2019-11-13 MED ORDER — FENTANYL 2.5 MCG/ML W/ROPIVACAINE 0.15% IN NS 100 ML EPIDURAL (ARMC)
EPIDURAL | Status: AC
  Filled 2019-11-13: qty 100

## 2019-11-13 MED ORDER — OXYTOCIN 40 UNITS IN NORMAL SALINE INFUSION - SIMPLE MED
INTRAVENOUS | Status: AC
  Filled 2019-11-13: qty 1000

## 2019-11-13 MED ORDER — MISOPROSTOL 200 MCG PO TABS
ORAL_TABLET | ORAL | Status: AC
  Filled 2019-11-13: qty 4

## 2019-11-13 MED ORDER — FENTANYL 2.5 MCG/ML W/ROPIVACAINE 0.15% IN NS 100 ML EPIDURAL (ARMC)
EPIDURAL | Status: DC | PRN
  Administered 2019-11-13: 12 mL/h via EPIDURAL

## 2019-11-13 MED ORDER — OXYTOCIN 40 UNITS IN NORMAL SALINE INFUSION - SIMPLE MED: 2.5000 [IU]/h | INTRAVENOUS | Status: DC | PRN

## 2019-11-13 MED ORDER — TETANUS-DIPHTH-ACELL PERTUSSIS 5-2.5-18.5 LF-MCG/0.5 IM SUSP: 0.5000 mL | Freq: Once | INTRAMUSCULAR | Status: DC

## 2019-11-13 MED ORDER — BENZOCAINE-MENTHOL 20-0.5 % EX AERO
1.0000 "application " | INHALATION_SPRAY | CUTANEOUS | Status: DC | PRN
  Administered 2019-11-13: 1 via TOPICAL

## 2019-11-13 MED ORDER — AMMONIA AROMATIC IN INHA
RESPIRATORY_TRACT | Status: AC
  Filled 2019-11-13: qty 10

## 2019-11-13 NOTE — Plan of Care (Signed)
  Problem: Education: Goal: Knowledge of General Education information will improve Description Including pain rating scale, medication(s)/side effects and non-pharmacologic comfort measures Outcome: Progressing   

## 2019-11-13 NOTE — OB Triage Note (Signed)
Pt is a G2P1 and [redacted]w[redacted]d presenting to L&D with c/o ctx every 3-4 minutes apart since 0145 this morning. Pt denies LOF and confirms positive fetal movement. Pt took 1000mg  Tylenol at 2300 on 11/12/19 and states ctx discomfort did not ease up and has gradually got closer together. Vital signs WDL. Pt denies any needs at this time.

## 2019-11-13 NOTE — Anesthesia Procedure Notes (Signed)
Epidural Patient location during procedure: OB Start time: 11/13/2019 8:02 AM End time: 11/13/2019 8:27 AM  Preanesthetic Checklist Completed: patient identified, IV checked, site marked, risks and benefits discussed, surgical consent, monitors and equipment checked, pre-op evaluation and timeout performed  Epidural Patient position: sitting Prep: Betadine Patient monitoring: heart rate, continuous pulse ox and blood pressure Approach: midline Location: L4-L5 Injection technique: LOR saline  Needle:  Needle type: Tuohy  Needle gauge: 17 G Needle length: 9 cm and 9 Needle insertion depth: 7 cm Catheter type: closed end flexible Catheter size: 20 Guage Catheter at skin depth: 12 cm Test dose: negative and 1.5% lidocaine with Epi 1:200 K  Assessment Events: blood not aspirated, injection not painful, no injection resistance, no paresthesia and negative IV test  Additional Notes   Patient tolerated the insertion well without complications.Reason for block:procedure for pain

## 2019-11-13 NOTE — H&P (Signed)
History and Physical   HPI  Bonnie Graham is a 24 y.o. G2P0101 at [redacted]w[redacted]d Estimated Date of Delivery: 12/05/19 who is being admitted for labor management.    OB History  OB History  Gravida Para Term Preterm AB Living  2 1 0 1 0 1  SAB TAB Ectopic Multiple Live Births  0 0 0 0 1    # Outcome Date GA Lbr Len/2nd Weight Sex Delivery Anes PTL Lv  2 Current           1 Preterm 05/08/18 [redacted]w[redacted]d  1415 g M Vag-Spont EPI N LIV     Complications: Premature delivery     Name: [name]    PROBLEM LIST  Pregnancy complications or risks: Patient Active Problem List   Diagnosis Date Noted  . Pregnancy 11/09/2019  . Labor and delivery, indication for care 11/04/2019  . 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 History of preterm delivery 06/17/2018  . Maternal varicella, non-immune 11/11/2017  . History of heavy periods 01/02/2017  . Dysmenorrhea 01/02/2017  . Family history of endometriosis in first degree relative 01/02/2017  . Family history of first-degree relative with cardiomyopathy 04/16/2016     Prenatal labs and studies: ABO, Rh: --/--/A POS (01/03 0426) Antibody: NEG (01/03 0426) Rubella: 1.18 (06/09 1209) RPR: Non Reactive (11/04 0947)  HBsAg: Negative (06/09 1209)  HIV: Non Reactive (06/09 1209)  GBS:--/Negative (12/29 1003)   Past Medical History:  Diagnosis Date  . Heavy periods   . Painful menstrual periods      Past Surgical History:  Procedure Laterality Date  . NO PAST SURGERIES       Medications    Current Discharge Medication List    CONTINUE these medications which have NOT CHANGED   Details  HYDROXYprogesterone Caproate (MAKENA) 275 MG/1.1ML SOAJ Inject 1 Device into the skin once a week.    Prenatal Vit-Fe Fumarate-FA (PRENATAL MULTIVITAMIN) TABS tablet Take 1 tablet by mouth daily at 12 noon.         Allergies  Patient has no known allergies.  Review of Systems  Pertinent items are noted in HPI.  Physical Exam  BP 115/64    Pulse 84   Temp 97.7 F (36.5 C) (Oral)   Resp 16   Ht 5\' 3"  (1.6 m)   Wt 79.4 kg   LMP 02/28/2019   SpO2 96%   BMI 31.00 kg/m   Lungs:  CTA B Cardio: RRR without M/R/G Abd: Soft, gravid, NT Presentation: cephalic EXT: No C/C/ 1+ Edema DTRs: 2+ B CERVIX: Dilation: 7 Effacement (%): 90 Cervical Position: Middle Station: 0 Presentation: Vertex Exam by:: Dr. [email or web address]   See Prenatal records for more detailed PE.     FHR:  Variability: Good {> 6 bpm)  Toco: Uterine Contractions: Q [place] Bores   Test Results  Results for orders placed or performed during the hospital encounter of 11/13/19 (from the past 24 hour(s))  Respiratory Panel by RT PCR (Flu A&B, Covid) - Nasopharyngeal Swab     Status: None   Collection Time: 11/13/19  4:25 AM   Specimen: Nasopharyngeal Swab  Result Value Ref Range   SARS Coronavirus 2 by RT PCR NEGATIVE NEGATIVE   Influenza A by PCR NEGATIVE NEGATIVE   Influenza B by PCR NEGATIVE NEGATIVE  CBC     Status: Abnormal   Collection Time: 11/13/19  4:26 AM  Result Value Ref Range   WBC 16.8 (H) 4.0 - 10.5 K/uL   RBC 3.82 (  L) 3.87 - 5.11 MIL/uL   Hemoglobin 10.4 (L) 12.0 - 15.0 g/dL   HCT 31.1 (L) 36.0 - 46.0 %   MCV 81.4 80.0 - 100.0 fL   MCH 27.2 26.0 - 34.0 pg   MCHC 33.4 30.0 - 36.0 g/dL   RDW 15.0 11.5 - 15.5 %   Platelets 370 150 - 400 K/uL   nRBC 0.0 0.0 - 0.2 %  Type and screen [place]     Status: None   Collection Time: 11/13/19  4:26 AM  Result Value Ref Range   ABO/RH(D) A POS    Antibody Screen NEG    Sample Expiration      11/16/2019,2359 Performed at [facility], [address], [place], [place] [address]      Assessment  [phone] at [redacted]w[redacted]d Estimated Date of Delivery: 12/05/19  The fetus is reassuring.   Patient Active Problem List   Diagnosis Date Noted  . Pregnancy 11/09/2019  . Labor and delivery, indication for care 11/04/2019  . 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 History of preterm  delivery 06/17/2018  . Maternal varicella, non-immune 11/11/2017  . History of heavy periods 01/02/2017  . Dysmenorrhea 01/02/2017  . Family history of endometriosis in first degree relative 01/02/2017  . Family history of first-degree relative with cardiomyopathy 04/16/2016    Plan  1. Admit to L&D :   Expect vaginal delivery 2. EFM: -- Category 1 3. Epidural if desired.  Stadol for IV pain until epidural requested. 4. Admission labs   Finis Bud, M.D. 11/13/2019 11:34 AM

## 2019-11-13 NOTE — Anesthesia Preprocedure Evaluation (Signed)
Anesthesia Evaluation  Patient identified by MRN, date of birth, ID band Patient awake    Reviewed: Allergy & Precautions, NPO status , Patient's Chart, lab work & pertinent test results  History of Anesthesia Complications Negative for: history of anesthetic complications  Airway Mallampati: II       Dental   Pulmonary neg sleep apnea, neg COPD,           Cardiovascular (-) hypertension(-) dysrhythmias + Valvular Problems/Murmurs      Neuro/Psych neg Seizures    GI/Hepatic Neg liver ROS, neg GERD  ,  Endo/Other  neg diabetes  Renal/GU negative Renal ROS     Musculoskeletal   Abdominal   Peds  Hematology   Anesthesia Other Findings   Reproductive/Obstetrics                             Anesthesia Physical Anesthesia Plan  ASA: II  Anesthesia Plan: Epidural   Post-op Pain Management:    Induction:   PONV Risk Score and Plan:   Airway Management Planned:   Additional Equipment:   Intra-op Plan:   Post-operative Plan:   Informed Consent: I have reviewed the patients History and Physical, chart, labs and discussed the procedure including the risks, benefits and alternatives for the proposed anesthesia with the patient or authorized representative who has indicated his/her understanding and acceptance.       Plan Discussed with:   Anesthesia Plan Comments:         Anesthesia Quick Evaluation

## 2019-11-14 NOTE — Anesthesia Postprocedure Evaluation (Signed)
Anesthesia Post Note  Patient: Bonnie Graham  Procedure(s) Performed: AN AD HOC LABOR EPIDURAL  Patient location during evaluation: Mother Baby Anesthesia Type: Epidural Level of consciousness: awake and alert Pain management: pain level controlled Vital Signs Assessment: post-procedure vital signs reviewed and stable Respiratory status: spontaneous breathing, nonlabored ventilation and respiratory function stable Cardiovascular status: stable Postop Assessment: no headache, no backache and epidural receding Anesthetic complications: no     Last Vitals:  Vitals:   11/14/19 0300 11/14/19 0758  BP: 115/81 102/60  Pulse: 80 68  Resp: 20 18  Temp: 36.9 C 36.7 C  SpO2: 99% 97%    Last Pain:  Vitals:   11/14/19 0758  TempSrc: Axillary  PainSc:                  [name]

## 2019-11-14 NOTE — Discharge Instructions (Signed)
Postpartum Care After Vaginal Delivery This sheet gives you information about how to care for yourself from the time you deliver your baby to up to 6-12 weeks after delivery (postpartum period). Your health care provider may also give you more specific instructions. If you have problems or questions, contact your health care provider. Follow these instructions at home: Vaginal bleeding  It is normal to have vaginal bleeding (lochia) after delivery. Wear a sanitary pad for vaginal bleeding and discharge. ? During the first week after delivery, the amount and appearance of lochia is often similar to a menstrual period. ? Over the next few weeks, it will gradually decrease to a dry, yellow-brown discharge. ? For most women, lochia stops completely by 4-6 weeks after delivery. Vaginal bleeding can vary from woman to woman.  Change your sanitary pads frequently. Watch for any changes in your flow, such as: ? A sudden increase in volume. ? A change in color. ? Large blood clots.  If you pass a blood clot from your vagina, save it and call your health care provider to discuss. Do not flush blood clots down the toilet before talking with your health care provider.  Do not use tampons or douches until your health care provider says this is safe.  If you are not breastfeeding, your period should return 6-8 weeks after delivery. If you are feeding your child breast milk only (exclusive breastfeeding), your period may not return until you stop breastfeeding. Perineal care  Keep the area between the vagina and the anus (perineum) clean and dry as told by your health care provider. Use medicated pads and pain-relieving sprays and creams as directed.  If you had a cut in the perineum (episiotomy) or a tear in the vagina, check the area for signs of infection until you are healed. Check for: ? More redness, swelling, or pain. ? Fluid or blood coming from the cut or tear. ? Warmth. ? Pus or a bad  smell.  You may be given a squirt bottle to use instead of wiping to clean the perineum area after you go to the bathroom. As you start healing, you may use the squirt bottle before wiping yourself. Make sure to wipe gently.  To relieve pain caused by an episiotomy, a tear in the vagina, or swollen veins in the anus (hemorrhoids), try taking a warm sitz bath 2-3 times a day. A sitz bath is a warm water bath that is taken while you are sitting down. The water should only come up to your hips and should cover your buttocks. Breast care  Within the first few days after delivery, your breasts may feel heavy, full, and uncomfortable (breast engorgement). Milk may also leak from your breasts. Your health care provider can suggest ways to help relieve the discomfort. Breast engorgement should go away within a few days.  If you are breastfeeding: ? Wear a bra that supports your breasts and fits you well. ? Keep your nipples clean and dry. Apply creams and ointments as told by your health care provider. ? You may need to use breast pads to absorb milk that leaks from your breasts. ? You may have uterine contractions every time you breastfeed for up to several weeks after delivery. Uterine contractions help your uterus return to its normal size. ? If you have any problems with breastfeeding, work with your health care provider or lactation consultant.  If you are not breastfeeding: ? Avoid touching your breasts a lot. Doing this can make   your breasts produce more milk. ? Wear a good-fitting bra and use cold packs to help with swelling. ? Do not squeeze out (express) milk. This causes you to make more milk. Intimacy and sexuality  Ask your health care provider when you can engage in sexual activity. This may depend on: ? Your risk of infection. ? How fast you are healing. ? Your comfort and desire to engage in sexual activity.  You are able to get pregnant after delivery, even if you have not had  your period. If desired, talk with your health care provider about methods of birth control (contraception). Medicines  Take over-the-counter and prescription medicines only as told by your health care provider.  If you were prescribed an antibiotic medicine, take it as told by your health care provider. Do not stop taking the antibiotic even if you start to feel better. Activity  Gradually return to your normal activities as told by your health care provider. Ask your health care provider what activities are safe for you.  Rest as much as possible. Try to rest or take a nap while your baby is sleeping. Eating and drinking   Drink enough fluid to keep your urine pale yellow.  Eat high-fiber foods every day. These may help prevent or relieve constipation. High-fiber foods include: ? Whole grain cereals and breads. ? Brown rice. ? Beans. ? Fresh fruits and vegetables.  Do not try to lose weight quickly by cutting back on calories.  Take your prenatal vitamins until your postpartum checkup or until your health care provider tells you it is okay to stop. Lifestyle  Do not use any products that contain nicotine or tobacco, such as cigarettes and e-cigarettes. If you need help quitting, ask your health care provider.  Do not drink alcohol, especially if you are breastfeeding. General instructions  Keep all follow-up visits for you and your baby as told by your health care provider. Most women visit their health care provider for a postpartum checkup within the first 3-6 weeks after delivery. Contact a health care provider if:  You feel unable to cope with the changes that your child brings to your life, and these feelings do not go away.  You feel unusually sad or worried.  Your breasts become red, painful, or hard.  You have a fever.  You have trouble holding urine or keeping urine from leaking.  You have little or no interest in activities you used to enjoy.  You have not  breastfed at all and you have not had a menstrual period for 12 weeks after delivery.  You have stopped breastfeeding and you have not had a menstrual period for 12 weeks after you stopped breastfeeding.  You have questions about caring for yourself or your baby.  You pass a blood clot from your vagina. Get help right away if:  You have chest pain.  You have difficulty breathing.  You have sudden, severe leg pain.  You have severe pain or cramping in your lower abdomen.  You bleed from your vagina so much that you fill more than one sanitary pad in one hour. Bleeding should not be heavier than your heaviest period.  You develop a severe headache.  You faint.  You have blurred vision or spots in your vision.  You have bad-smelling vaginal discharge.  You have thoughts about hurting yourself or your baby. If you ever feel like you may hurt yourself or others, or have thoughts about taking your own life, get help  right away. You can go to the nearest emergency department or call:  Your local emergency services (911 in the [place]  A suicide crisis helpline, such as the National Suicide Prevention Lifeline at 914 100 2361. This is open 24 hours a day. Summary  The period of time right after you deliver your newborn up to 6-12 weeks after delivery is called the postpartum period.  Gradually return to your normal activities as told by your health care provider.  Keep all follow-up visits for you and your baby as told by your health care provider. This information is not intended to replace advice given to you by your health care provider. Make sure you discuss any questions you have with your health care provider. Document Revised: 10/30/2017 Document Reviewed: 08/10/2017 Elsevier Patient Education  2020 [employer]. Breastfeeding Tips for a Good Latch Latching is how your baby's mouth attaches to your nipple to breastfeed. It is an important part of breastfeeding. Your baby  may have trouble latching for a number of reasons. A poor latch may cause you to have cracked or sore nipples or other problems. Follow these instructions at home: How to position your baby  Find a comfortable place to sit or lie down. Your neck and back should be well supported.  If you are seated, place a pillow or rolled-up blanket under your baby. This will bring him or her to the level of your breast.  Make sure that your baby's belly (abdomen) is facing your belly.  Try different positions to find one that works best for you and your baby. How to help your baby latch   To start, gently rub your breast. Move your fingertips in a circle as you massage from your chest wall toward your nipple. This helps milk flow. Keep doing this during feeding if needed.  Position your breast. Hold your breast with four fingers underneath and your thumb above your nipple. Keep your fingers away from your nipple and your baby's mouth. Follow these steps to help your baby latch: 1. Rub your baby's lips gently with your finger or nipple. 2. When your baby's mouth is open wide enough, quickly bring your baby to your breast and place your whole nipple into your baby's mouth. Place as much of the colored area around your nipple (areola)as possible into your baby's mouth. 3. Your baby's tongue should be between his or her lower gum and your breast. 4. You should be able to see more areola above your baby's upper lip than below the lower lip. 5. When your baby starts sucking, you will feel a gentle pull on your nipple. You should not feel any pain. Be patient. It is common for a baby to suck for about 2-3 minutes to start the flow of breast milk. 6. Make sure that your baby's mouth is in the right position around your nipple. Your baby's lips should make a seal on your breast and be turned outward.  General instructions  Look for these signs that your baby has latched on to your nipple: ? The baby is quietly  tugging or sucking without causing you pain. ? You hear the baby swallow after every 3 or 4 sucks. ? You see movement above and in front of the baby's ears while he or she is sucking.  Be aware of these signs that your baby has not latched on to your nipple: ? The baby makes sucking sounds or smacking sounds while feeding. ? You have nipple pain.  If your  baby is not latched well, put your little finger between your baby's gums and your nipple. This will break the seal. Then try to help your baby latch again.  If you keep having problems, get help from a breastfeeding specialist (lactation consultant). Contact a doctor if:  You have cracking or soreness in your nipples that lasts longer than 1 week.  You have nipple pain.  Your breasts are filled with too much milk (engorgement), and this does not improve after 48-72 hours.  You have a plugged milk duct and a fever.  You follow the tips for a good latch but you keep having problems or concerns.  You have a pus-like fluid coming from your breast.  Your baby is not gaining weight.  Your baby loses weight. Summary  Latching is how your baby's mouth attaches to your nipple to breastfeed.  Try different positions for breastfeeding to find one that works best for you and your baby.  A poor latch may cause you to have cracked or sore nipples or other problems. This information is not intended to replace advice given to you by your health care provider. Make sure you discuss any questions you have with your health care provider. Document Revised: 02/16/2019 Document Reviewed: 06/03/2017 Elsevier Patient Education  2020 [employer] Inc[employer]  

## 2019-11-14 NOTE — Discharge Summary (Signed)
                              Discharge Summary  Date of Admission: 11/13/2019  Date of Discharge: 11/14/2019  Admitting Diagnosis: Onset of Labor at [redacted]w[redacted]d  Mode of Delivery: normal spontaneous vaginal delivery                 Discharge Diagnosis: No other diagnosis   Intrapartum Procedures: Atificial rupture of membranes, epidural and pitocin augmentation   Post partum procedures:   Complications: none                      Discharge Day SOAP Note:  Progress Note - Vaginal Delivery  Bonnie Graham is a 24 y.o. G2P0101 now PP day 1 s/p Vaginal, Spontaneous . Delivery was uncomplicated  Subjective  The patient has the following complaints: has no unusual complaints  Pain is controlled with current medications.   Patient is urinating without difficulty.  She is ambulating well.   Desires DC  Objective  Vital signs: BP 102/60 (BP Location: Right Arm)   Pulse 68   Temp 98.1 F (36.7 C) (Axillary)   Resp 18   Ht 5\' 3"  (1.6 m)   Wt 79.4 kg   LMP 02/28/2019   SpO2 97% Comment: Room Air  BMI 31.00 kg/m   Physical Exam: Gen: NAD Fundus Fundal Tone: Firm  Lochia Amount: Small  Perineum Appearance: Intact, Edematous     Data Review Labs: CBC Latest Ref Rng & Units 11/13/2019 09/14/2019 05/17/2019  WBC 4.0 - 10.5 K/uL 16.8(H) 15.6(H) 11.5(H)  Hemoglobin 12.0 - 15.0 g/dL 10.4(L) 11.2 12.6  Hematocrit 36.0 - 46.0 % 31.1(L) 33.2(L) 36.6  Platelets 150 - 400 K/uL 370 321 335   A POS  Assessment/Plan  Active Problems:   Pregnancy   Normal labor    Plan for discharge today.   Discharge Instructions: Per After Visit Summary. Activity: Advance as tolerated. Pelvic rest for 6 weeks.  Also refer to After Visit Summary Diet: Regular Medications: Allergies as of 11/14/2019   No Known Allergies     Medication List    STOP taking these medications   Makena 275 MG/1.1ML Soaj Generic drug: HYDROXYprogesterone Caproate     TAKE these medications   prenatal  multivitamin Tabs tablet Take 1 tablet by mouth daily at 12 noon.      Outpatient follow up:  Follow-up Information    01/12/2020, CNM Follow up in 6 week(s).   Specialties: Certified Nurse Midwife, Radiology Contact information: [address] [place] [phone]          Postpartum contraception: Will discuss at first office visit post-partum  Discharged Condition: good  Discharged to: home  Newborn Data: Disposition:home with mother  Apgars: APGAR (1 MIN): 9   APGAR (5 MINS): 9   APGAR (10 MINS):    Baby Feeding: Breast    [phone], M.D. 11/14/2019 8:36 AM

## 2019-11-14 NOTE — Progress Notes (Signed)
Pt discharged with infant.  Discharge instructions, prescriptions and follow up appointment given to and reviewed with pt. Pt verbalized understanding. Escorted out by staff. 

## 2019-11-15 LAB — RPR: RPR Ser Ql: NONREACTIVE

## 2019-11-16 ENCOUNTER — Encounter: Payer: Medicaid Other | Admitting: Certified Nurse Midwife

## 2019-11-23 ENCOUNTER — Encounter: Payer: Medicaid Other | Admitting: Certified Nurse Midwife

## 2019-11-30 ENCOUNTER — Encounter: Payer: Medicaid Other | Admitting: Certified Nurse Midwife

## 2019-12-07 ENCOUNTER — Encounter: Payer: Medicaid Other | Admitting: Certified Nurse Midwife

## 2020-11-10 NOTE — L&D Delivery Note (Signed)
       Delivery Note   Anabelle Bungert is a 25 y.o. [id number] at [redacted]w[redacted]d Estimated Date of Delivery: 10/15/21  PRE-OPERATIVE DIAGNOSIS:  1) [redacted]w[redacted]d pregnancy.   POST-OPERATIVE DIAGNOSIS:  1) [redacted]w[redacted]d pregnancy s/p Vaginal, Spontaneous    Delivery Type: Vaginal, Spontaneous    Delivery Anesthesia: None   Labor Complications:  none    ESTIMATED BLOOD LOSS: 150 ml    FINDINGS:   1) female infant, Apgar scores of    at 1 minute and    at 5 minutes and a birthweight of   ounces.    2) Nuchal cord: none  SPECIMENS:   PLACENTA:   Appearance: Intact , 3 vessel cord   Removal: Spontaneous      Disposition:  per protocol   DISPOSITION:  Infant to left in stable condition in the delivery room, with L&D personnel and mother,  NARRATIVE SUMMARY: Labor course:  Ms. Yomara Toothman is a [id number] at [redacted]w[redacted]d who presented for labor management.  She progressed well in labor without pitocin.  She received no anesthesia and proceeded to complete dilation. She evidenced good maternal expulsive effort during the second stage. She went on to deliver a viable female infant "Lendell Caprice". The placenta delivered without problems and was noted to be complete. A perineal and vaginal examination was performed. Lacerations: none.   The patient tolerated this well. Family at the bedside. Infant skin to skin.    [name], CNM  09/17/2021 3:27 AM

## 2020-12-06 ENCOUNTER — Other Ambulatory Visit: Payer: Self-pay

## 2020-12-06 ENCOUNTER — Emergency Department
Admission: EM | Admit: 2020-12-06 | Discharge: 2020-12-07 | Disposition: A | Discharge status: Home / Self Care | Payer: Medicaid Other | Attending: Emergency Medicine | Admitting: Emergency Medicine

## 2020-12-06 DIAGNOSIS — R197 Diarrhea, unspecified: Secondary | ICD-10-CM | POA: Diagnosis not present

## 2020-12-06 DIAGNOSIS — E860 Dehydration: Secondary | ICD-10-CM | POA: Insufficient documentation

## 2020-12-06 DIAGNOSIS — R109 Unspecified abdominal pain: Secondary | ICD-10-CM | POA: Diagnosis not present

## 2020-12-06 DIAGNOSIS — J029 Acute pharyngitis, unspecified: Secondary | ICD-10-CM | POA: Diagnosis not present

## 2020-12-06 DIAGNOSIS — R059 Cough, unspecified: Secondary | ICD-10-CM | POA: Diagnosis not present

## 2020-12-06 DIAGNOSIS — M791 Myalgia, unspecified site: Secondary | ICD-10-CM | POA: Diagnosis not present

## 2020-12-06 DIAGNOSIS — R112 Nausea with vomiting, unspecified: Secondary | ICD-10-CM | POA: Diagnosis not present

## 2020-12-06 DIAGNOSIS — R000 Tachycardia, unspecified: Secondary | ICD-10-CM | POA: Diagnosis not present

## 2020-12-06 DIAGNOSIS — Z20822 Contact with and (suspected) exposure to covid-19: Secondary | ICD-10-CM | POA: Diagnosis not present

## 2020-12-06 LAB — CBC WITH DIFFERENTIAL/PLATELET
Abs Immature Granulocytes: 0.07 10*3/uL (ref 0.00–0.07)
Basophils Absolute: 0 10*3/uL (ref 0.0–0.1)
Basophils Relative: 0 %
Eosinophils Absolute: 0.1 10*3/uL (ref 0.0–0.5)
Eosinophils Relative: 1 %
HCT: 44.9 % (ref 36.0–46.0)
Hemoglobin: 14.8 g/dL (ref 12.0–15.0)
Immature Granulocytes: 1 %
Lymphocytes Relative: 5 %
Lymphs Abs: 0.7 10*3/uL (ref 0.7–4.0)
MCH: 28.3 pg (ref 26.0–34.0)
MCHC: 33 g/dL (ref 30.0–36.0)
MCV: 85.9 fL (ref 80.0–100.0)
Monocytes Absolute: 1.1 10*3/uL — ABNORMAL HIGH (ref 0.1–1.0)
Monocytes Relative: 8 %
Neutro Abs: 12.8 10*3/uL — ABNORMAL HIGH (ref 1.7–7.7)
Neutrophils Relative %: 85 %
Platelets: 312 10*3/uL (ref 150–400)
RBC: 5.23 MIL/uL — ABNORMAL HIGH (ref 3.87–5.11)
RDW: 13.6 % (ref 11.5–15.5)
WBC: 14.9 10*3/uL — ABNORMAL HIGH (ref 4.0–10.5)
nRBC: 0 % (ref 0.0–0.2)

## 2020-12-06 LAB — TROPONIN I (HIGH SENSITIVITY): Troponin I (High Sensitivity): <2 ng/L (ref <18)

## 2020-12-06 LAB — BASIC METABOLIC PANEL
Anion gap: 14 (ref 5–15)
BUN: 11 mg/dL (ref 6–20)
CO2: 20 mmol/L — ABNORMAL LOW (ref 22–32)
Calcium: 9.6 mg/dL (ref 8.9–10.3)
Chloride: 104 mmol/L (ref 98–111)
Creatinine, Ser: 0.66 mg/dL (ref 0.44–1.00)
GFR, Estimated: >60 mL/min (ref >60)
Glucose, Bld: 99 mg/dL (ref 70–99)
Potassium: 4.1 mmol/L (ref 3.5–5.1)
Sodium: 138 mmol/L (ref 135–145)

## 2020-12-06 MED ORDER — DICYCLOMINE HCL 10 MG PO CAPS: 20.0000 mg | ORAL_CAPSULE | Freq: Once | ORAL | Status: DC

## 2020-12-06 MED ORDER — ACETAMINOPHEN 500 MG PO TABS
1000.0000 mg | ORAL_TABLET | Freq: Once | ORAL | Status: AC
  Administered 2020-12-06: 1000 mg via ORAL
  Filled 2020-12-06: qty 2

## 2020-12-06 MED ORDER — SODIUM CHLORIDE 0.9 % IV BOLUS (SEPSIS)
1000.0000 mL | Freq: Once | INTRAVENOUS | Status: AC
  Administered 2020-12-06: 1000 mL via INTRAVENOUS

## 2020-12-06 MED ORDER — ONDANSETRON HCL 4 MG/2ML IJ SOLN
4.0000 mg | Freq: Once | INTRAMUSCULAR | Status: AC
  Administered 2020-12-06: 4 mg via INTRAVENOUS
  Filled 2020-12-06: qty 2

## 2020-12-06 NOTE — ED Provider Notes (Signed)
[facility] Emergency Department Provider Note  ____________________________________________   Event Date/Time   First MD Initiated Contact with Patient 12/06/20 2253     (approximate)  I have reviewed the triage vital signs and the nursing notes.   HISTORY  Chief Complaint Sore Throat, Nausea, and Dizziness (/)    HPI Bonnie Graham is a 25 y.o. female with no significant PMH who presents to the ED with 2 days chills, sore throat, body aches, dry cough.  No current chest pain or SOB.  Vomiting and diarrhea started today.  Husband, son with similar symptoms.  Had 2 negative at home COVID tests in that past two days.  Not vaccinated for COVID 19.  No sick contacts, hospitalization, recent travel, abx use.  No dysuria, hematuria, vaginal discharge or bleeding.  LMP 11/15/20.  Patient nursing.  No breast pain or redness.  Has had some intermittent upper and lower abdominal sharp pains.     Past Medical History:  Diagnosis Date  . Heavy periods   . Painful menstrual periods     Patient Active Problem List   Diagnosis Date Noted  . Normal labor 11/14/2019  . Pregnancy 11/09/2019  . Labor and delivery, indication for care 11/04/2019  . 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 History of preterm delivery 06/17/2018  . Maternal varicella, non-immune 11/11/2017  . History of heavy periods 01/02/2017  . Dysmenorrhea 01/02/2017  . Family history of endometriosis in first degree relative 01/02/2017  . Family history of first-degree relative with cardiomyopathy 04/16/2016    Past Surgical History:  Procedure Laterality Date  . NO PAST SURGERIES      Prior to Admission medications   Medication Sig Start Date End Date Taking? Authorizing Provider  ondansetron (ZOFRAN ODT) 4 MG disintegrating tablet Take 1 tablet (4 mg total) by mouth every 6 (six) hours as needed for nausea or vomiting. 12/07/20  Yes Adrianna Dudas, Layla Maw, DO  Prenatal Vit-Fe Fumarate-FA (PRENATAL  MULTIVITAMIN) TABS tablet Take 1 tablet by mouth daily at 12 noon.    [provider]    Allergies Patient has no known allergies.  Family History  Problem Relation Age of Onset  . Heart disease Father   . Endometriosis Sister   . Endometriosis Maternal Grandmother        grt  . Breast cancer Neg Hx   . Ovarian cancer Neg Hx   . Colon cancer Neg Hx   . Diabetes Neg Hx     Social History Social History   Tobacco Use  . Smoking status: Never Smoker  . Smokeless tobacco: Never Used  Vaping Use  . Vaping Use: Never used  Substance Use Topics  . Alcohol use: No  . Drug use: No    Review of Systems Constitutional: + fever. Eyes: No visual changes. ENT: + sore throat. Cardiovascular: Denies chest pain. Respiratory: Denies shortness of breath. Gastrointestinal: + nausea, vomiting, diarrhea. Genitourinary: Negative for dysuria. Musculoskeletal: Negative for back pain. Skin: Negative for rash. Neurological: Negative for focal weakness or numbness.  ____________________________________________   PHYSICAL EXAM:  VITAL SIGNS: ED Triage Vitals  Enc Vitals Group     BP 12/06/20 2100 112/69     Pulse Rate 12/06/20 2100 (!) 123     Resp 12/06/20 2100 18     Temp 12/06/20 2100 98.8 F (37.1 C)     Temp Source 12/06/20 2100 Oral     SpO2 12/06/20 2100 100 %     Weight 12/06/20 2101  155 lb (70.3 kg)     Height 12/06/20 2101 5\' 3"  (1.6 m)     Head Circumference --      Peak Flow --      Pain Score 12/06/20 2100 3     Pain Loc --      Pain Edu? --      Excl. in GC? --    CONSTITUTIONAL: Alert and oriented and responds appropriately to questions. Well-appearing; well-nourished HEAD: Normocephalic EYES: Conjunctivae clear, pupils appear equal, EOM appear intact ENT: normal nose; moist mucous membranes; No pharyngeal erythema or petechiae, no tonsillar hypertrophy or exudate, no uvular deviation, no unilateral swelling, no trismus or drooling, no muffled voice,  normal phonation, no stridor. NECK: Supple, normal ROM CARD: regular and tachycardic; S1 and S2 appreciated; no murmurs, no clicks, no rubs, no gallops RESP: Normal chest excursion without splinting or tachypnea; breath sounds clear and equal bilaterally; no wheezes, no rhonchi, no rales, no hypoxia or respiratory distress, speaking full sentences ABD/GI: Normal bowel sounds; non-distended; soft, non-tender, no rebound, no guarding, no peritoneal signs, no hepatosplenomegaly BACK: The back appears normal EXT: Normal ROM in all joints; no deformity noted, no edema; no cyanosis SKIN: Normal color for age and race; warm; no rash on exposed skin NEURO: Moves all extremities equally PSYCH: The patient's mood and manner are appropriate.  ____________________________________________   LABS (all labs ordered are listed, but only abnormal results are displayed)  Labs Reviewed  CBC WITH DIFFERENTIAL/PLATELET - Abnormal; Notable for the following components:      Result Value   WBC 14.9 (*)    RBC 5.23 (*)    Neutro Abs 12.8 (*)    Monocytes Absolute 1.1 (*)    All other components within normal limits  BASIC METABOLIC PANEL - Abnormal; Notable for the following components:   CO2 20 (*)    All other components within normal limits  URINALYSIS, ROUTINE W REFLEX MICROSCOPIC - Abnormal; Notable for the following components:   Color, Urine YELLOW (*)    APPearance HAZY (*)    Hgb urine dipstick SMALL (*)    Ketones, ur 80 (*)    Protein, ur 30 (*)    All other components within normal limits  SARS CORONAVIRUS 2 (TAT 6-24 HRS)  HEPATIC FUNCTION PANEL  LIPASE, BLOOD  POC URINE PREG, ED  POC SARS CORONAVIRUS 2 AG -  ED  TROPONIN I (HIGH SENSITIVITY)   ____________________________________________  EKG   EKG Interpretation  Date/Time:  Thursday December 06 2020 22:56:59 EST Ventricular Rate:  118 PR Interval:    QRS Duration: 82 QT Interval:  337 QTC Calculation: 473 R  Axis:   81 Text Interpretation: Sinus tachycardia Consider left atrial enlargement RSR' in V1 or V2, probably normal variant Borderline T abnormalities, anterior leads Rate faster than previous Confirmed by [id number] [phone]) on 12/06/2020 11:01:40 PM       ____________________________________________  RADIOLOGY 12/08/2020 Reiley Keisler, personally viewed and evaluated these images (plain radiographs) as part of my medical decision making, as well as reviewing the written report by the radiologist.  ED MD interpretation:  none  Official radiology report(s): No results found.  ____________________________________________   PROCEDURES  Procedure(s) performed (including Critical Care):  None ____________________________________________   INITIAL IMPRESSION / ASSESSMENT AND PLAN / ED COURSE  As part of my medical decision making, I reviewed the following data within the electronic MEDICAL RECORD NUMBER Nursing notes reviewed and incorporated, Labs reviewed, EKG interpreted tachycardia, Old chart reviewed  and Notes from prior ED visits         Patient here with symptoms consistent with viral illness.  Suspect COVID-19.  She is tachycardic with dry mucous membranes on exam.  Will give IV fluids and encourage p.o. fluids.  Will treat symptoms with Tylenol, Zofran.  Labs, urine pending.  Will obtain rapid Covid test here.  Abdominal exam is benign.  Low suspicion for appendicitis, colitis, diverticulitis, bowel obstruction, pancreatitis, cholecystitis, choledocholithiasis, cholangitis, pyelonephritis.  Has been at home with similar symptoms.  She denies any chest pain or shortness of breath.  She has no respiratory distress, increased work of breathing or hypoxia here.  Plan is for symptomatic relief, reassessment and likely discharge home.     1:00 AM Pt's labs show leukocytosis of 14,000 with left shift but this does appear to be chronic for patient.  Electrolytes within normal limits.  Normal  creatinine, LFTs and lipase.  Troponin normal.  I do not feel she needs a repeat troponin given I feel ACS is unlikely as she denies having any active chest pain.  Her rapid Covid test is negative.  Will send 24-hour PCR test and give her instructions on how to follow-up on these results through MyChart.  Her vital signs have improved and she is no longer tachycardic.  She is tolerating p.o.  Urinalysis pending.  1:40 AM  Pt's urine does not appear infected.  Does show large ketones after 1 L of fluid.  Will give second liter here prior to discharge home.  She is tolerating p.o.  Her PCR Covid test is pending.  She knows how to follow-up on these results on MyChart.  Discussed with patient I suspect she has a viral illness, possible COVID-19 given her family members have similar symptoms.  Recommended supportive care instructions including alternating Tylenol, Motrin for pain and fever, over-the-counter Imodium for diarrhea.  Will discharge with prescription of Zofran.  She continues to be hemodynamically stable again without hypoxia, respiratory distress or increased work of breathing.  Patient comfortable with this plan.  Discussed return precautions.  At this time, I do not feel there is any life-threatening condition present. I have reviewed, interpreted and discussed all results (EKG, imaging, lab, urine as appropriate) and exam findings with patient/family. I have reviewed nursing notes and appropriate previous records.  I feel the patient is safe to be discharged home without further emergent workup and can continue workup as an outpatient as needed. Discussed usual and customary return precautions. Patient/family verbalize understanding and are comfortable with this plan.  Outpatient follow-up has been provided as needed. All questions have been answered.  ____________________________________________   FINAL CLINICAL IMPRESSION(S) / ED DIAGNOSES  Final diagnoses:  Nausea, vomiting and diarrhea   Dehydration     ED Discharge Orders         Ordered    ondansetron (ZOFRAN ODT) 4 MG disintegrating tablet  Every 6 hours PRN        12/07/20 0146          *Please note:  Jayani Rozman was evaluated in Emergency Department on 12/07/2020 for the symptoms described in the history of present illness. She was evaluated in the context of the global COVID-19 pandemic, which necessitated consideration that the patient might be at risk for infection with the SARS-CoV-2 virus that causes COVID-19. Institutional protocols and algorithms that pertain to the evaluation of patients at risk for COVID-19 are in a state of rapid change based on information released by  regulatory bodies including the CDC and federal and state organizations. These policies and algorithms were followed during the patient's care in the ED.  Some ED evaluations and interventions may be delayed as a result of limited staffing during and the pandemic.*   Note:  This document was prepared using Dragon voice recognition software and may include unintentional dictation errors.   Sayyid Harewood, Layla Maw, DO 12/07/20 [phone]

## 2020-12-06 NOTE — ED Triage Notes (Signed)
Pt reports weakness, dizziness, "like I'm about to pass out if I stand up a lot". Yesterday was coughing, dry throat. Nausea, vomiting x4 today, diarrhea x4 occurrences. Took at home covid tests x2 and both negative Denies shob. Has some stomach pain 3/10, intermittent.

## 2020-12-07 LAB — HEPATIC FUNCTION PANEL
ALT: 17 U/L (ref 0–44)
AST: 19 U/L (ref 15–41)
Albumin: 4.4 g/dL (ref 3.5–5.0)
Alkaline Phosphatase: 71 U/L (ref 38–126)
Bilirubin, Direct: <0.1 mg/dL (ref 0.0–0.2)
Total Bilirubin: 0.6 mg/dL (ref 0.3–1.2)
Total Protein: 7.9 g/dL (ref 6.5–8.1)

## 2020-12-07 LAB — URINALYSIS, ROUTINE W REFLEX MICROSCOPIC
Bacteria, UA: NONE SEEN
Bilirubin Urine: NEGATIVE
Glucose, UA: NEGATIVE mg/dL
Ketones, ur: 80 mg/dL — AB
Leukocytes,Ua: NEGATIVE
Nitrite: NEGATIVE
Protein, ur: 30 mg/dL — AB
Specific Gravity, Urine: 1.03 (ref 1.005–1.030)
pH: 5 (ref 5.0–8.0)

## 2020-12-07 LAB — POC SARS CORONAVIRUS 2 AG -  ED: SARS Coronavirus 2 Ag: NEGATIVE

## 2020-12-07 LAB — LIPASE, BLOOD: Lipase: 30 U/L (ref 11–51)

## 2020-12-07 LAB — POC URINE PREG, ED: Preg Test, Ur: NEGATIVE

## 2020-12-07 LAB — SARS CORONAVIRUS 2 (TAT 6-24 HRS): SARS Coronavirus 2: NEGATIVE

## 2020-12-07 MED ORDER — ONDANSETRON 4 MG PO TBDP
ORAL_TABLET | ORAL | Status: AC
  Administered 2020-12-07: 4 mg via ORAL
  Filled 2020-12-07: qty 1

## 2020-12-07 MED ORDER — ONDANSETRON 4 MG PO TBDP: 4.0000 mg | ORAL_TABLET | Freq: Four times a day (QID) | ORAL | 0 refills | Status: DC | PRN

## 2020-12-07 MED ORDER — SODIUM CHLORIDE 0.9 % IV BOLUS (SEPSIS)
1000.0000 mL | Freq: Once | INTRAVENOUS | Status: AC
  Administered 2020-12-07: 1000 mL via INTRAVENOUS

## 2020-12-07 MED ORDER — ONDANSETRON 4 MG PO TBDP: 4.0000 mg | ORAL_TABLET | Freq: Once | ORAL | Status: AC

## 2020-12-07 NOTE — Discharge Instructions (Signed)
You may alternate Tylenol 1000 mg every 6 hours as needed for pain, fever and Ibuprofen 800 mg every 8 hours as needed for pain, fever.  Please take Ibuprofen with food.  Do not take more than 4000 mg of Tylenol (acetaminophen) in a 24 hour period.  You may follow-up on your PCR COVID test on MyChart.  If you are positive for COVID-19, you will need to quarantine for 10 days after the onset of symptoms.  COVID-19 is a viral illness and would be treated the same as any other virus that can cause nausea, vomiting and diarrhea.  You may use over-the-counter Imodium as needed for diarrhea.  I recommend a bland diet for the next several days and increasing your fluid intake.  We are discharging with a prescription for Zofran to help with nausea and vomiting.

## 2021-01-14 ENCOUNTER — Telehealth: Payer: Self-pay

## 2021-01-14 NOTE — Telephone Encounter (Signed)
Pt c/o of abdominal pain x 1 week. Started at the beginning of your cycle and then at the end of her cycle.   Went to urgent- they advised they call her OB.   Pain is above the belly button.   Cycle is normal.  + for BF.   NO new sex partners.  No birth control.  No issues with BM or urination.   Advised to make an appt.

## 2021-01-18 ENCOUNTER — Ambulatory Visit (INDEPENDENT_AMBULATORY_CARE_PROVIDER_SITE_OTHER): Payer: Medicaid Other | Admitting: Certified Nurse Midwife

## 2021-01-18 ENCOUNTER — Encounter: Payer: Self-pay | Admitting: Certified Nurse Midwife

## 2021-01-18 ENCOUNTER — Other Ambulatory Visit: Payer: Self-pay

## 2021-01-18 VITALS — BP 128/75 | HR 79 | Ht 63.0 in | Wt 146.3 lb

## 2021-01-18 DIAGNOSIS — N946 Dysmenorrhea, unspecified: Secondary | ICD-10-CM | POA: Diagnosis not present

## 2021-01-18 DIAGNOSIS — Z842 Family history of other diseases of the genitourinary system: Secondary | ICD-10-CM

## 2021-01-18 NOTE — Progress Notes (Signed)
GYN ENCOUNTER NOTE  Subjective:       Bonnie Graham is a 25 y.o. [phone] female is here for gynecologic evaluation of the following issues:  1. Two (2) episodes of abdominal pain last several hours during last menstrual cycle; pain resolved with use of heating pad and tylenol  Family history significant for endometriosis.   Denies difficulty breathing or respiratory distress, chest pain, dysuria, and leg pain or swelling.    Gynecologic History  Patient's last menstrual period was 01/08/2021. Period Cycle (Days): 28 Period Duration (Days): 5-7 Period Pattern: Regular Menstrual Flow: Moderate,Heavy Menstrual Control: Tampon Menstrual Control Change Freq (Hours): 4 Dysmenorrhea: (!) Moderate Dysmenorrhea Symptoms: Cramping,Nausea   Contraception: none   Last Pap: due  Obstetric History  OB History  Gravida Para Term Preterm AB Living  3 2 0 2 0 2  SAB IAB Ectopic Multiple Live Births  0 0 0 0 2    # Outcome Date GA Lbr Len/2nd Weight Sex Delivery Anes PTL Lv  3 Preterm 11/13/19 [redacted]w[redacted]d  5 lb 1.8 oz (2.318 kg) M Vag-Spont  Y LIV  2 Preterm 05/08/18 [redacted]w[redacted]d  3 lb 1.9 oz (1.415 kg) M Vag-Spont EPI N LIV     Complications: Premature delivery  1 Gravida             Past Medical History:  Diagnosis Date  . Heavy periods   . Painful menstrual periods     Past Surgical History:  Procedure Laterality Date  . NO PAST SURGERIES      No Known Allergies  Social History   Socioeconomic History  . Marital status: Married    Spouse name: Trinna Post  . Number of children: Not on file  . Years of education: Not on file  . Highest education level: Not on file  Occupational History  . Not on file  Tobacco Use  . Smoking status: Never Smoker  . Smokeless tobacco: Never Used  Vaping Use  . Vaping Use: Never used  Substance and Sexual Activity  . Alcohol use: No  . Drug use: No  . Sexual activity: Yes    Birth control/protection: None    Comment: undecided  Other Topics  Concern  . Not on file  Social History Narrative  . Not on file   Social Determinants of Health   Financial Resource Strain: Not on file  Food Insecurity: Not on file  Transportation Needs: Not on file  Physical Activity: Not on file  Stress: Not on file  Social Connections: Not on file  Intimate Partner Violence: Not on file    Family History  Problem Relation Age of Onset  . Heart disease Father   . Endometriosis Sister   . Endometriosis Maternal Grandmother        grt  . Breast cancer Neg Hx   . Ovarian cancer Neg Hx   . Colon cancer Neg Hx   . Diabetes Neg Hx     The following portions of the patient's history were reviewed and updated as appropriate: allergies, current medications, past family history, past medical history, past social history, past surgical history and problem list.  Review of Systems  ROS negative except as noted above. Information obtained from patient.   Objective:   BP 128/75   Pulse 79   Ht 5\' 3"  (1.6 m)   Wt 146 lb 4.8 oz (66.4 kg)   BMI 25.92 kg/m    CONSTITUTIONAL: Well-developed, well-nourished female in no acute distress.   ABDOMEN:  Soft, non distended; Non tender.  No Organomegaly.  MUSCULOSKELETAL: Normal range of motion. No tenderness.  No cyanosis, clubbing, or edema.  Assessment:   1. Dysmenorrhea   2. Family history of endometriosis in first degree relative   Plan:   Declines hormonal options for management of symptoms at this time or referral to MD for endometriosis workup; desires pregnancy.   Herbs for Dysmenorrhea handout provided.   Reviewed red flag symptoms and when to call.   RTC if symptoms worsen or fail to improve.    Serafina Royals, CNM [facility], [facility] 01/18/21 5:14 PM

## 2021-01-18 NOTE — Progress Notes (Signed)
Abdominal pain- 2 days, happened at the beginning of period and end.  Pain scale 7 Pt states it caused doubling over pain, heating pad and tylenol helped after a few hours.

## 2021-01-18 NOTE — Patient Instructions (Signed)
Dysmenorrhea Dysmenorrhea refers to cramps caused by the muscles of the uterus tightening (contracting) during a menstrual period. Dysmenorrhea may be mild, or it may be severe enough to interfere with everyday activities for a few days each month. Primary dysmenorrhea is menstrual cramps that last a couple of days when a female starts having menstrual periods or soon after. As a female gets older or has a baby, the cramps will usually lessen or disappear. Secondary dysmenorrhea begins later in life and is caused by a disorder in the reproductive system. It lasts longer, and it may cause more pain than primary dysmenorrhea. The pain may start before the period and last a few days after the period. What are the causes? Dysmenorrhea is usually caused by an underlying problem, such as:  Endometriosis. The tissue that lines the uterus (endometrium) growing outside of the uterus in other areas of the body.  Adenomyosis. Endometrial tissue growing into the muscular walls of the uterus.  Pelvic congestive syndrome. Blood vessels in the pelvis that fill with blood just before the menstrual period.  Overgrowth of cells (polyps) in the endometrium or the lower part of the uterus (cervix).  Uterine prolapse. The uterus dropping down into the vagina due to stretched or weak muscles.  Bladder problems, such as infection or inflammation.  Intestinal problems, such as a tumor or irritable bowel syndrome.  Cancer of the reproductive organs or bladder. Other causes of this condition may result from:  A severely tipped uterus.  A cervix that is closed or has a small opening.  Noncancerous (benign) tumors in the uterus (fibroids).  Pelvic inflammatory disease (PID).  Pelvic scarring (adhesions) from a previous surgery.  An ovarian cyst.  An IUD (intrauterine device). What increases the risk? You are more likely to develop this condition if:  You are younger than 23 years old.  You started  puberty early.  You have irregular or heavy bleeding.  You have never given birth.  You have a family history of dysmenorrhea.  You smoke or use nicotine products.  You have high body weight or a low body weight. What are the signs or symptoms? Symptoms of this condition include:  Cramping, throbbing pain in lower abdomen or lower back, or a feeling of fullness in the lower abdomen.  Periods lasting for longer than 7 days.  Headaches.  Bloating.  Fatigue.  Nausea or vomiting.  Diarrhea or loose stools.  Sweating or dizziness. How is this diagnosed? This condition may be diagnosed based on:  Your symptoms.  Your medical history.  A physical exam.  Blood tests.  A Pap test. This is a test in which cells from the cervix are tested for signs of cancer or infection.  A pregnancy test. You may also have other tests, including:  Imaging tests, such as: ? Ultrasound. ? A procedure to remove and examine a sample of endometrial tissue (dilation and curettage, D&C). ? A procedure to visually examine the inside of:  The uterus (hysteroscopy).  The abdomen or pelvis (laparoscopy).  The bladder (cystoscopy). ? X-rays.  CT scan.  MRI. How is this treated? Treatment depends on the cause of the dysmenorrhea. Treatment may include medicines, such as:  Pain medicines.  Hormone replacement therapy. ? Injections of progesterone to stop the menstrual period. ? Birth control pills that contain the hormone progesterone. ? An IUD that contains the hormone progesterone.  NSAIDs, such as ibuprofen. These may help to stop the production of hormones that cause cramps.  Antidepressant   medicines. Other treatment may include:  Surgery to remove adhesions, endometriosis, ovarian cysts, fibroids, or the entire uterus (hysterectomy).  Endometrial ablation. This is a procedure to destroy the endometrium.  Presacral neurectomy. This is a procedure to cut the nerves in the  bottom of the spine (sacrum) that go to the reproductive organs.  Sacral nerve stimulation. This is a procedure to apply an electric current to nerves in the sacrum.  Exercise and physical therapy.  Meditation, yoga, and acupuncture. Work with your health care provider to determine what treatment or combination of treatments is best for you. Follow these instructions at home: Relieving pain and cramping  If directed, apply heat to your lower back or abdomen when you experience pain or cramps. Use the heat source that your health care provider recommends, such as a moist heat pack or a heating pad. ? Place a towel between your skin and the heat source. ? Leave the heat on for 20-30 minutes. ? Remove the heat if your skin turns bright red. This is especially important if you are unable to feel pain, heat, or cold. You may have a greater risk of getting burned.  Do not sleep with a heating pad on.  Exercise. Activities such as walking, swimming, or biking can help to relieve cramps.  Massage your lower back or abdomen to help relieve pain.   General instructions  Take over-the-counter and prescription medicines only as told by your health care provider.  Ask your health care provider if the medicine prescribed to you requires you to avoid driving or using machinery.  Avoid alcohol and caffeine during and right before your period. These can make cramps worse.  Do not use any products that contain nicotine or tobacco. These products include cigarettes, chewing tobacco, and vaping devices, such as e-cigarettes. If you need help quitting, ask your health care provider.  Keep all follow-up visits. This is important. Contact a health care provider if:  You have pain that gets worse or does not get better with medicine.  You have pain with sex.  You develop nausea or vomiting with your period that is not controlled with medicine. Get help right away if:  You  faint. Summary  Dysmenorrhea refers to cramps caused by the muscles of the uterus tightening (contracting) during a menstrual period.  Dysmenorrhea may be mild, or it may be severe enough to interfere with everyday activities for a few days each month.  Treatment depends on the cause of the dysmenorrhea.  Work with your health care provider to determine what treatment or combination of treatments is best for you. This information is not intended to replace advice given to you by your health care provider. Make sure you discuss any questions you have with your health care provider. Document Revised: 06/13/2020 Document Reviewed: 06/13/2020 Elsevier Patient Education  2021 [employer].   Endometriosis  Endometriosis is a condition in which a tissue similar to the endometrium grows in places outside the uterus. The endometrium is a tissue that forms the lining of the uterus. This tissue can grow in the organs that create the eggs (ovaries), the tubes that carry the eggs to the uterus (fallopian tubes), the vagina, and the bowel. This tissue most often grows on the ovaries and inner lining of the pelvic cavity (peritoneum). When the uterus sheds the endometrium every menstrual cycle, there is bleeding wherever these types of tissue are located. This can cause pain because blood is irritating to tissues that are not normally  exposed to it. Endometriosis can also make it harder for a woman to get pregnant. What are the causes? The cause of this condition is not known. What increases the risk? The following factors may make you more likely to develop this condition:  Having a family history of endometriosis.  Having never given birth.  Starting your period at 87 years of age or younger. What are the signs or symptoms? Often, there are no symptoms of this condition. If you do have symptoms, they may:  Vary depending on where the abnormal tissue is growing.  Occur during your menstrual  period (most often) or at the middle of your cycle.  Come and go. You may have no symptoms during some months.  Stop when you no longer have your monthly periods (menopause). Symptoms may include:  Pain in the area between your hip bones (pelvis).  Heavier bleeding during periods.  Menstrual periods that happen more than once a month.  Pain during sex.  Pain in the back or abdomen.  Painful bowel movements.  Not being able to get pregnant. How is this diagnosed? This condition is diagnosed based on your symptoms and a physical exam. You may have tests, such as:  Blood tests and urine tests to help rule out other causes.  Ultrasound to look for tissues that are not normal. This is often done over your skin. It is sometimes done through the vagina (transvaginal).  X-ray of the lower bowel (barium enema).  CT scan.  MRI. To confirm the diagnosis, your health care provider may use a device with a small camera to check tissue inside your abdomen (laparoscopy). Abnormal tissue may be removed and checked in a lab (biopsy). How is this treated? There is no cure for this condition. Treatment focuses on controlling your symptoms. The type of treatment also depends on whether you want to become pregnant in the future. This condition may be treated with:  Medicines. These may include: ? Medicines to relieve pain, including NSAIDs such as ibuprofen. ? Hormone therapy. This uses artificial hormones to slow the growth of the abnormal tissue. This may include hormonal birth control, such as pills.  Surgery to remove the abnormal tissue. During surgery: ? Tissue may be removed using a laparoscope and a laser (laparoscopic laser treatment). ? The fallopian tubes, uterus, and ovaries may be removed (hysterectomy). This is done in very severe cases. Follow these instructions at home:  Get regular exercise.  Limit alcohol use.  Eat a balanced diet.  Avoid caffeine.  Take  over-the-counter and prescription medicines only as told by your health care provider.  Keep all follow-up visits as told by your health care provider. This is important. Where to find more information  [employer]: GrandLunch.it  Office on Women's Health: https://www.[place].net/ Contact a health care provider if:  You are having new pain or trouble controlling pain.  You have problems getting pregnant.  You have a fever. Get help right away if you have:  Severe pain that does not get better with medicine.  Severe nausea and vomiting, or if you cannot eat or drink without vomiting.  Pain that affects your abdomen only on the lower, right side.  Pain in your abdomen that gets worse.  Swelling in your abdomen.  Blood in your stool (feces). Summary  Endometriosis is a condition in which a tissue similar to the endometrium grows in places outside the uterus. The endometrium is a tissue that forms the lining of  the uterus.  The cause of this condition is not known.  This condition may be treated with medicines to relieve pain, hormone therapy, or surgery.  If you have this condition, get regular exercise, limit alcohol use, and avoid caffeine.  Get help right away if you have severe pain that does not get better with medicine, or if you have severe nausea and vomiting or blood in your stool. This information is not intended to replace advice given to you by your health care provider. Make sure you discuss any questions you have with your health care provider. Document Revised: 12/14/2019 Document Reviewed: 12/14/2019 Elsevier Patient Education  2021 [employer].

## 2021-02-19 ENCOUNTER — Ambulatory Visit (INDEPENDENT_AMBULATORY_CARE_PROVIDER_SITE_OTHER): Payer: Medicaid Other | Admitting: Certified Nurse Midwife

## 2021-02-19 ENCOUNTER — Other Ambulatory Visit: Payer: Self-pay

## 2021-02-19 ENCOUNTER — Encounter: Payer: Self-pay | Admitting: Certified Nurse Midwife

## 2021-02-19 VITALS — BP 112/76 | HR 93 | Resp 16 | Ht 63.0 in | Wt 150.3 lb

## 2021-02-19 DIAGNOSIS — Z3200 Encounter for pregnancy test, result unknown: Secondary | ICD-10-CM

## 2021-02-19 LAB — POCT URINE PREGNANCY: Preg Test, Ur: POSITIVE — AB

## 2021-02-19 MED ORDER — PRENATAL VITAMIN/MIN +DHA 27-0.8-200 MG PO CAPS
1.0000 | ORAL_CAPSULE | Freq: Every day | ORAL | 11 refills | Status: AC
Start: 2021-02-19 — End: ?

## 2021-02-19 NOTE — Progress Notes (Signed)
Subjective:    Bonnie Graham is a 25 y.o. female who presents for evaluation of amenorrhea. She believes she could be pregnant. Pregnancy is desired. Sexual Activity: single partner, contraception: none. Current symptoms also include: fatigue. Last period was normal.   Patient's last menstrual period was 01/08/2021 (exact date). The following portions of the patient's history were reviewed and updated as appropriate: allergies, current medications, past family history, past medical history, past social history, past surgical history and problem list.  Review of Systems Pertinent items are noted in HPI.     Objective:    BP 112/76   Pulse 93   Resp 16   Ht 5\' 3"  (1.6 m)   Wt 150 lb 4.8 oz (68.2 kg)   LMP 01/08/2021 (Exact Date)   BMI 26.62 kg/m  General: alert, cooperative, appears stated age and no acute distress    Lab Review Urine HCG: positive    Assessment:    Absence of menstruation.     Plan:   Positive: EDC: 10/15/2021. Briefly discussed pre-natal care options.MD and midwifery care reviewed. Plan to see midwives. Encouraged well-balanced diet, plenty of rest when needed, pre-natal vitamins daily and walking for exercise. Discussed self-help for nausea, avoiding OTC medications until consulting provider or pharmacist, other than Tylenol as needed, minimal caffeine (1-2 cups daily) and avoiding alcohol. She will schedule u/s for dating, Nurse visit @ 10 wks and  her initial OB visit @ 12 wks. Feel free to call with any questions.   14/04/2021, CNM

## 2021-03-12 ENCOUNTER — Other Ambulatory Visit: Payer: Self-pay

## 2021-03-12 ENCOUNTER — Other Ambulatory Visit: Payer: Self-pay | Admitting: Certified Nurse Midwife

## 2021-03-12 ENCOUNTER — Ambulatory Visit
Admission: RE | Admit: 2021-03-12 | Discharge: 2021-03-12 | Disposition: A | Discharge status: Home / Self Care | Payer: Medicaid Other | Source: Ambulatory Visit | Attending: Certified Nurse Midwife | Admitting: Certified Nurse Midwife

## 2021-03-12 DIAGNOSIS — Z3200 Encounter for pregnancy test, result unknown: Secondary | ICD-10-CM | POA: Diagnosis not present

## 2021-03-22 ENCOUNTER — Ambulatory Visit (INDEPENDENT_AMBULATORY_CARE_PROVIDER_SITE_OTHER): Payer: Medicaid Other | Admitting: Surgical

## 2021-03-22 ENCOUNTER — Other Ambulatory Visit: Payer: Self-pay

## 2021-03-22 VITALS — BP 104/74 | HR 108 | Ht 63.0 in | Wt 149.2 lb

## 2021-03-22 DIAGNOSIS — Z3491 Encounter for supervision of normal pregnancy, unspecified, first trimester: Secondary | ICD-10-CM

## 2021-03-22 NOTE — Progress Notes (Signed)
Bonnie Graham presents for NOB nurse interview visit. Pregnancy confirmation done _4/12/2022__. G3. Y6415. Pregnancy education material explained and given. 0_ cats in home. NOB labs ordered.  HIV labs and drug screen were explained and ordered. PNV encouraged. Genetic screening options discussed. Genetic testing to be ordered at [facility]. Patient may discuss with the provider. Patient to follow up with provider on 04/02/2021 for NOB physical. All questions answered. FMLA and drug screen form explained and signed.

## 2021-03-22 NOTE — Patient Instructions (Signed)
Common Medications Safe in Pregnancy  Acne:      Constipation:  Benzoyl Peroxide     Colace  Clindamycin      Dulcolax Suppository  Topica Erythromycin     Fibercon  Salicylic Acid      Metamucil         Miralax AVOID:        Senakot   Accutane    Cough:  Retin-A       Cough Drops  Tetracycline      Phenergan w/ Codeine if Rx  Minocycline      Robitussin (Plain & DM)  Antibiotics:     Crabs/Lice:  Ceclor       RID  Cephalosporins    AVOID:  E-Mycins      Kwell  Keflex  Macrobid/Macrodantin   Diarrhea:  Penicillin      Kao-Pectate  Zithromax      Imodium AD         PUSH FLUIDS AVOID:       Cipro     Fever:  Tetracycline      Tylenol (Regular or Extra  Minocycline       Strength)  Levaquin      Extra Strength-Do not          Exceed 8 tabs/24 hrs Caffeine:        <200mg/day (equiv. To 1 cup of coffee or  approx. 3 12 oz sodas)         Gas: Cold/Hayfever:       Gas-X  Benadryl      Mylicon  Claritin       Phazyme  **Claritin-D        Chlor-Trimeton    Headaches:  Dimetapp      ASA-Free Excedrin  Drixoral-Non-Drowsy     Cold Compress  Mucinex (Guaifenasin)     Tylenol (Regular or Extra  Sudafed/Sudafed-12 Hour     Strength)  **Sudafed PE Pseudoephedrine   Tylenol Cold & Sinus     Vicks Vapor Rub  Zyrtec  **AVOID if Problems With Blood Pressure         Heartburn: Avoid lying down for at least 1 hour after meals  Aciphex      Maalox     Rash:  Milk of Magnesia     Benadryl    Mylanta       1% Hydrocortisone Cream  Pepcid  Pepcid Complete   Sleep Aids:  Prevacid      Ambien   Prilosec       Benadryl  Rolaids       Chamomile Tea  Tums (Limit 4/day)     Unisom         Tylenol PM         Warm milk-add vanilla or  Hemorrhoids:       Sugar for taste  Anusol/Anusol H.C.  (RX: Analapram 2.5%)  Sugar Substitutes:  Hydrocortisone OTC     Ok in moderation  Preparation H      Tucks        Vaseline lotion applied to tissue with  wiping    Herpes:     Throat:  Acyclovir      Oragel  Famvir  Valtrex     Vaccines:         Flu Shot Leg Cramps:       *Gardasil  Benadryl      Hepatitis A         Hepatitis B Nasal Spray:         Pneumovax  Saline Nasal Spray     Polio Booster         Tetanus Nausea:       Tuberculosis test or PPD  Vitamin B6 25 mg TID   AVOID:    Dramamine      *Gardasil  Emetrol       Live Poliovirus  Ginger Root 250 mg QID    MMR (measles, mumps &  High Complex Carbs @ Bedtime    rebella)  Sea Bands-Accupressure    Varicella (Chickenpox)  Unisom 1/2 tab TID     *No known complications           If received before Pain:         Known pregnancy;   Darvocet       Resume series after  Lortab        Delivery  Percocet    Yeast:   Tramadol      Femstat  Tylenol 3      Gyne-lotrimin  Ultram       Monistat  Vicodin           MISC:         All Sunscreens           Hair Coloring/highlights          Insect Repellant's          (Including DEET)         Mystic Tans    https://www.cdc.gov/pregnancy/infections.html">  First Trimester of Pregnancy  The first trimester of pregnancy starts on the first day of your last menstrual period until the end of week 12. This is also called months 1 through 3 of pregnancy. Body changes during your first trimester Your body goes through many changes during pregnancy. The changes usually return to normal after your baby is born. Physical changes  You may gain or lose weight.  Your breasts may grow larger and hurt. The area around your nipples may get darker.  Dark spots or blotches may develop on your face.  You may have changes in your hair. Health changes  You may feel like you might vomit (nauseous), and you may vomit.  You may have heartburn.  You may have headaches.  You may have trouble pooping (constipation).  Your gums may bleed. Other changes  You may get tired easily.  You may pee (urinate) more often.  Your menstrual periods will  stop.  You may not feel hungry.  You may want to eat certain kinds of food.  You may have changes in your emotions from day to day.  You may have more dreams. Follow these instructions at home: Medicines  Take over-the-counter and prescription medicines only as told by your doctor. Some medicines are not safe during pregnancy.  Take a prenatal vitamin that contains at least 600 micrograms (mcg) of folic acid. Eating and drinking  Eat healthy meals that include: ? Fresh fruits and vegetables. ? Whole grains. ? Good sources of protein, such as meat, eggs, or tofu. ? Low-fat dairy products.  Avoid raw meat and unpasteurized juice, milk, and cheese.  If you feel like you may vomit, or you vomit: ? Eat 4 or 5 small meals a day instead of 3 large meals. ? Try eating a few soda crackers. ? Drink liquids between meals instead of during meals.  You may need to take these actions to prevent or treat trouble pooping: ? Drink enough fluids to keep your pee (urine) pale yellow. ?   Eat foods that are high in fiber. These include beans, whole grains, and fresh fruits and vegetables. ? Limit foods that are high in fat and sugar. These include fried or sweet foods. Activity  Exercise only as told by your doctor. Most people can do their usual exercise routine during pregnancy.  Stop exercising if you have cramps or pain in your lower belly (abdomen) or low back.  Do not exercise if it is too hot or too humid, or if you are in a place of great height (high altitude).  Avoid heavy lifting.  If you choose to, you may have sex unless your doctor tells you not to. Relieving pain and discomfort  Wear a good support bra if your breasts are sore.  Rest with your legs raised (elevated) if you have leg cramps or low back pain.  If you have bulging veins (varicose veins) in your legs: ? Wear support hose as told by your doctor. ? Raise your feet for 15 minutes, 3-4 times a day. ? Limit salt  in your food. Safety  Wear your seat belt at all times when you are in a car.  Talk with your doctor if someone is hurting you or yelling at you.  Talk with your doctor if you are feeling sad or have thoughts of hurting yourself. Lifestyle  Do not use hot tubs, steam rooms, or saunas.  Do not douche. Do not use tampons or scented sanitary pads.  Do not use herbal medicines, illegal drugs, or medicines that are not approved by your doctor. Do not drink alcohol.  Do not smoke or use any products that contain nicotine or tobacco. If you need help quitting, ask your doctor.  Avoid cat litter boxes and soil that is used by cats. These carry germs that can cause harm to the baby and can cause a loss of your baby by miscarriage or stillbirth. General instructions  Keep all follow-up visits. This is important.  Ask for help if you need counseling or if you need help with nutrition. Your doctor can give you advice or tell you where to go for help.  Visit your dentist. At home, brush your teeth with a soft toothbrush. Floss gently.  Write down your questions. Take them to your prenatal visits. Where to find more information  American Pregnancy Association: americanpregnancy.org  American College of Obstetricians and Gynecologists: www.acog.org  Office on Women's Health: womenshealth.gov/pregnancy Contact a doctor if:  You are dizzy.  You have a fever.  You have mild cramps or pressure in your lower belly.  You have a nagging pain in your belly area.  You continue to feel like you may vomit, you vomit, or you have watery poop (diarrhea) for 24 hours or longer.  You have a bad-smelling fluid coming from your vagina.  You have pain when you pee.  You are exposed to a disease that spreads from person to person, such as chickenpox, measles, Zika virus, HIV, or hepatitis. Get help right away if:  You have spotting or bleeding from your vagina.  You have very bad belly cramping  or pain.  You have shortness of breath or chest pain.  You have any kind of injury, such as from a fall or a car crash.  You have new or increased pain, swelling, or redness in an arm or leg. Summary  The first trimester of pregnancy starts on the first day of your last menstrual period until the end of week 12 (months 1 through   3).  Eat 4 or 5 small meals a day instead of 3 large meals.  Do not smoke or use any products that contain nicotine or tobacco. If you need help quitting, ask your doctor.  Keep all follow-up visits. This information is not intended to replace advice given to you by your health care provider. Make sure you discuss any questions you have with your health care provider. Document Revised: 04/04/2020 Document Reviewed: 02/09/2020 Elsevier Patient Education  2021 Elsevier Inc.  

## 2021-03-23 LAB — URINALYSIS, ROUTINE W REFLEX MICROSCOPIC
Bilirubin, UA: NEGATIVE
Glucose, UA: NEGATIVE
Leukocytes,UA: NEGATIVE
Nitrite, UA: NEGATIVE
RBC, UA: NEGATIVE
Specific Gravity, UA: >=1.03 — AB (ref 1.005–1.030)
Urobilinogen, Ur: 1 mg/dL (ref 0.2–1.0)
pH, UA: 5.5 (ref 5.0–7.5)

## 2021-03-24 LAB — GC/CHLAMYDIA PROBE AMP
Chlamydia trachomatis, NAA: NEGATIVE
Neisseria Gonorrhoeae by PCR: NEGATIVE

## 2021-03-24 LAB — CULTURE, OB URINE

## 2021-03-24 LAB — URINE CULTURE, OB REFLEX

## 2021-03-26 LAB — ABO AND RH: Rh Factor: POSITIVE

## 2021-03-26 LAB — MONITOR DRUG PROFILE 14(MW)
Amphetamine Scrn, Ur: NEGATIVE ng/mL
BARBITURATE SCREEN URINE: NEGATIVE ng/mL
BENZODIAZEPINE SCREEN, URINE: NEGATIVE ng/mL
Buprenorphine, Urine: NEGATIVE ng/mL
CANNABINOIDS UR QL SCN: NEGATIVE ng/mL
Cocaine (Metab) Scrn, Ur: NEGATIVE ng/mL
Creatinine(Crt), U: 191.9 mg/dL (ref 20.0–300.0)
Fentanyl, Urine: NEGATIVE pg/mL
Meperidine Screen, Urine: NEGATIVE ng/mL
Methadone Screen, Urine: NEGATIVE ng/mL
OXYCODONE+OXYMORPHONE UR QL SCN: NEGATIVE ng/mL
Opiate Scrn, Ur: NEGATIVE ng/mL
Ph of Urine: 5.8 (ref 4.5–8.9)
Phencyclidine Qn, Ur: NEGATIVE ng/mL
Propoxyphene Scrn, Ur: NEGATIVE ng/mL
SPECIFIC GRAVITY: 1.023
Tramadol Screen, Urine: NEGATIVE ng/mL

## 2021-03-26 LAB — RUBELLA SCREEN: Rubella Antibodies, IGG: 1.07 index (ref >0.99)

## 2021-03-26 LAB — RPR: RPR Ser Ql: NONREACTIVE

## 2021-03-26 LAB — ANTIBODY SCREEN: Antibody Screen: NEGATIVE

## 2021-03-26 LAB — VARICELLA ZOSTER ANTIBODY, IGG: Varicella zoster IgG: <135 index — ABNORMAL LOW (ref >165)

## 2021-03-26 LAB — VIRAL HEPATITIS HBV, HCV
HCV Ab: <0.1 s/co ratio (ref 0.0–0.9)
Hep B Core Total Ab: NEGATIVE
Hep B Surface Ab, Qual: NONREACTIVE
Hepatitis B Surface Ag: NEGATIVE

## 2021-03-26 LAB — HCV INTERPRETATION

## 2021-03-26 LAB — HIV ANTIBODY (ROUTINE TESTING W REFLEX): HIV Screen 4th Generation wRfx: NONREACTIVE

## 2021-03-28 IMAGING — US US OB COMP +14 WK
1 of 2 series · 13 of 28 positions shown · non-contrast
Comparison: none

CLINICAL DATA: Fetal anatomy evaluation.

EXAM:
OBSTETRICAL ULTRASOUND >14 WKS

[Series 1: us ob comp +14 wk · 13 of 70 slices shown]
[im 3/70]
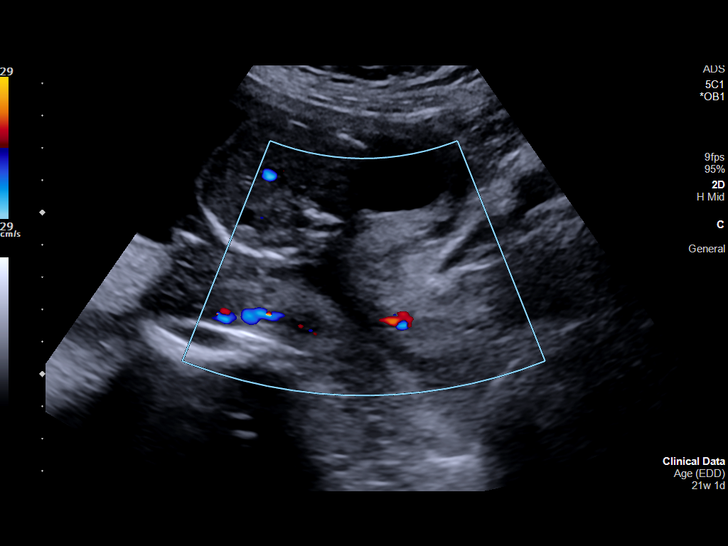
[im 8/70]
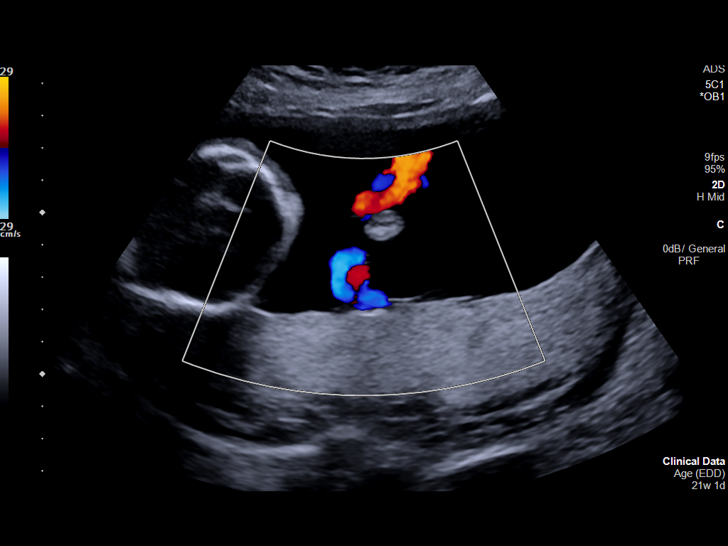
[im 14/70]
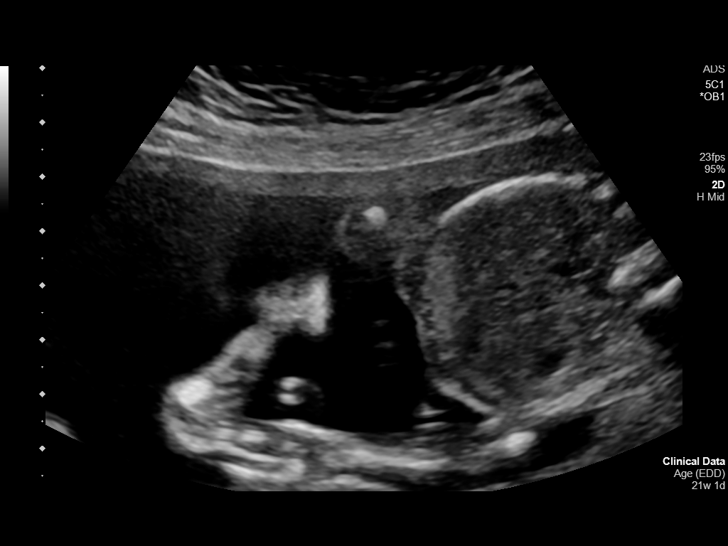
[im 19/70]
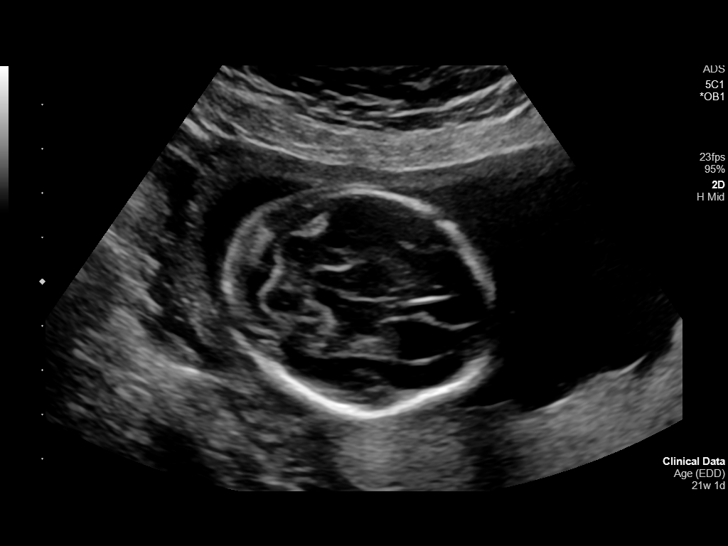
[im 24/70]
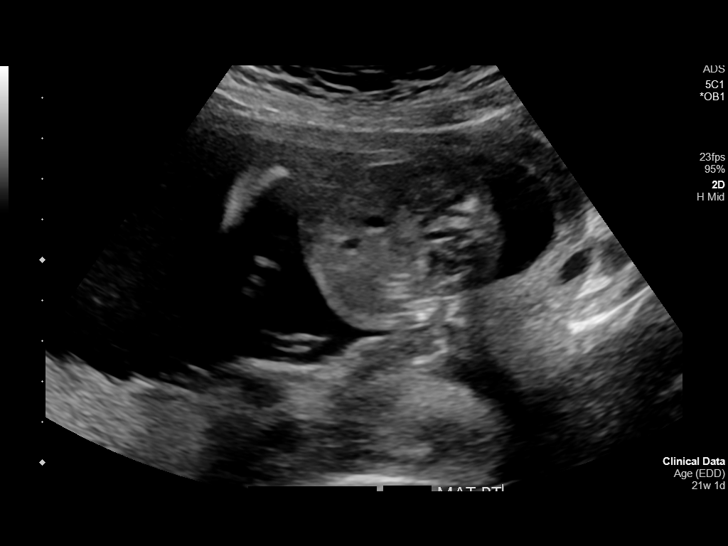
[im 30/70]
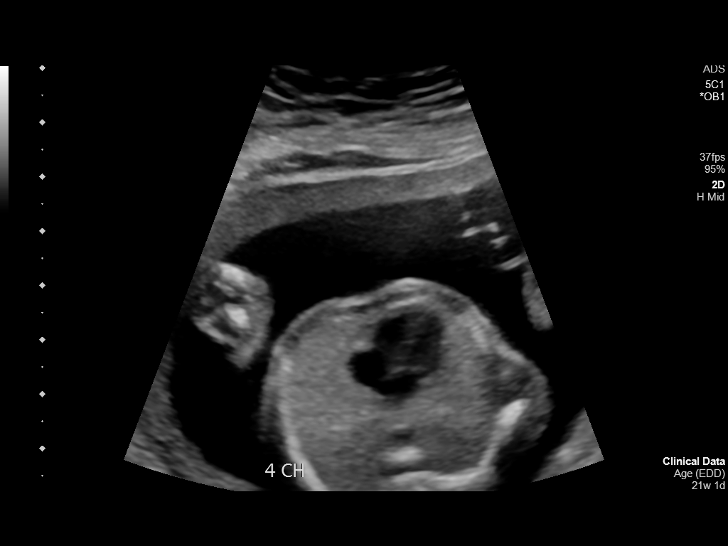
[im 38/70]
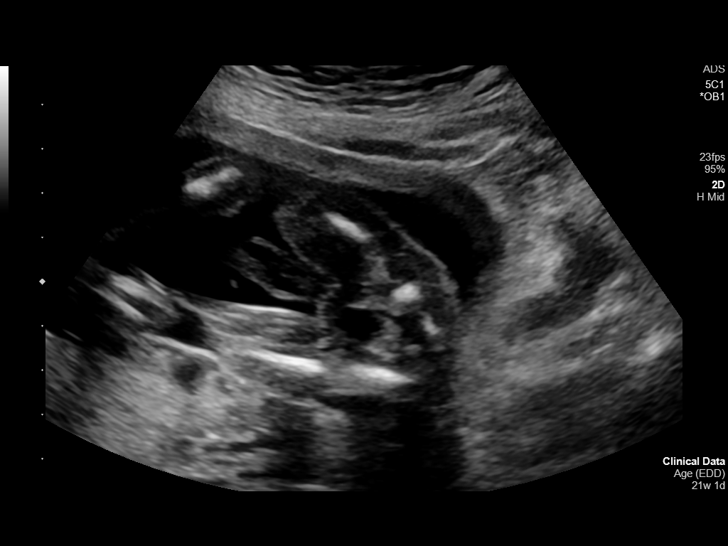
[im 43/70]
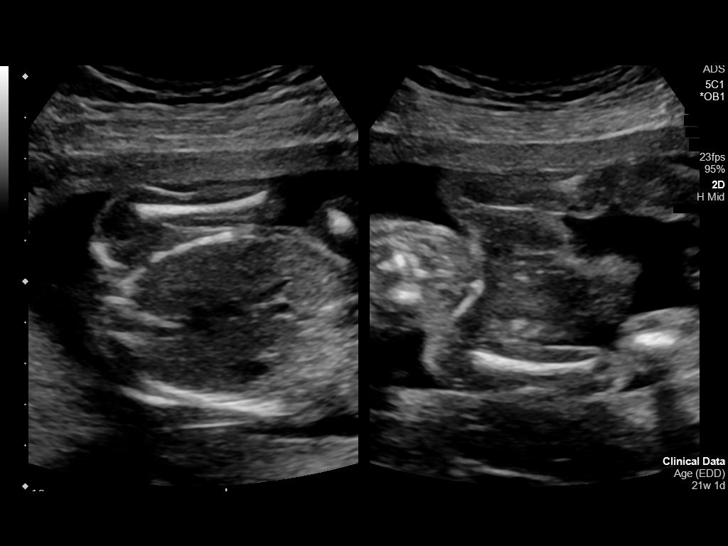
[im 48/70]
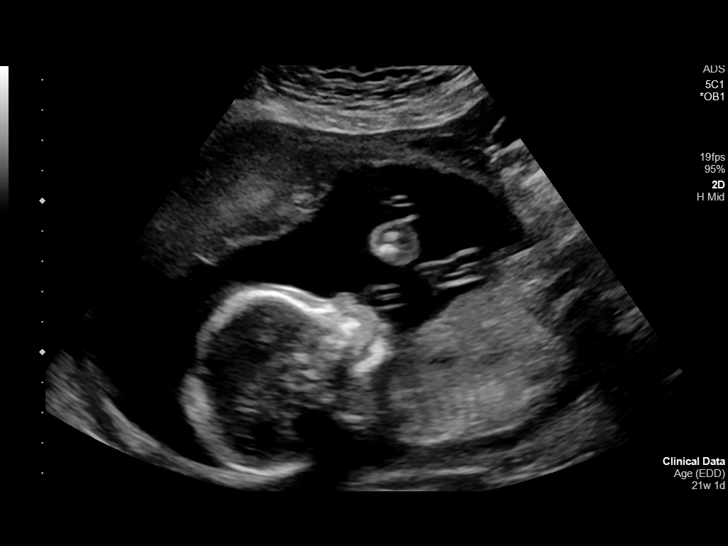
[im 54/70]
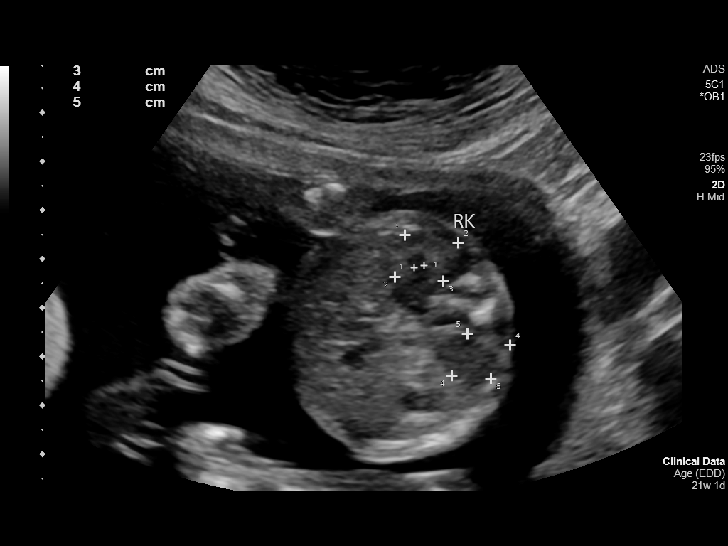
[im 59/70]
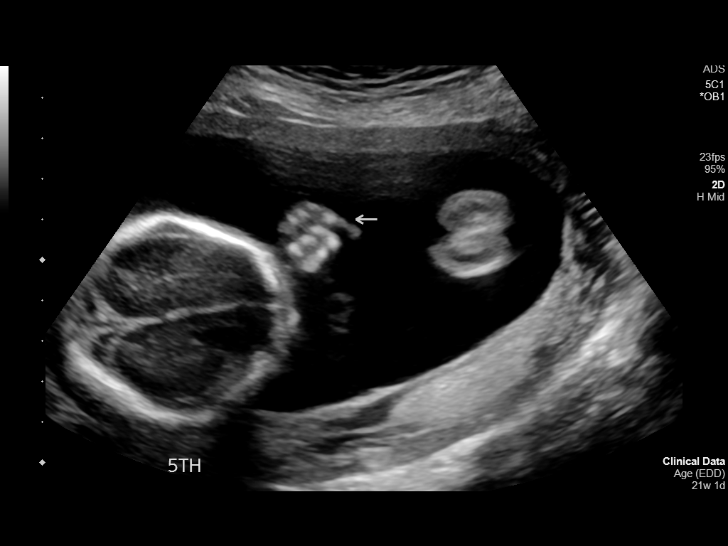
[im 64/70]
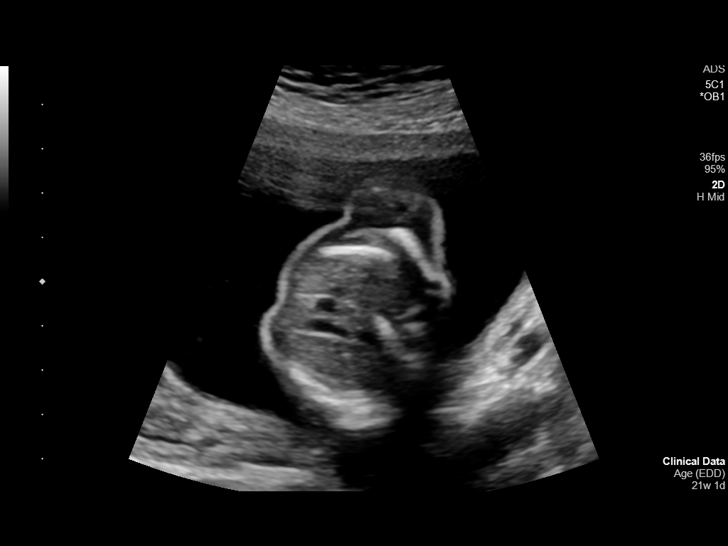
[im 70/70]
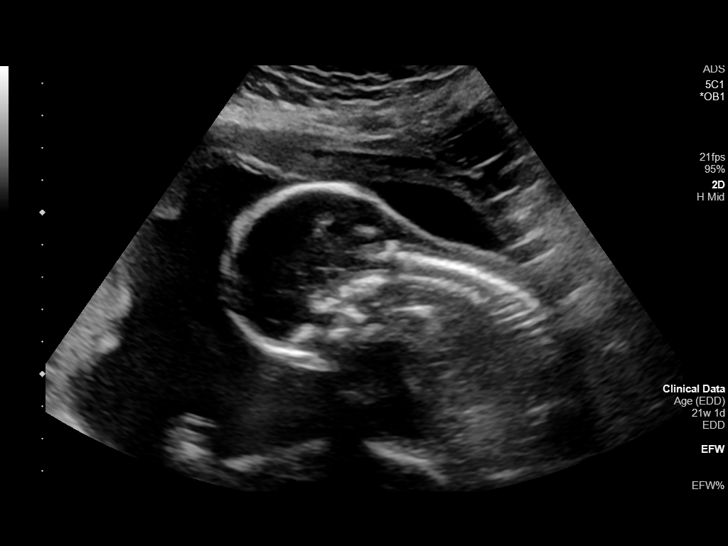

[13 of 28 positions shown; findings below may reference images not displayed]

FINDINGS: Number of Fetuses: 1

Heart Rate:  145 bpm

Movement: Present

Presentation: Transverse

Previa: No

Placental Location: Posterior fundal

Amniotic Fluid (Subjective): Normal

FETAL BIOMETRY

BPD: 5.1cm 21w 2d

HC:   18.5cm 20w 6d

AC:   15.5cm 20w 5d

FL:   3.3cm 20w 1d

Current Mean GA: 20w 4d US EDC: 12/08/2018

Assigned GA: 21w 1d            assigned EDC: 12/04/2018

FETAL ANATOMY

Lateral Ventricles: Appears normal

Thalami/CSP: Appears normal

Posterior Fossa:  Appears normal

Nuchal Region: Appears normal

Upper Lip: Appears normal

Spine: Not well visualized

4 Chamber Heart on Left: Appears normal

LVOT: Appears normal

RVOT: Appears normal

Stomach on Left: Appears normal

3 Vessel Cord: Appears normal

Cord Insertion site: Appears normal

Kidneys: Appears normal

Bladder: Appears normal

Extremities: Appears normal

Technically difficult due to: Fetal position

Maternal Findings:

Cervix:  3.4 cm and closed
IMPRESSION: Single viable intrauterine pregnancy at 20 weeks 4 days. Transverse
position.

## 2021-04-02 ENCOUNTER — Encounter: Payer: Self-pay | Admitting: Certified Nurse Midwife

## 2021-04-02 ENCOUNTER — Other Ambulatory Visit (HOSPITAL_COMMUNITY)
Admission: RE | Admit: 2021-04-02 | Discharge: 2021-04-02 | Disposition: A | Discharge status: Home / Self Care | Payer: Medicaid Other | Source: Ambulatory Visit | Attending: Certified Nurse Midwife | Admitting: Certified Nurse Midwife

## 2021-04-02 ENCOUNTER — Other Ambulatory Visit: Payer: Self-pay

## 2021-04-02 ENCOUNTER — Ambulatory Visit (INDEPENDENT_AMBULATORY_CARE_PROVIDER_SITE_OTHER): Payer: Medicaid Other | Admitting: Certified Nurse Midwife

## 2021-04-02 VITALS — BP 111/73 | HR 88 | Wt 149.6 lb

## 2021-04-02 DIAGNOSIS — Z3401 Encounter for supervision of normal first pregnancy, first trimester: Secondary | ICD-10-CM | POA: Diagnosis not present

## 2021-04-02 DIAGNOSIS — Z124 Encounter for screening for malignant neoplasm of cervix: Secondary | ICD-10-CM

## 2021-04-02 DIAGNOSIS — Z3A12 12 weeks gestation of pregnancy: Secondary | ICD-10-CM | POA: Diagnosis not present

## 2021-04-02 LAB — POCT URINALYSIS DIPSTICK OB
Bilirubin, UA: NEGATIVE
Blood, UA: POSITIVE
Glucose, UA: NEGATIVE
Ketones, UA: NEGATIVE
Leukocytes, UA: NEGATIVE
Nitrite, UA: NEGATIVE
POC,PROTEIN,UA: NEGATIVE
Spec Grav, UA: 1.025 (ref 1.010–1.025)
Urobilinogen, UA: 0.2 E.U./dL
pH, UA: 6 (ref 5.0–8.0)

## 2021-04-02 NOTE — Patient Instructions (Signed)
https://www.acog.org/womens-health/faqs/prenatal-genetic-screening-tests">  Prenatal Care Prenatal care is health care during pregnancy. It helps you and your unborn baby (fetus) stay as healthy as possible. Prenatal care may be provided by a midwife, a family practice doctor, a mid-level practitioner (nurse practitioner or physician assistant), or a childbirth and pregnancy doctor (obstetrician). How does this affect me? During pregnancy, you will be closely monitored for any new conditions that might develop. To lower your risk of pregnancy complications, you and your health care provider will talk about any underlying conditions you have. How does this affect my baby? Early and consistent prenatal care increases the chance that your baby will be healthy during pregnancy. Prenatal care lowers the risk that your baby will be:  Born early (prematurely).  Smaller than expected at birth (small for gestational age). What can I expect at the first prenatal care visit? Your first prenatal care visit will likely be the longest. You should schedule your first prenatal care visit as soon as you know that you are pregnant. Your first visit is a good time to talk about any questions or concerns you have about pregnancy. Medical history At your visit, you and your health care provider will talk about your medical history, including:  Any past pregnancies.  Your family's medical history.  Medical history of the baby's father.  Any long-term (chronic) health conditions you have and how you manage them.  Any surgeries or procedures you have had.  Any current over-the-counter or prescription medicines, herbs, or supplements that you are taking.  Other factors that could pose a risk to your baby, including: ? Exposure to harmful chemicals or radiation at work or at home. ? Any substance use, including tobacco, alcohol, and drug use.  Your home setting and your stress levels, including: ? Exposure to  abuse or violence. ? Household financial strain.  Your daily health habits, including diet and exercise. Tests and screenings Your health care provider will:  Measure your weight, height, and blood pressure.  Do a physical exam, including a pelvic and breast exam.  Perform blood tests and urine tests to check for: ? Urinary tract infection. ? Sexually transmitted infections (STIs). ? Low iron levels in your blood (anemia). ? Blood type and certain proteins on red blood cells (Rh antibodies). ? Infections and immunity to viruses, such as hepatitis B and rubella. ? HIV (human immunodeficiency virus).  Discuss your options for genetic screening. Tips about staying healthy Your health care provider will also give you information about how to keep yourself and your baby healthy, including:  Nutrition and taking vitamins.  Physical activity.  How to manage pregnancy symptoms such as nausea and vomiting (morning sickness).  Infections and substances that may be harmful to your baby and how to avoid them.  Food safety.  Dental care.  Working.  Travel.  Warning signs to watch for and when to call your health care provider. How often will I have prenatal care visits? After your first prenatal care visit, you will have regular visits throughout your pregnancy. The visit schedule is often as follows:  Up to week 28 of pregnancy: once every 4 weeks.  28-36 weeks: once every 2 weeks.  After 36 weeks: every week until delivery. Some women may have visits more or less often depending on any underlying health conditions and the health of the baby. Keep all follow-up and prenatal care visits. This is important. What happens during routine prenatal care visits? Your health care provider will:  Measure your weight   and blood pressure.  Check for fetal heart sounds.  Measure the height of your uterus in your abdomen (fundal height). This may be measured starting around week 20 of  pregnancy.  Check the position of your baby inside your uterus.  Ask questions about your diet, sleeping patterns, and whether you can feel the baby move.  Review warning signs to watch for and signs of labor.  Ask about any pregnancy symptoms you are having and how you are dealing with them. Symptoms may include: ? Headaches. ? Nausea and vomiting. ? Vaginal discharge. ? Swelling. ? Fatigue. ? Constipation. ? Changes in your vision. ? Feeling persistently sad or anxious. ? Any discomfort, including back or pelvic pain. ? Bleeding or spotting. Make a list of questions to ask your health care provider at your routine visits.   What tests might I have during prenatal care visits? You may have blood, urine, and imaging tests throughout your pregnancy, such as:  Urine tests to check for glucose, protein, or signs of infection.  Glucose tests to check for a form of diabetes that can develop during pregnancy (gestational diabetes mellitus). This is usually done around week 24 of pregnancy.  Ultrasounds to check your baby's growth and development, to check for birth defects, and to check your baby's well-being. These can also help to decide when you should deliver your baby.  A test to check for group B strep (GBS) infection. This is usually done around week 36 of pregnancy.  Genetic testing. This may include blood, fluid, or tissue sampling, or imaging tests, such as an ultrasound. Some genetic tests are done during the first trimester and some are done during the second trimester. What else can I expect during prenatal care visits? Your health care provider may recommend getting certain vaccines during pregnancy. These may include:  A yearly flu shot (annual influenza vaccine). This is especially important if you will be pregnant during flu season.  Tdap (tetanus, diphtheria, pertussis) vaccine. Getting this vaccine during pregnancy can protect your baby from whooping cough  (pertussis) after birth. This vaccine may be recommended between weeks 27 and 36 of pregnancy.  A COVID-19 vaccine. Later in your pregnancy, your health care provider may give you information about:  Childbirth and breastfeeding classes.  Choosing a health care provider for your baby.  Umbilical cord banking.  Breastfeeding.  Birth control after your baby is born.  The hospital labor and delivery unit and how to set up a tour.  Registering at the hospital before you go into labor. Where to find more information  Office on Women's Health: womenshealth.gov  American Pregnancy Association: americanpregnancy.org  March of Dimes: marchofdimes.org Summary  Prenatal care helps you and your baby stay as healthy as possible during pregnancy.  Your first prenatal care visit will most likely be the longest.  You will have visits and tests throughout your pregnancy to monitor your health and your baby's health.  Bring a list of questions to your visits to ask your health care provider.  Make sure to keep all follow-up and prenatal care visits. This information is not intended to replace advice given to you by your health care provider. Make sure you discuss any questions you have with your health care provider. Document Revised: 08/09/2020 Document Reviewed: 08/09/2020 Elsevier Patient Education  2021 Elsevier Inc.  

## 2021-04-02 NOTE — Progress Notes (Signed)
Patient here for New OB Physical. She is doing well no concerns today.

## 2021-04-02 NOTE — Progress Notes (Signed)
NEW OB HISTORY AND PHYSICAL  SUBJECTIVE:       Bonnie Graham is a 25 y.o. [id number] female, Patient's last menstrual period was 01/08/2021 (exact date)., Estimated Date of Delivery: 10/15/21, [redacted]w[redacted]d, presents today for establishment of Prenatal Care. She has no unusual complaints. She has history of preterm delivery at 33 wks & 36 wks. Used Eilene Ghazi with the last pregnancy and is would like to do it again.   Social Married with 2 children Work: stay at home mom Exercise: none Denies alcohol, drugs and smoking.    Gynecologic History Patient's last menstrual period was 01/08/2021 (exact date). Normal Contraception: none Last Pap: has not had   Obstetric History OB History  Gravida Para Term Preterm AB Living  3 2 0 2 0 2  SAB IAB Ectopic Multiple Live Births  0 0 0 0 2    # Outcome Date GA Lbr Len/2nd Weight Sex Delivery Anes PTL Lv  3 Current           2 Preterm 11/13/19 [redacted]w[redacted]d  5 lb 1.8 oz (2.318 kg) M Vag-Spont  Y LIV  1 Preterm 05/08/18 [redacted]w[redacted]d  3 lb 1.9 oz (1.415 kg) M Vag-Spont EPI N LIV     Complications: Premature delivery    Past Medical History:  Diagnosis Date  . Heavy periods   . Painful menstrual periods     Past Surgical History:  Procedure Laterality Date  . NO PAST SURGERIES      Current Outpatient Medications on File Prior to Visit  Medication Sig Dispense Refill  . Prenatal Vit-Fe Sulfate-FA-DHA (PRENATAL VITAMIN/MIN +DHA) 27-0.8-200 MG CAPS Take 1 tablet by mouth daily. 30 capsule 11   No current facility-administered medications on file prior to visit.    No Known Allergies  Social History   Socioeconomic History  . Marital status: Married    Spouse name: Trinna Post  . Number of children: Not on file  . Years of education: Not on file  . Highest education level: Not on file  Occupational History  . Not on file  Tobacco Use  . Smoking status: Never Smoker  . Smokeless tobacco: Never Used  Vaping Use  . Vaping Use: Never used  Substance and  Sexual Activity  . Alcohol use: No  . Drug use: No  . Sexual activity: Yes    Birth control/protection: None    Comment: undecided  Other Topics Concern  . Not on file  Social History Narrative  . Not on file   Social Determinants of Health   Financial Resource Strain: Not on file  Food Insecurity: Not on file  Transportation Needs: Not on file  Physical Activity: Not on file  Stress: Not on file  Social Connections: Not on file  Intimate Partner Violence: Not on file    Family History  Problem Relation Age of Onset  . Heart disease Father   . Endometriosis Sister   . Endometriosis Maternal Grandmother        grt  . Breast cancer Neg Hx   . Ovarian cancer Neg Hx   . Colon cancer Neg Hx   . Diabetes Neg Hx     The following portions of the patient's history were reviewed and updated as appropriate: allergies, current medications, past OB history, past medical history, past surgical history, past family history, past social history, and problem list.    OBJECTIVE: Initial Physical Exam (New OB)  GENERAL APPEARANCE: alert, well appearing, in no apparent distress, oriented to person,  place and time HEAD: normocephalic, atraumatic MOUTH: mucous membranes moist, pharynx normal without lesions THYROID: no thyromegaly or masses present BREASTS: no masses noted, no significant tenderness, no palpable axillary nodes, no skin changes, lactating LUNGS: clear to auscultation, no wheezes, rales or rhonchi, symmetric air entry HEART: regular rate and rhythm, no murmurs ABDOMEN: soft, nontender, nondistended, no abnormal masses, no epigastric pain and FHT present EXTREMITIES: no redness or tenderness in the calves or thighs, no edema, no limitation in range of motion SKIN: normal coloration and turgor, no rashes LYMPH NODES: no adenopathy palpable NEUROLOGIC: alert, oriented, normal speech, no focal findings or movement disorder noted  PELVIC EXAM EXTERNAL GENITALIA: normal  appearing vulva with no masses, tenderness or lesions VAGINA: no abnormal discharge or lesions CERVIX: no lesions or cervical motion tenderness UTERUS: gravid ADNEXA: no masses palpable and nontender OB EXAM PELVIMETRY: appears adequate RECTUM: exam not indicated  ASSESSMENT: Normal pregnancy  PLAN: New OB counseling: The patient has been given an overview regarding routine prenatal care. Recommendations regarding diet, weight gain, and exercise in pregnancy were given. Prenatal testing, optional genetic testing, carrier screening, and ultrasound use in pregnancy were reviewed.  Benefits of Breast Feeding were discussed. The patient is encouraged to consider nursing her baby post partum. Panorama testing completed today.   [name], CNM

## 2021-04-03 LAB — CBC
Hematocrit: 38.3 % (ref 34.0–46.6)
Hemoglobin: 12.8 g/dL (ref 11.1–15.9)
MCH: 29.5 pg (ref 26.6–33.0)
MCHC: 33.4 g/dL (ref 31.5–35.7)
MCV: 88 fL (ref 79–97)
Platelets: 337 10*3/uL (ref 150–450)
RBC: 4.34 x10E6/uL (ref 3.77–5.28)
RDW: 12.9 % (ref 11.7–15.4)
WBC: 10.7 10*3/uL (ref 3.4–10.8)

## 2021-04-04 LAB — CYTOLOGY - PAP: Diagnosis: NEGATIVE

## 2021-04-09 ENCOUNTER — Telehealth: Payer: Self-pay | Admitting: Certified Nurse Midwife

## 2021-04-09 NOTE — Telephone Encounter (Signed)
Called [facility] spoke with Teressa Senter and informed her that I did miss the check box. However, I was not able to fax the form back due to being home with covid.   They are unable to take a verbal. However, Aundra Millet states this will not hinder the process or the pt getting her meds. She will make a note on the pts acct. Forms were faxed to the office today in regards to pts insurance.   CB- will you pls look for these forms and confirm we do not need to do anything else for this pt. I will fax the  form upon my return. TY

## 2021-04-09 NOTE — Telephone Encounter (Signed)
Bonnie Graham) called stating that there is missing information on recent RX sent- Step 3 Question 1 needs to be filled out and form needs to be refaxed- same number [phone].

## 2021-04-09 NOTE — Telephone Encounter (Signed)
OK 

## 2021-05-06 ENCOUNTER — Encounter: Payer: Self-pay | Admitting: Certified Nurse Midwife

## 2021-05-06 ENCOUNTER — Ambulatory Visit (INDEPENDENT_AMBULATORY_CARE_PROVIDER_SITE_OTHER): Payer: Medicaid Other | Admitting: Certified Nurse Midwife

## 2021-05-06 ENCOUNTER — Other Ambulatory Visit: Payer: Self-pay

## 2021-05-06 VITALS — BP 106/73 | HR 86 | Wt 151.2 lb

## 2021-05-06 DIAGNOSIS — Z8751 Personal history of pre-term labor: Secondary | ICD-10-CM

## 2021-05-06 DIAGNOSIS — O99891 Other specified diseases and conditions complicating pregnancy: Secondary | ICD-10-CM

## 2021-05-06 DIAGNOSIS — Z3689 Encounter for other specified antenatal screening: Secondary | ICD-10-CM

## 2021-05-06 DIAGNOSIS — Z3482 Encounter for supervision of other normal pregnancy, second trimester: Secondary | ICD-10-CM | POA: Diagnosis not present

## 2021-05-06 DIAGNOSIS — M549 Dorsalgia, unspecified: Secondary | ICD-10-CM

## 2021-05-06 DIAGNOSIS — Z3A16 16 weeks gestation of pregnancy: Secondary | ICD-10-CM

## 2021-05-06 LAB — POCT URINALYSIS DIPSTICK OB
Glucose, UA: NEGATIVE
Leukocytes, UA: NEGATIVE
Spec Grav, UA: 1.025 (ref 1.010–1.025)
Urobilinogen, UA: 0.2 E.U./dL
pH, UA: 6.5 (ref 5.0–8.0)

## 2021-05-06 MED ORDER — HYDROXYPROGESTERONE CAPROATE 250 MG/ML IM OIL
250.0000 mg | TOPICAL_OIL | Freq: Once | INTRAMUSCULAR | Status: AC
  Administered 2021-05-06: 250 mg via INTRAMUSCULAR

## 2021-05-06 NOTE — Patient Instructions (Signed)
Second Trimester of Pregnancy °The second trimester of pregnancy is from week 13 through week 27. This is also called months 4 through 6 of pregnancy. This is often the time when you feel your best. °During the second trimester: °Morning sickness is less or has stopped. °You may have more energy. °You may feel hungry more often. °At this time, your unborn baby (fetus) is growing very fast. At the end of the sixth month, the unborn baby may be up to 12 inches long and weigh about 1½ pounds. You will likely start to feel the baby move between 16 and 20 weeks of pregnancy. °Body changes during your second trimester °Your body continues to go through many changes during this time. The changes vary and generally return to normal after the baby is born. °Physical changes °You will gain more weight. °You may start to get stretch marks on your hips, belly (abdomen), and breasts. °Your breasts will grow and may hurt. °Dark spots or blotches may develop on your face. °A dark line from your belly button to the pubic area (linea nigra) may appear. °You may have changes in your hair. °Health changes °You may have headaches. °You may have heartburn. °You may have trouble pooping (constipation). °You may have hemorrhoids or swollen, bulging veins (varicose veins). °Your gums may bleed. °You may pee (urinate) more often. °You may have back pain. °Follow these instructions at home: °Medicines °Take over-the-counter and prescription medicines only as told by your doctor. Some medicines are not safe during pregnancy. °Take a prenatal vitamin that contains at least 600 micrograms (mcg) of folic acid. °Eating and drinking °Eat healthy meals that include: °Fresh fruits and vegetables. °Whole grains. °Good sources of protein, such as meat, eggs, or tofu. °Low-fat dairy products. °Avoid raw meat and unpasteurized juice, milk, and cheese. °You may need to take these actions to prevent or treat trouble pooping: °Drink enough fluids to keep  your pee (urine) pale yellow. °Eat foods that are high in fiber. These include beans, whole grains, and fresh fruits and vegetables. °Limit foods that are high in fat and sugar. These include fried or sweet foods. °Activity °Exercise only as told by your doctor. Most people can do their usual exercise during pregnancy. Try to exercise for 30 minutes at least 5 days a week. °Stop exercising if you have pain or cramps in your belly or lower back. °Do not exercise if it is too hot or too humid, or if you are in a place of great height (high altitude). °Avoid heavy lifting. °If you choose to, you may have sex unless your doctor tells you not to. °Relieving pain and discomfort °Wear a good support bra if your breasts are sore. °Take warm water baths (sitz baths) to soothe pain or discomfort caused by hemorrhoids. Use hemorrhoid cream if your doctor approves. °Rest with your legs raised (elevated) if you have leg cramps or low back pain. °If you develop bulging veins in your legs: °Wear support hose as told by your doctor. °Raise your feet for 15 minutes, 3-4 times a day. °Limit salt in your food. °Safety °Wear your seat belt at all times when you are in a car. °Talk with your doctor if someone is hurting you or yelling at you a lot. °Lifestyle °Do not use hot tubs, steam rooms, or saunas. °Do not douche. Do not use tampons or scented sanitary pads. °Avoid cat litter boxes and soil used by cats. These carry germs that can harm your baby and   can cause a loss of your baby by miscarriage or stillbirth. °Do not use herbal medicines, illegal drugs, or medicines that are not approved by your doctor. Do not drink alcohol. °Do not smoke or use any products that contain nicotine or tobacco. If you need help quitting, ask your doctor. °General instructions °Keep all follow-up visits. This is important. °Ask your doctor about local prenatal classes. °Ask your doctor about the right foods to eat or for help finding a  counselor. °Where to find more information °American Pregnancy Association: americanpregnancy.org °American College of Obstetricians and Gynecologists: www.acog.org °Office on Women's Health: womenshealth.gov/pregnancy °Contact a doctor if: °You have a headache that does not go away when you take medicine. °You have changes in how you see, or you see spots in front of your eyes. °You have mild cramps, pressure, or pain in your lower belly. °You continue to feel like you may vomit (nauseous), you vomit, or you have watery poop (diarrhea). °You have bad-smelling fluid coming from your vagina. °You have pain when you pee or your pee smells bad. °You have very bad swelling of your face, hands, ankles, feet, or legs. °You have a fever. °Get help right away if: °You are leaking fluid from your vagina. °You have spotting or bleeding from your vagina. °You have very bad belly cramping or pain. °You have trouble breathing. °You have chest pain. °You faint. °You have not felt your baby move for the time period told by your doctor. °You have new or increased pain, swelling, or redness in an arm or leg. °Summary °The second trimester of pregnancy is from week 13 through week 27 (months 4 through 6). °Eat healthy meals. °Exercise as told by your doctor. Most people can do their usual exercise during pregnancy. °Do not use herbal medicines, illegal drugs, or medicines that are not approved by your doctor. Do not drink alcohol. °Call your doctor if you get sick or if you notice anything unusual about your pregnancy. °This information is not intended to replace advice given to you by your health care provider. Make sure you discuss any questions you have with your health care provider. °Document Revised: 04/04/2020 Document Reviewed: 02/09/2020 °Elsevier Patient Education © 2022 [employer] Inc. ° °

## 2021-05-06 NOTE — Progress Notes (Signed)
ROB: She is doing well today. She has no concerns. 

## 2021-05-06 NOTE — Progress Notes (Signed)
ROB-Reports back pain, requests referral to Physical Therapy. Referral to PT, see orders. Referral to MFM for ANATOMY SCAN, see orders. Anticipatory guidance regarding course of prenatal care. Makena injection given today, see chart. Reviewed red flag symptoms and when to call. RTC weekly for Makena injection. RTC x 4 weeks for ROB or sooner if needed.

## 2021-05-07 ENCOUNTER — Other Ambulatory Visit: Payer: Self-pay | Admitting: Certified Nurse Midwife

## 2021-05-07 DIAGNOSIS — Z3689 Encounter for other specified antenatal screening: Secondary | ICD-10-CM

## 2021-05-14 ENCOUNTER — Other Ambulatory Visit: Payer: Self-pay

## 2021-05-14 ENCOUNTER — Encounter: Payer: Self-pay | Admitting: Certified Nurse Midwife

## 2021-05-14 ENCOUNTER — Ambulatory Visit (INDEPENDENT_AMBULATORY_CARE_PROVIDER_SITE_OTHER): Payer: Medicaid Other | Admitting: Certified Nurse Midwife

## 2021-05-14 VITALS — BP 115/77 | HR 92 | Ht 64.0 in | Wt 154.0 lb

## 2021-05-14 DIAGNOSIS — Z3492 Encounter for supervision of normal pregnancy, unspecified, second trimester: Secondary | ICD-10-CM

## 2021-05-14 MED ORDER — HYDROXYPROGESTERONE CAPROATE 275 MG/1.1ML ~~LOC~~ SOAJ
275.0000 mg | Freq: Once | SUBCUTANEOUS | Status: AC
  Administered 2021-05-14: 275 mg via SUBCUTANEOUS

## 2021-05-14 NOTE — Progress Notes (Signed)
Pt presents for Makena injection. No questions or concerns from pt.  

## 2021-05-20 ENCOUNTER — Ambulatory Visit: Payer: Medicaid Other

## 2021-05-21 ENCOUNTER — Other Ambulatory Visit: Payer: Self-pay

## 2021-05-21 ENCOUNTER — Ambulatory Visit (INDEPENDENT_AMBULATORY_CARE_PROVIDER_SITE_OTHER): Payer: Medicaid Other | Admitting: Certified Nurse Midwife

## 2021-05-21 VITALS — BP 113/74 | HR 75 | Ht 64.0 in | Wt 155.3 lb

## 2021-05-21 DIAGNOSIS — Z3492 Encounter for supervision of normal pregnancy, unspecified, second trimester: Secondary | ICD-10-CM

## 2021-05-21 MED ORDER — HYDROXYPROGESTERONE CAPROATE 275 MG/1.1ML ~~LOC~~ SOAJ
275.0000 mg | Freq: Once | SUBCUTANEOUS | Status: AC
  Administered 2021-05-21: 275 mg via SUBCUTANEOUS

## 2021-05-21 NOTE — Progress Notes (Signed)
Pt presents for Makena injection. No questions or concerns from pt.

## 2021-05-22 ENCOUNTER — Ambulatory Visit: Payer: Medicaid Other | Attending: Certified Nurse Midwife | Admitting: Physical Therapy

## 2021-05-22 ENCOUNTER — Encounter: Payer: Self-pay | Admitting: Physical Therapy

## 2021-05-22 DIAGNOSIS — M545 Low back pain, unspecified: Secondary | ICD-10-CM | POA: Diagnosis present

## 2021-05-22 DIAGNOSIS — M6281 Muscle weakness (generalized): Secondary | ICD-10-CM | POA: Diagnosis present

## 2021-05-22 DIAGNOSIS — R293 Abnormal posture: Secondary | ICD-10-CM | POA: Insufficient documentation

## 2021-05-22 NOTE — Therapy (Signed)
[facility] [address] [place], [place], [address] Phone: [phone]   Fax:  [phone]  Physical Therapy Evaluation  Patient Details  Name: Bonnie Graham MRN: [id number] Date of Birth: 11-30-1995 Referring Provider (PT): [name]   Encounter Date: 05/22/2021   PT End of Session - 05/22/21 1625     Visit Number 1    Number of Visits 12    Date for PT Re-Evaluation 08/14/21    PT Start Time 1630    PT Stop Time 1710    PT Time Calculation (min) 40 min    Activity Tolerance Patient tolerated treatment well    Behavior During Therapy [facility] for tasks assessed/performed             Past Medical History:  Diagnosis Date   Heavy periods    Painful menstrual periods     Past Surgical History:  Procedure Laterality Date   NO PAST SURGERIES      There were no vitals filed for this visit.        [facility] PT Assessment - 05/22/21 0001       Assessment   Medical Diagnosis LBP during pregnancy    Referring Provider (PT) [name]    Hand Dominance Right    Next MD Visit 06/03/2021    Prior Therapy Yes, during last pregnancy      Balance Screen   Has the patient fallen in the past 6 months No             PELVIC HEALTH PHYSICAL THERAPY EVALUATION  SCREENING Red Flags: None Have you had any night sweats? Unexplained weight loss? Saddle anesthesia? Unexplained changes in bowel or bladder habits?  Precautions: [redacted] weeks pregnant  SUBJECTIVE  Chief Complaint: Patient notes that she feels a lot of pressure in the lower abdominal area, urethral region, and vagina. Patient feels it come on more if she lifts something > 20 lbs. Patient has low back pain which she notes switches sides; the pain is through the superior glutes to the low back.  Patient notes that if she is sitting on a hard surface, her back feels better. She notes that if she is sitting in a soft surface it takes awhile to straighten  and she feels increased tension and tightness in her back and bottom.   Pertinent History:  Falls Negative.  Scoliosis Negative. Pulmonary disease/dysfunction Negative. Surgical history: Negative.    Obstetrical History: G3P2 Deliveries: SVD Tearing/Episiotomy: grade 1 G1  Gynecological History: Hysterectomy: No  Endometriosis: Negative Pelvic Organ Prolapse: Positive for pulling in the lower abdominal area Last Menstrual Period: 01/2021 Heaviness/pressure: Yes    Urinary History: Incontinence: Positive. Triggers: sneezing (15-25%) Amount: Min. Fluid Intake: 2-3 16 oz H20, 1 cup coffee caffeinated, occasional juices/sodas Nocturia: 3x/night Frequency of urination: every 2-3 hours Pain with urination: Negative Difficulty initiating urination: Negative Intermittent stream: Negative Frequent UTI: Negative.   Gastrointestinal History: [place] Stool Chart: Type 4 Frequency of BMs: 1-3x/day Pain with defecation: Negative Straining with defecation: Negative Hemorrhoids: Negative ; internal/external; active/latent  Sexual activity/pain: Pain with intercourse: Positive for position dependent.   Initial penetration: No  Deep thrusting: Yes   Location of pain: LBP Current pain:  0/10  Max pain:  10/10 (transfers, vacuuming, cleaning) Least pain:  0/10 (sitting on a hard surface) Pain quality: pain quality: dull and tension, tightness Radiating pain: No   Location of pain: pelvic Current pain:  0/10  Max pain:  7/10 (  lifting, increased walking) Least pain:  0/10 Pain quality: pain quality: pressure Radiating pain: Yes L ischial tuberosity/Alcock's canal  Current activities:  Caregiving  Patient Goals:  "To fix the pressure" "to resolve my back"   OBJECTIVE  Mental Status Patient is oriented to person, place and time.  Recent memory is intact.  Remote memory is intact.  Attention span and concentration are intact.  Expressive speech is intact.  Patient's  fund of knowledge is within normal limits for educational level.  POSTURE/OBSERVATIONS:  Lumbar lordosis: mildly increased Increased anterior pelvic tilt in sitting and standing postures.  GAIT: Grossly WFL.  RANGE OF MOTION: deferred 2/2 to time constraints   LEFT RIGHT  Lumbar forward flexion (65):      Lumbar extension (30):     Lumbar lateral flexion (25):     Thoracic and Lumbar rotation (30 degrees):       Hip Flexion (0-125):      Hip IR (0-45):     Hip ER (0-45):     Hip Abduction (0-40):     Hip extension (0-15):       STRENGTH: MMT deferred 2/2 to time constraints  RLE LLE  Hip Flexion    Hip Extension    Hip Abduction     Hip Adduction     Hip ER     Hip IR     Knee Extension    Knee Flexion    Dorsiflexion     Plantarflexion (seated)     ABDOMINAL: deferred 2/2 to time constraints Palpation: Diastasis: Scar mobility: Rib flare:  SPECIAL TESTS: deferred 2/2 to time constraints   PHYSICAL PERFORMANCE MEASURES: STS: Slow rise with gradual spinal extension   EXTERNAL PELVIC EXAM: deferred 2/2 to time constraints Palpation: Breath coordination: Voluntary Contraction: present/absent Relaxation: full/delayed/non-relaxing Perineal movement with sustained IAP increase ("bear down"): descent/no change/elevation/excessive descent Perineal movement with rapid IAP increase ("cough"): elevation/no change/descent  INTERNAL VAGINAL EXAM: not indicated Introitus Appears:  Skin integrity:  Scar mobility: Strength (PERF):  Symmetry: Palpation: Prolapse:   INTERNAL RECTAL EXAM: not indicated Strength (PERF): Symmetry: Palpation: Prolapse:   OUTCOME MEASURES: FOTO (PFDI Pain 33)   ASSESSMENT Patient is a 25 year old presenting to clinic with chief complaints of SIJ pain and pelvic pressure. Today's evaluation is suggestive of deficits in posture, postural strength/control, pain, PFM strength, and IAP management as evidenced by increased  pain/pressure with lifting and prolonged standing activity, increased anterior pelvic tilt in sitting and standing, 10/10 SIJ pain and 7/10 pelvic pressure. Patient's responses on FOTO PFDI Pain outcome measures (33) indicate significant functional limitations/disability/distress. Patient's progress may be limited due to the progressive nature of pregnancy; however, patient's motivation and past success with physical therapy intervention are advantageous. Patient was able to achieve basic understanding of PFM anatomy as well as pelvic organ support considerations during today's evaluation and responded positively to educational interventions. Patient will benefit from continued skilled therapeutic intervention to address deficits in posture, postural strength/control, pain, PFM strength, and IAP management in order to increase function and improve overall QOL.  EDUCATION Patient educated on prognosis, POC, and provided with HEP including: pelvic organ support garments. Patient articulated understanding and returned demonstration. Patient will benefit from further education in order to maximize compliance and understanding for long-term therapeutic gains.  TREATMENT Neuromuscular Re-education: Patient educated on primary functions of the pelvic floor including: posture/balance, sexual pleasure, storage and elimination of waste from the body, abdominal cavity closure, and breath coordination.  Objective measurements completed on examination: See above findings.        PT Long Term Goals - 05/22/21 1736       PT LONG TERM GOAL #1   Title Patient will demonstrate independence with HEP in order to maximize therapeutic gains and improve carryover from physical therapy sessions to ADLs in the home and community.    Baseline IE: not initiated    Time 12    Period Weeks    Status New    Target Date 08/14/21      PT LONG TERM GOAL #2   Title Patient will demonstrate improved function as  evidenced by a score of <15 on FOTO measure for full participation in activities at home and in the community.    Baseline IE: 33    Time 12    Period Weeks    Status New    Target Date 08/14/21      PT LONG TERM GOAL #3   Title Patient will decrease worst pain as reported on NPRS by at least 2 points to demonstrate clinically significant reduction in pain in order to restore/improve function and overall QOL.    Baseline IE: back 10/10, pelvic 7/10    Time 12    Period Weeks    Status New    Target Date 08/14/21      PT LONG TERM GOAL #4   Title Patient will demonstrate circumferential and sequential contraction of >4/5 MMT, > 6 sec hold x10 and 5 consecutive quick flicks with </= 10 min rest between testing bouts, and relaxation of the PFM coordinated with breath for improved management of intra-abdominal pressure and normal bowel and bladder function without the presence of pain nor incontinence in order to improve participation at home and in the community.    Baseline IE: not demonstrated    Time 12    Period Weeks    Status New    Target Date 08/14/21                    Plan - 05/22/21 1626     Clinical Impression Statement Patient is a 25 year old presenting to clinic with chief complaints of SIJ pain and pelvic pressure. Today's evaluation is suggestive of deficits in posture, postural strength/control, pain, PFM strength, and IAP management as evidenced by increased pain/pressure with lifting and prolonged standing activity, increased anterior pelvic tilt in sitting and standing, 10/10 SIJ pain and 7/10 pelvic pressure. Patient's responses on FOTO PFDI Pain outcome measures (33) indicate significant functional limitations/disability/distress. Patient's progress may be limited due to the progressive nature of pregnancy; however, patient's motivation and past success with physical therapy intervention are advantageous. Patient was able to achieve basic understanding of PFM  anatomy as well as pelvic organ support considerations during today's evaluation and responded positively to educational interventions. Patient will benefit from continued skilled therapeutic intervention to address deficits in posture, postural strength/control, pain, PFM strength, and IAP management in order to increase function and improve overall QOL.    Personal Factors and Comorbidities Age;Education;Sex;Behavior Pattern;Fitness;Time since onset of injury/illness/exacerbation;Past/Current Experience    Examination-Activity Limitations Carry;Caring for Others;Bend;Locomotion Level;Stand;Lift;Bed Mobility;Squat;Stairs;Transfers;Sit;Sleep    Examination-Participation Restrictions Laundry;Cleaning;Meal Prep;Interpersonal Relationship;Driving    Stability/Clinical Decision Making Evolving/Moderate complexity    Clinical Decision Making Moderate    Rehab Potential Fair    PT Frequency 1x / week    PT Duration 12 weeks    PT Treatment/Interventions ADLs/Self Care Home Management;Aquatic Therapy;Cryotherapy;Moist Heat;Stair training;Gait  training;Therapeutic exercise;Balance training;Patient/family education;Therapeutic activities;Neuromuscular re-education;Manual techniques;Dry needling;Spinal Manipulations;Joint Manipulations;Taping    PT Next Visit Plan IAP basics, postural re-education    PT Home Exercise Plan none provided    Consulted and Agree with Plan of Care Patient             Patient will benefit from skilled therapeutic intervention in order to improve the following deficits and impairments:  Abnormal gait, Decreased balance, Difficulty walking, Increased muscle spasms, Improper body mechanics, Decreased range of motion, Decreased activity tolerance, Postural dysfunction, Pain, Impaired flexibility, Hypermobility, Decreased strength  Visit Diagnosis: Abnormal posture  Low back pain, unspecified back pain laterality, unspecified chronicity, unspecified whether sciatica  present  Muscle weakness (generalized)     Problem List Patient Active Problem List   Diagnosis Date Noted   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   History of preterm delivery 06/17/2018   History of heavy periods 01/02/2017   Dysmenorrhea 01/02/2017   Family history of endometriosis in first degree relative 01/02/2017   Family history of first-degree relative with cardiomyopathy 04/16/2016   Sheria Lang PT, DPT [phone]  05/22/2021, 5:38 PM  [facility] [address] [place], [place], [address] Phone: [phone]   Fax:  [phone]  Name: Bonnie Graham MRN: [id number] Date of Birth: Aug 18, 1996

## 2021-05-27 ENCOUNTER — Other Ambulatory Visit: Payer: Self-pay

## 2021-05-27 ENCOUNTER — Ambulatory Visit (INDEPENDENT_AMBULATORY_CARE_PROVIDER_SITE_OTHER): Payer: Medicaid Other | Admitting: Certified Nurse Midwife

## 2021-05-27 DIAGNOSIS — Z3482 Encounter for supervision of other normal pregnancy, second trimester: Secondary | ICD-10-CM

## 2021-05-27 DIAGNOSIS — Z8751 Personal history of pre-term labor: Secondary | ICD-10-CM | POA: Diagnosis not present

## 2021-05-27 MED ORDER — HYDROXYPROGESTERONE CAPROATE 250 MG/ML IM OIL
250.0000 mg | TOPICAL_OIL | Freq: Once | INTRAMUSCULAR | Status: AC
  Administered 2021-05-27: 250 mg via INTRAMUSCULAR

## 2021-05-27 NOTE — Progress Notes (Signed)
Patient is here today for her fourth Makena injection. She tolerated injection well. No side effects. She wants to know if she needs to have more injections ordered.

## 2021-05-29 ENCOUNTER — Encounter: Payer: Self-pay | Admitting: Physical Therapy

## 2021-05-29 ENCOUNTER — Ambulatory Visit: Payer: Medicaid Other | Admitting: Physical Therapy

## 2021-05-29 ENCOUNTER — Other Ambulatory Visit: Payer: Self-pay

## 2021-05-29 DIAGNOSIS — M6281 Muscle weakness (generalized): Secondary | ICD-10-CM

## 2021-05-29 DIAGNOSIS — R293 Abnormal posture: Secondary | ICD-10-CM | POA: Diagnosis not present

## 2021-05-29 DIAGNOSIS — M545 Low back pain, unspecified: Secondary | ICD-10-CM

## 2021-05-29 NOTE — Therapy (Signed)
[facility] [address] [place], [place], [address] Phone: [phone]   Fax:  [phone]  Physical Therapy Treatment  Patient Details  Name: Bonnie Graham MRN: [id number] Date of Birth: 07/29/1996 Referring Provider (PT): [name]   Encounter Date: 05/29/2021   PT End of Session - 05/29/21 1641     Visit Number 2    Number of Visits 12    Date for PT Re-Evaluation 08/14/21    PT Start Time 1630    PT Stop Time 1710    PT Time Calculation (min) 40 min    Activity Tolerance Patient tolerated treatment well    Behavior During Therapy [facility] for tasks assessed/performed             Past Medical History:  Diagnosis Date   Heavy periods    Painful menstrual periods     Past Surgical History:  Procedure Laterality Date   NO PAST SURGERIES      There were no vitals filed for this visit.   Subjective Assessment - 05/29/21 1640     Subjective Patient reports that she continues to feel pressure and pulling sensation at the pelvis; however notes back pain is well-controlled. Patient did note that she has increased pressure/pulling at the end of the day.    Currently in Pain? Yes    Pain Score 3     Pain Location Pelvis    Pain Descriptors / Indicators Pressure            TREATMENT  Pre-treatment assessment: ABDOMINAL:  Palpation: increase in pressure at umbilicus with palpation over suprapubic area Diastasis: 4 finger width at the umbilicus Scar mobility: n/a Rib flare: none noted  EXTERNAL PELVIC EXAM: Patient educated on the purpose of the pelvic exam and articulated understanding; patient consented to the exam verbally. Breath coordination: present Cued Lengthen: perineal descent Cued Contraction: 1/5 initial trial, 2/5 with more cueing; complete relaxation after contraction  Neuromuscular Re-education: Supine hooklying diaphragmatic breathing with VCs and TCs for downregulation of the nervous  system and improved management of IAP Supine hooklying, TrA activation with exhalation. VCs and TCs to decrease compensatory patterns and minimize aggravation of the lumbar paraspinals. Counter supported TrA activation with exhalation. VCs and TCs to decrease compensatory patterns and minimize aggravation of the lumbar paraspinals.   Patient educated throughout session on appropriate technique and form using multi-modal cueing, HEP, and activity modification. Patient articulated understanding and returned demonstration.  Patient Response to interventions: Comfortable to return in 1 week for deep core progression  ASSESSMENT Patient presents to clinic with excellent motivation to participate in therapy. Patient demonstrates deficits in posture, postural strength/control, pain, PFM strength, and IAP management. Patient able to achieve coordinated TrA activation in counter supported posture during today's session and responded positively to active interventions. Patient will benefit from continued skilled therapeutic intervention to address remaining deficits in posture, postural strength/control, pain, PFM strength, and IAP management in order to increase function and improve overall QOL.     PT Long Term Goals - 05/22/21 1736       PT LONG TERM GOAL #1   Title Patient will demonstrate independence with HEP in order to maximize therapeutic gains and improve carryover from physical therapy sessions to ADLs in the home and community.    Baseline IE: not initiated    Time 12    Period Weeks    Status New    Target Date 08/14/21  PT LONG TERM GOAL #2   Title Patient will demonstrate improved function as evidenced by a score of <15 on FOTO measure for full participation in activities at home and in the community.    Baseline IE: 33    Time 12    Period Weeks    Status New    Target Date 08/14/21      PT LONG TERM GOAL #3   Title Patient will decrease worst pain as reported on NPRS by  at least 2 points to demonstrate clinically significant reduction in pain in order to restore/improve function and overall QOL.    Baseline IE: back 10/10, pelvic 7/10    Time 12    Period Weeks    Status New    Target Date 08/14/21      PT LONG TERM GOAL #4   Title Patient will demonstrate circumferential and sequential contraction of >4/5 MMT, > 6 sec hold x10 and 5 consecutive quick flicks with </= 10 min rest between testing bouts, and relaxation of the PFM coordinated with breath for improved management of intra-abdominal pressure and normal bowel and bladder function without the presence of pain nor incontinence in order to improve participation at home and in the community.    Baseline IE: not demonstrated    Time 12    Period Weeks    Status New    Target Date 08/14/21                   Plan - 05/29/21 1642     Clinical Impression Statement Patient presents to clinic with excellent motivation to participate in therapy. Patient demonstrates deficits in posture, postural strength/control, pain, PFM strength, and IAP management. Patient able to achieve coordinated TrA activation in counter supported posture during today's session and responded positively to active interventions. Patient will benefit from continued skilled therapeutic intervention to address remaining deficits in posture, postural strength/control, pain, PFM strength, and IAP management in order to increase function and improve overall QOL.    Personal Factors and Comorbidities Age;Education;Sex;Behavior Pattern;Fitness;Time since onset of injury/illness/exacerbation;Past/Current Experience    Examination-Activity Limitations Carry;Caring for Others;Bend;Locomotion Level;Stand;Lift;Bed Mobility;Squat;Stairs;Transfers;Sit;Sleep    Examination-Participation Restrictions Laundry;Cleaning;Meal Prep;Interpersonal Relationship;Driving    Stability/Clinical Decision Making Evolving/Moderate complexity    Rehab Potential  Fair    PT Frequency 1x / week    PT Duration 12 weeks    PT Treatment/Interventions ADLs/Self Care Home Management;Aquatic Therapy;Cryotherapy;Moist Heat;Stair training;Gait training;Therapeutic exercise;Balance training;Patient/family education;Therapeutic activities;Neuromuscular re-education;Manual techniques;Dry needling;Spinal Manipulations;Joint Manipulations;Taping    PT Next Visit Plan IAP basics, postural re-education    PT Home Exercise Plan none provided    Consulted and Agree with Plan of Care Patient             Patient will benefit from skilled therapeutic intervention in order to improve the following deficits and impairments:  Abnormal gait, Decreased balance, Difficulty walking, Increased muscle spasms, Improper body mechanics, Decreased range of motion, Decreased activity tolerance, Postural dysfunction, Pain, Impaired flexibility, Hypermobility, Decreased strength  Visit Diagnosis: Abnormal posture  Low back pain, unspecified back pain laterality, unspecified chronicity, unspecified whether sciatica present  Muscle weakness (generalized)     Problem List Patient Active Problem List   Diagnosis Date Noted   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   History of preterm delivery 06/17/2018   History of heavy periods 01/02/2017   Dysmenorrhea 01/02/2017   Family history of endometriosis in first degree relative 01/02/2017   Family history of first-degree relative with cardiomyopathy 04/16/2016  Sheria Lang PT, DPT [phone]  05/29/2021, 6:11 PM  [facility] [address] [place], [place], [address] Phone: [phone]   Fax:  [phone]  Name: Bonnie Graham MRN: [id number] Date of Birth: 02/23/1996

## 2021-05-31 NOTE — Telephone Encounter (Signed)
[employer] customer service line and was transferred to patients compounding pharmacy [facility] [phone]). Pharmacy to expedite refill and stated it should be delivered by Monday afternoon 12:00 06/03/2021. Pharmacy informed us that whichever nurse administers the 3rd injection of Eilene Ghazi should call the pharmacy and request refill to be sent so that the next 4 injections order will be on time for patient. Noted with patient's nurse.   Spoke with patient and let her know her refill should be here by Monday afternoon of her appt (per pharmacy). No further questions voiced from patient.

## 2021-06-03 ENCOUNTER — Ambulatory Visit (INDEPENDENT_AMBULATORY_CARE_PROVIDER_SITE_OTHER): Payer: Medicaid Other | Admitting: Certified Nurse Midwife

## 2021-06-03 ENCOUNTER — Other Ambulatory Visit: Payer: Self-pay

## 2021-06-03 ENCOUNTER — Encounter: Payer: Self-pay | Admitting: Certified Nurse Midwife

## 2021-06-03 VITALS — BP 115/71 | HR 89 | Wt 157.1 lb

## 2021-06-03 DIAGNOSIS — O09899 Supervision of other high risk pregnancies, unspecified trimester: Secondary | ICD-10-CM

## 2021-06-03 DIAGNOSIS — Z3482 Encounter for supervision of other normal pregnancy, second trimester: Secondary | ICD-10-CM

## 2021-06-03 DIAGNOSIS — Z3A20 20 weeks gestation of pregnancy: Secondary | ICD-10-CM

## 2021-06-03 LAB — POCT URINALYSIS DIPSTICK OB
Bilirubin, UA: NEGATIVE
Blood, UA: NEGATIVE
Glucose, UA: NEGATIVE
Ketones, UA: NEGATIVE
Leukocytes, UA: NEGATIVE
Nitrite, UA: NEGATIVE
POC,PROTEIN,UA: NEGATIVE
Spec Grav, UA: 1.015 (ref 1.010–1.025)
Urobilinogen, UA: 0.2 E.U./dL
pH, UA: 7.5 (ref 5.0–8.0)

## 2021-06-03 NOTE — Patient Instructions (Signed)
Round Ligament Pain  The round ligament is a cord of muscle and tissue that helps support the uterus. It can become a source of pain during pregnancy if it becomes stretched or twisted as the baby grows. The pain usually begins in the second trimester (13-28 weeks) of pregnancy, and it can come and go until the baby is delivered.It is not a serious problem, and it does not cause harm to the baby. Round ligament pain is usually a short, sharp, and pinching pain, but it can also be a dull, lingering, and aching pain. The pain is felt in the lower side of the abdomen or in the groin. It usually starts deep in the groin and moves up to the outside of the hip area. The pain may occur when you: Suddenly change position, such as quickly going from a sitting to standing position. Roll over in bed. Cough or sneeze. Do physical activity. Follow these instructions at home:  Watch your condition for any changes. When the pain starts, relax. Then try any of these methods to help with the pain: Sitting down. Flexing your knees up to your abdomen. Lying on your side with one pillow under your abdomen and another pillow between your legs. Sitting in a warm bath for 15-20 minutes or until the pain goes away. Take over-the-counter and prescription medicines only as told by your health care provider. Move slowly when you sit down or stand up. Avoid long walks if they cause pain. Stop or reduce your physical activities if they cause pain. Keep all follow-up visits as told by your health care provider. This is important. Contact a health care provider if: Your pain does not go away with treatment. You feel pain in your back that you did not have before. Your medicine is not helping. Get help right away if: You have a fever or chills. You develop uterine contractions. You have vaginal bleeding. You have nausea or vomiting. You have diarrhea. You have pain when you urinate. Summary Round ligament pain is  felt in the lower abdomen or groin. It is usually a short, sharp, and pinching pain. It can also be a dull, lingering, and aching pain. This pain usually begins in the second trimester (13-28 weeks). It occurs because the uterus is stretching with the growing baby, and it is not harmful to the baby. You may notice the pain when you suddenly change position, when you cough or sneeze, or during physical activity. Relaxing, flexing your knees to your abdomen, lying on one side, or taking a warm bath may help to get rid of the pain. Get help from your health care provider if the pain does not go away or if you have vaginal bleeding, nausea, vomiting, diarrhea, or painful urination. This information is not intended to replace advice given to you by your health care provider. Make sure you discuss any questions you have with your healthcare provider. Document Revised: 10/12/2020 Document Reviewed: 04/14/2018 Elsevier Patient Education  2022 [employer]  

## 2021-06-03 NOTE — Progress Notes (Signed)
ROB , doing well. Getting makena injections. Makena injection not available today, supposed to come in this afternoon. Will call pt when it is available. Feels fetal movement. Reviewed round ligament pain and self help measures. U/s for anatomy scheduled 8/9. ROB 4wk , nurse visit next week for Makena injections.   [name], CNM

## 2021-06-04 ENCOUNTER — Ambulatory Visit (INDEPENDENT_AMBULATORY_CARE_PROVIDER_SITE_OTHER): Payer: Medicaid Other | Admitting: Certified Nurse Midwife

## 2021-06-04 DIAGNOSIS — O09899 Supervision of other high risk pregnancies, unspecified trimester: Secondary | ICD-10-CM

## 2021-06-04 MED ORDER — HYDROXYPROGESTERONE CAPROATE 250 MG/ML IM OIL
250.0000 mg | TOPICAL_OIL | Freq: Once | INTRAMUSCULAR | Status: AC
  Administered 2021-06-04: 250 mg via INTRAMUSCULAR

## 2021-06-04 NOTE — Progress Notes (Signed)
Patient came in today for her 5th [facility] injection. She tolerated injection well. She was suppose to get injection yesterday, but they did not arrive until today. We have reminder ourselves to order her refills when she has her next to the last injection.

## 2021-06-05 ENCOUNTER — Encounter: Payer: Self-pay | Admitting: Physical Therapy

## 2021-06-05 ENCOUNTER — Other Ambulatory Visit: Payer: Self-pay

## 2021-06-05 ENCOUNTER — Ambulatory Visit: Payer: Medicaid Other | Admitting: Physical Therapy

## 2021-06-05 DIAGNOSIS — M545 Low back pain, unspecified: Secondary | ICD-10-CM

## 2021-06-05 DIAGNOSIS — R293 Abnormal posture: Secondary | ICD-10-CM

## 2021-06-05 DIAGNOSIS — M6281 Muscle weakness (generalized): Secondary | ICD-10-CM

## 2021-06-05 NOTE — Therapy (Signed)
[place] [address] [place], [place], [address] Phone: [phone]   Fax:  [phone]  Physical Therapy Treatment  Patient Details  Name: Bonnie Graham MRN: [id number] Date of Birth: [birth year] Referring Provider (PT): [name]   Encounter Date: 06/05/2021   PT End of Session - 06/05/21 1634     Visit Number 3    Number of Visits 12    Date for PT Re-Evaluation 08/14/21    PT Start Time 1630    PT Stop Time 1710    PT Time Calculation (min) 40 min    Activity Tolerance Patient tolerated treatment well    Behavior During Therapy [facility] for tasks assessed/performed             Past Medical History:  Diagnosis Date   Heavy periods    Painful menstrual periods     Past Surgical History:  Procedure Laterality Date   NO PAST SURGERIES      There were no vitals filed for this visit.   Subjective Assessment - 06/05/21 1632     Subjective Patient denies any chnges since last visit. Continues to feel pressure and pulling with activity. Patient notes back pain isn't bad, but she may have slept wrong last night and has been catching a bit today.    Currently in Pain? Yes    Pain Score 3     Pain Location Pelvis    Pain Descriptors / Indicators Pressure              TREATMENT  Neuromuscular Re-education: Application of kinesiotape to abdomen for improved postural support and decreased pain. Patient educated on purpose and s/s of skin irritation/allergy. Patient instructed on removal strategies and confirmed understanding. Standing Pilates Postural Control Hug a Tree, GTB   Serve a Tray, GTB   Patient educated throughout session on appropriate technique and form using multi-modal cueing, HEP, and activity modification. Patient articulated understanding and returned demonstration.  Patient Response to interventions: Notes improved support with ktape and supportive undergarments.  ASSESSMENT Patient  presents to clinic with excellent motivation to participate in therapy. Patient demonstrates deficits in posture, postural strength/control, pain, PFM strength, and IAP management. Patient able to achieve coordinated TrA activation with standing Pilates postural interventions during today's session and responded positively to application of k-tape. Patient will benefit from continued skilled therapeutic intervention to address remaining deficits in posture, postural strength/control, pain, PFM strength, and IAP management in order to increase function and improve overall QOL.     PT Long Term Goals - 05/22/21 1736       PT LONG TERM GOAL #1   Title Patient will demonstrate independence with HEP in order to maximize therapeutic gains and improve carryover from physical therapy sessions to ADLs in the home and community.    Baseline IE: not initiated    Time 12    Period Weeks    Status New    Target Date 08/14/21      PT LONG TERM GOAL #2   Title Patient will demonstrate improved function as evidenced by a score of <15 on FOTO measure for full participation in activities at home and in the community.    Baseline IE: 33    Time 12    Period Weeks    Status New    Target Date 08/14/21      PT LONG TERM GOAL #3   Title Patient will decrease worst pain as reported on NPRS  by at least 2 points to demonstrate clinically significant reduction in pain in order to restore/improve function and overall QOL.    Baseline IE: back 10/10, pelvic 7/10    Time 12    Period Weeks    Status New    Target Date 08/14/21      PT LONG TERM GOAL #4   Title Patient will demonstrate circumferential and sequential contraction of >4/5 MMT, > 6 sec hold x10 and 5 consecutive quick flicks with </= 10 min rest between testing bouts, and relaxation of the PFM coordinated with breath for improved management of intra-abdominal pressure and normal bowel and bladder function without the presence of pain nor incontinence  in order to improve participation at home and in the community.    Baseline IE: not demonstrated    Time 12    Period Weeks    Status New    Target Date 08/14/21                   Plan - 06/05/21 1635     Clinical Impression Statement Patient presents to clinic with excellent motivation to participate in therapy. Patient demonstrates deficits in posture, postural strength/control, pain, PFM strength, and IAP management. Patient able to achieve coordinated TrA activation with standing Pilates postural interventions during today's session and responded positively to application of k-tape. Patient will benefit from continued skilled therapeutic intervention to address remaining deficits in posture, postural strength/control, pain, PFM strength, and IAP management in order to increase function and improve overall QOL.    Personal Factors and Comorbidities Age;Education;Sex;Behavior Pattern;Fitness;Time since onset of injury/illness/exacerbation;Past/Current Experience    Examination-Activity Limitations Carry;Caring for Others;Bend;Locomotion Level;Stand;Lift;Bed Mobility;Squat;Stairs;Transfers;Sit;Sleep    Examination-Participation Restrictions Laundry;Cleaning;Meal Prep;Interpersonal Relationship;Driving    Stability/Clinical Decision Making Evolving/Moderate complexity    Rehab Potential Fair    PT Frequency 1x / week    PT Duration 12 weeks    PT Treatment/Interventions ADLs/Self Care Home Management;Aquatic Therapy;Cryotherapy;Moist Heat;Stair training;Gait training;Therapeutic exercise;Balance training;Patient/family education;Therapeutic activities;Neuromuscular re-education;Manual techniques;Dry needling;Spinal Manipulations;Joint Manipulations;Taping    PT Next Visit Plan IAP basics, postural re-education    PT Home Exercise Plan see handouts    Consulted and Agree with Plan of Care Patient             Patient will benefit from skilled therapeutic intervention in order to  improve the following deficits and impairments:  Abnormal gait, Decreased balance, Difficulty walking, Increased muscle spasms, Improper body mechanics, Decreased range of motion, Decreased activity tolerance, Postural dysfunction, Pain, Impaired flexibility, Hypermobility, Decreased strength  Visit Diagnosis: Abnormal posture  Low back pain, unspecified back pain laterality, unspecified chronicity, unspecified whether sciatica present  Muscle weakness (generalized)     Problem List Patient Active Problem List   Diagnosis Date Noted   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   History of preterm delivery 06/17/2018   History of heavy periods 01/02/2017   Dysmenorrhea 01/02/2017   Family history of endometriosis in first degree relative 01/02/2017   Family history of first-degree relative with cardiomyopathy 04/16/2016    Sheria Lang PT, DPT [phone]  06/06/2021, 10:45 AM  [place] [facility] [address] [place], [place], [address] Phone: [phone]   Fax:  [phone]  Name: Bonnie Graham MRN: [id number] Date of Birth: [birth year]

## 2021-06-07 NOTE — Progress Notes (Signed)
I have reviewed the record and concur with patient management and plan of care.    Serafina Royals, CNM [facility], [facility] 06/07/21 9:36 AM

## 2021-06-10 ENCOUNTER — Other Ambulatory Visit: Payer: Self-pay

## 2021-06-10 ENCOUNTER — Ambulatory Visit (INDEPENDENT_AMBULATORY_CARE_PROVIDER_SITE_OTHER): Payer: Medicaid Other | Admitting: Certified Nurse Midwife

## 2021-06-10 DIAGNOSIS — O09899 Supervision of other high risk pregnancies, unspecified trimester: Secondary | ICD-10-CM | POA: Diagnosis not present

## 2021-06-10 DIAGNOSIS — O09292 Supervision of pregnancy with other poor reproductive or obstetric history, second trimester: Secondary | ICD-10-CM | POA: Diagnosis not present

## 2021-06-10 DIAGNOSIS — Z6822 Body mass index (BMI) 22.0-22.9, adult: Secondary | ICD-10-CM | POA: Diagnosis not present

## 2021-06-10 MED ORDER — HYDROXYPROGESTERONE CAPROATE 275 MG/1.1ML ~~LOC~~ SOAJ
275.0000 mg | Freq: Once | SUBCUTANEOUS | Status: AC
  Administered 2021-06-10: 275 mg via SUBCUTANEOUS

## 2021-06-10 NOTE — Progress Notes (Signed)
Patient came in for her [facility] injection. She tolerated injection well. No side effects.

## 2021-06-12 ENCOUNTER — Encounter: Payer: Self-pay | Admitting: Physical Therapy

## 2021-06-12 ENCOUNTER — Ambulatory Visit: Payer: Medicaid Other | Attending: Certified Nurse Midwife | Admitting: Physical Therapy

## 2021-06-12 ENCOUNTER — Other Ambulatory Visit: Payer: Self-pay

## 2021-06-12 DIAGNOSIS — R293 Abnormal posture: Secondary | ICD-10-CM | POA: Diagnosis present

## 2021-06-12 DIAGNOSIS — M545 Low back pain, unspecified: Secondary | ICD-10-CM | POA: Diagnosis present

## 2021-06-12 DIAGNOSIS — M6281 Muscle weakness (generalized): Secondary | ICD-10-CM | POA: Insufficient documentation

## 2021-06-12 NOTE — Therapy (Signed)
[place] [facility] [address] [place], [place], [address] Phone: [phone]   Fax:  [phone]  Physical Therapy Treatment  Patient Details  Name: Bonnie Graham MRN: [id number] Date of Birth: 17-Oct-1996 Referring Provider (PT): [name]   Encounter Date: 06/12/2021   PT End of Session - 06/12/21 1630     Visit Number 4    Number of Visits 12    Date for PT Re-Evaluation 08/14/21    PT Start Time 1630    PT Stop Time 1710    PT Time Calculation (min) 40 min    Activity Tolerance Patient tolerated treatment well    Behavior During Therapy [facility] for tasks assessed/performed             Past Medical History:  Diagnosis Date   Heavy periods    Painful menstrual periods     Past Surgical History:  Procedure Laterality Date   NO PAST SURGERIES      There were no vitals filed for this visit.   Subjective Assessment - 06/12/21 1633     Subjective Patient notes that application of Ktape was useful. Patient denies as much suprpubic pressure, but continues to have vaginal pressure.    Currently in Pain? Yes    Pain Score 1     Pain Location Vagina    Pain Descriptors / Indicators Pressure             TREATMENT  Neuromuscular Re-education: Application of kinesiotape to abdomen and lumbar mm for improved postural support and decreased pain. Patient educated on purpose and s/s of skin irritation/allergy. Patient instructed on removal strategies and confirmed understanding. Standing TrA with posterior pelvic tilt for improved postural awareness Supine hooklying diaphragmatic breathing with VCs and TCs for downregulation of the nervous system and improved management of IAP Supine hooklying, PFM contractions ( x5) with exhalation. VCs and TCs to decrease compensatory patterns and encourage activation of the PFM.    Patient educated throughout session on appropriate technique and form using multi-modal cueing, HEP,  and activity modification. Patient articulated understanding and returned demonstration.  Patient Response to interventions: Comfortable to focus on PFM strengthening 3x/day this week.  ASSESSMENT Patient presents to clinic with excellent motivation to participate in therapy. Patient demonstrates deficits in posture, postural strength/control, pain, PFM strength, and IAP management. Patient coordinated 5 sequential PFM contractions with palpable squeeze and lift during today's session and responded positively to application of k-tape and education on home application. Patient will benefit from continued skilled therapeutic intervention to address remaining deficits in posture, postural strength/control, pain, PFM strength, and IAP management in order to increase function and improve overall QOL.     PT Long Term Goals - 05/22/21 1736       PT LONG TERM GOAL #1   Title Patient will demonstrate independence with HEP in order to maximize therapeutic gains and improve carryover from physical therapy sessions to ADLs in the home and community.    Baseline IE: not initiated    Time 12    Period Weeks    Status New    Target Date 08/14/21      PT LONG TERM GOAL #2   Title Patient will demonstrate improved function as evidenced by a score of <15 on FOTO measure for full participation in activities at home and in the community.    Baseline IE: 33    Time 12    Period Weeks    Status New  Target Date 08/14/21      PT LONG TERM GOAL #3   Title Patient will decrease worst pain as reported on NPRS by at least 2 points to demonstrate clinically significant reduction in pain in order to restore/improve function and overall QOL.    Baseline IE: back 10/10, pelvic 7/10    Time 12    Period Weeks    Status New    Target Date 08/14/21      PT LONG TERM GOAL #4   Title Patient will demonstrate circumferential and sequential contraction of >4/5 MMT, > 6 sec hold x10 and 5 consecutive quick flicks  with </= 10 min rest between testing bouts, and relaxation of the PFM coordinated with breath for improved management of intra-abdominal pressure and normal bowel and bladder function without the presence of pain nor incontinence in order to improve participation at home and in the community.    Baseline IE: not demonstrated    Time 12    Period Weeks    Status New    Target Date 08/14/21                   Plan - 06/12/21 1630     Clinical Impression Statement Patient presents to clinic with excellent motivation to participate in therapy. Patient demonstrates deficits in posture, postural strength/control, pain, PFM strength, and IAP management. Patient coordinated 5 sequential PFM contractions with palpable squeeze and lift during today's session and responded positively to application of k-tape and education on home application. Patient will benefit from continued skilled therapeutic intervention to address remaining deficits in posture, postural strength/control, pain, PFM strength, and IAP management in order to increase function and improve overall QOL.    Personal Factors and Comorbidities Age;Education;Sex;Behavior Pattern;Fitness;Time since onset of injury/illness/exacerbation;Past/Current Experience    Examination-Activity Limitations Carry;Caring for Others;Bend;Locomotion Level;Stand;Lift;Bed Mobility;Squat;Stairs;Transfers;Sit;Sleep    Examination-Participation Restrictions Laundry;Cleaning;Meal Prep;Interpersonal Relationship;Driving    Stability/Clinical Decision Making Evolving/Moderate complexity    Rehab Potential Fair    PT Frequency 1x / week    PT Duration 12 weeks    PT Treatment/Interventions ADLs/Self Care Home Management;Aquatic Therapy;Cryotherapy;Moist Heat;Stair training;Gait training;Therapeutic exercise;Balance training;Patient/family education;Therapeutic activities;Neuromuscular re-education;Manual techniques;Dry needling;Spinal Manipulations;Joint  Manipulations;Taping    PT Next Visit Plan wall posture    PT Home Exercise Plan see handouts    Consulted and Agree with Plan of Care Patient             Patient will benefit from skilled therapeutic intervention in order to improve the following deficits and impairments:  Abnormal gait, Decreased balance, Difficulty walking, Increased muscle spasms, Improper body mechanics, Decreased range of motion, Decreased activity tolerance, Postural dysfunction, Pain, Impaired flexibility, Hypermobility, Decreased strength  Visit Diagnosis: Abnormal posture  Low back pain, unspecified back pain laterality, unspecified chronicity, unspecified whether sciatica present  Muscle weakness (generalized)     Problem List Patient Active Problem List   Diagnosis Date Noted   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   History of preterm delivery 06/17/2018   History of heavy periods 01/02/2017   Dysmenorrhea 01/02/2017   Family history of endometriosis in first degree relative 01/02/2017   Family history of first-degree relative with cardiomyopathy 04/16/2016    Sheria Lang PT, DPT [phone]  06/12/2021, 6:10 PM  [place] [facility] [address] [place], [place], [address] Phone: [phone]   Fax:  [phone]  Name: Calen Posch MRN: [id number] Date of Birth: 07-01-1996

## 2021-06-17 ENCOUNTER — Other Ambulatory Visit: Payer: Self-pay | Admitting: Certified Nurse Midwife

## 2021-06-17 ENCOUNTER — Ambulatory Visit: Payer: Medicaid Other

## 2021-06-17 DIAGNOSIS — O0992 Supervision of high risk pregnancy, unspecified, second trimester: Secondary | ICD-10-CM

## 2021-06-17 DIAGNOSIS — O09899 Supervision of other high risk pregnancies, unspecified trimester: Secondary | ICD-10-CM

## 2021-06-17 DIAGNOSIS — Z363 Encounter for antenatal screening for malformations: Secondary | ICD-10-CM

## 2021-06-17 DIAGNOSIS — Z3A23 23 weeks gestation of pregnancy: Secondary | ICD-10-CM

## 2021-06-18 ENCOUNTER — Other Ambulatory Visit: Payer: Self-pay | Admitting: Certified Nurse Midwife

## 2021-06-18 ENCOUNTER — Other Ambulatory Visit: Payer: Self-pay

## 2021-06-18 ENCOUNTER — Ambulatory Visit (INDEPENDENT_AMBULATORY_CARE_PROVIDER_SITE_OTHER): Payer: Medicaid Other | Admitting: Certified Nurse Midwife

## 2021-06-18 ENCOUNTER — Ambulatory Visit: Payer: Medicaid Other | Attending: Obstetrics and Gynecology

## 2021-06-18 VITALS — BP 109/55 | HR 86 | Temp 97.6°F | Ht 63.0 in | Wt 163.0 lb

## 2021-06-18 DIAGNOSIS — O0992 Supervision of high risk pregnancy, unspecified, second trimester: Secondary | ICD-10-CM

## 2021-06-18 DIAGNOSIS — O09899 Supervision of other high risk pregnancies, unspecified trimester: Secondary | ICD-10-CM

## 2021-06-18 DIAGNOSIS — O09892 Supervision of other high risk pregnancies, second trimester: Secondary | ICD-10-CM | POA: Diagnosis not present

## 2021-06-18 DIAGNOSIS — O09212 Supervision of pregnancy with history of pre-term labor, second trimester: Secondary | ICD-10-CM | POA: Insufficient documentation

## 2021-06-18 DIAGNOSIS — Z8751 Personal history of pre-term labor: Secondary | ICD-10-CM

## 2021-06-18 DIAGNOSIS — Z3A23 23 weeks gestation of pregnancy: Secondary | ICD-10-CM | POA: Insufficient documentation

## 2021-06-18 DIAGNOSIS — Z3689 Encounter for other specified antenatal screening: Secondary | ICD-10-CM | POA: Insufficient documentation

## 2021-06-18 MED ORDER — HYDROXYPROGESTERONE CAPROATE 275 MG/1.1ML ~~LOC~~ SOAJ
275.0000 mg | Freq: Once | SUBCUTANEOUS | Status: AC
  Administered 2021-06-18: 275 mg via SUBCUTANEOUS

## 2021-06-18 NOTE — Progress Notes (Signed)
Bonnie Graham came in today for her 3rd injection this month. Her next injection will be on 06/25/21. A refill order has been placed for next months injections.

## 2021-06-19 ENCOUNTER — Ambulatory Visit: Payer: Medicaid Other | Admitting: Physical Therapy

## 2021-06-19 ENCOUNTER — Encounter: Payer: Self-pay | Admitting: Physical Therapy

## 2021-06-19 DIAGNOSIS — R293 Abnormal posture: Secondary | ICD-10-CM

## 2021-06-19 DIAGNOSIS — M6281 Muscle weakness (generalized): Secondary | ICD-10-CM

## 2021-06-19 DIAGNOSIS — M545 Low back pain, unspecified: Secondary | ICD-10-CM

## 2021-06-20 NOTE — Therapy (Signed)
[facility] [address] [place], [place], [address] Phone: [phone]   Fax:  [phone]  Physical Therapy Treatment  Patient Details  Name: Bonnie Graham MRN: [id number] Date of Birth: 1996/02/27 Referring Provider (PT): [name]   Encounter Date: 06/19/2021   PT End of Session - 06/19/21 1640     Visit Number 5    Number of Visits 12    Date for PT Re-Evaluation 08/14/21    PT Start Time 1630    PT Stop Time 1710    PT Time Calculation (min) 40 min    Activity Tolerance Patient tolerated treatment well    Behavior During Therapy [facility] for tasks assessed/performed             Past Medical History:  Diagnosis Date   Heavy periods    Painful menstrual periods     Past Surgical History:  Procedure Laterality Date   NO PAST SURGERIES      There were no vitals filed for this visit.   Subjective Assessment - 06/19/21 1633     Subjective Patient notes that she is giving her skin a break from ktape as it was causing irritation. Patient notes that she felt the ktape was offering mild support. Patient notes that the ktape did not alleviate pain. Patient reports pressure is not bad today. Patient reports that she has been practicing PFM contractions and had one instance today where the muscles would not release after the 3rd or 4th rep; patient discontinued exercise at that point.    Currently in Pain? Yes    Pain Score 0-No pain              TREATMENT  Neuromuscular Re-education: Patient education on strategies to prevent prolapse during delivery, body mechanics for decreased strain on pelvis anatomy during delivery, and positions to prepare the body for birth; provided with handouts.  Standing neutral pelvic posture at wall with towel roll feedback, coordinated breath TrA activation for improved postural awareness/control. Seated neutral pelvic posture (with and without back support/towel roll feedback)  coordinated breath TrA activation for improved postural awareness/control.   Patient educated throughout session on appropriate technique and form using multi-modal cueing, HEP, and activity modification. Patient articulated understanding and returned demonstration.  Patient Response to interventions: Comfortable to transition to every other week visits.   ASSESSMENT Patient presents to clinic with excellent motivation to participate in therapy. Patient demonstrates deficits in posture, postural strength/control, pain, PFM strength, and IAP management. Patient had increased vaginal pressure with standing postural control activity, but this was controlled in sitting posture during today's session and responded positively to educational interventions. Patient will benefit from continued skilled therapeutic intervention to address remaining deficits in posture, postural strength/control, pain, PFM strength, and IAP management in order to increase function and improve overall QOL.    PT Long Term Goals - 05/22/21 1736       PT LONG TERM GOAL #1   Title Patient will demonstrate independence with HEP in order to maximize therapeutic gains and improve carryover from physical therapy sessions to ADLs in the home and community.    Baseline IE: not initiated    Time 12    Period Weeks    Status New    Target Date 08/14/21      PT LONG TERM GOAL #2   Title Patient will demonstrate improved function as evidenced by a score of <15 on FOTO measure for full participation in activities  at home and in the community.    Baseline IE: 33    Time 12    Period Weeks    Status New    Target Date 08/14/21      PT LONG TERM GOAL #3   Title Patient will decrease worst pain as reported on NPRS by at least 2 points to demonstrate clinically significant reduction in pain in order to restore/improve function and overall QOL.    Baseline IE: back 10/10, pelvic 7/10    Time 12    Period Weeks    Status New     Target Date 08/14/21      PT LONG TERM GOAL #4   Title Patient will demonstrate circumferential and sequential contraction of >4/5 MMT, > 6 sec hold x10 and 5 consecutive quick flicks with </= 10 min rest between testing bouts, and relaxation of the PFM coordinated with breath for improved management of intra-abdominal pressure and normal bowel and bladder function without the presence of pain nor incontinence in order to improve participation at home and in the community.    Baseline IE: not demonstrated    Time 12    Period Weeks    Status New    Target Date 08/14/21                   Plan - 06/19/21 1641     Clinical Impression Statement Patient presents to clinic with excellent motivation to participate in therapy. Patient demonstrates deficits in posture, postural strength/control, pain, PFM strength, and IAP management. Patient had increased vaginal pressure with standing postural control activity, but this was controlled in sitting posture during today's session and responded positively to educational interventions. Patient will benefit from continued skilled therapeutic intervention to address remaining deficits in posture, postural strength/control, pain, PFM strength, and IAP management in order to increase function and improve overall QOL.    Personal Factors and Comorbidities Age;Education;Sex;Behavior Pattern;Fitness;Time since onset of injury/illness/exacerbation;Past/Current Experience    Examination-Activity Limitations Carry;Caring for Others;Bend;Locomotion Level;Stand;Lift;Bed Mobility;Squat;Stairs;Transfers;Sit;Sleep    Examination-Participation Restrictions Laundry;Cleaning;Meal Prep;Interpersonal Relationship;Driving    Stability/Clinical Decision Making Evolving/Moderate complexity    Rehab Potential Fair    PT Frequency 1x / week    PT Duration 12 weeks    PT Treatment/Interventions ADLs/Self Care Home Management;Aquatic Therapy;Cryotherapy;Moist Heat;Stair  training;Gait training;Therapeutic exercise;Balance training;Patient/family education;Therapeutic activities;Neuromuscular re-education;Manual techniques;Dry needling;Spinal Manipulations;Joint Manipulations;Taping    PT Next Visit Plan --    PT Home Exercise Plan see handouts    Consulted and Agree with Plan of Care Patient             Patient will benefit from skilled therapeutic intervention in order to improve the following deficits and impairments:  Abnormal gait, Decreased balance, Difficulty walking, Increased muscle spasms, Improper body mechanics, Decreased range of motion, Decreased activity tolerance, Postural dysfunction, Pain, Impaired flexibility, Hypermobility, Decreased strength  Visit Diagnosis: Abnormal posture  Low back pain, unspecified back pain laterality, unspecified chronicity, unspecified whether sciatica present  Muscle weakness (generalized)     Problem List Patient Active Problem List   Diagnosis Date Noted   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   History of preterm delivery 06/17/2018   History of heavy periods 01/02/2017   Dysmenorrhea 01/02/2017   Family history of endometriosis in first degree relative 01/02/2017   Family history of first-degree relative with cardiomyopathy 04/16/2016    Sheria Lang PT, DPT [phone]  06/20/2021, 9:02 AM  [facility] [address] [place], [place],  [address] Phone: [phone]   Fax:  [phone]  Name: Laiken Sandy MRN: [id number] Date of Birth: 05-Jul-1996

## 2021-06-24 ENCOUNTER — Ambulatory Visit (INDEPENDENT_AMBULATORY_CARE_PROVIDER_SITE_OTHER): Payer: Medicaid Other | Admitting: Certified Nurse Midwife

## 2021-06-24 ENCOUNTER — Other Ambulatory Visit: Payer: Self-pay

## 2021-06-24 DIAGNOSIS — Z8751 Personal history of pre-term labor: Secondary | ICD-10-CM

## 2021-06-24 MED ORDER — HYDROXYPROGESTERONE CAPROATE 275 MG/1.1ML ~~LOC~~ SOAJ
275.0000 mg | Freq: Once | SUBCUTANEOUS | Status: AC
  Administered 2021-06-24: 275 mg via SUBCUTANEOUS

## 2021-06-26 ENCOUNTER — Encounter: Payer: Medicaid Other | Admitting: Physical Therapy

## 2021-07-01 ENCOUNTER — Ambulatory Visit (INDEPENDENT_AMBULATORY_CARE_PROVIDER_SITE_OTHER): Payer: Medicaid Other | Admitting: Certified Nurse Midwife

## 2021-07-01 ENCOUNTER — Other Ambulatory Visit: Payer: Self-pay

## 2021-07-01 VITALS — BP 120/78 | HR 80 | Wt 165.5 lb

## 2021-07-01 DIAGNOSIS — O0993 Supervision of high risk pregnancy, unspecified, third trimester: Secondary | ICD-10-CM | POA: Diagnosis not present

## 2021-07-01 DIAGNOSIS — Z3A24 24 weeks gestation of pregnancy: Secondary | ICD-10-CM | POA: Diagnosis not present

## 2021-07-01 LAB — POCT URINALYSIS DIPSTICK OB
Bilirubin, UA: NEGATIVE
Blood, UA: NEGATIVE
Glucose, UA: NEGATIVE
Ketones, UA: NEGATIVE
Leukocytes, UA: NEGATIVE
Nitrite, UA: NEGATIVE
POC,PROTEIN,UA: NEGATIVE
Spec Grav, UA: 1.015 (ref 1.010–1.025)
Urobilinogen, UA: 0.2 E.U./dL
pH, UA: 7 (ref 5.0–8.0)

## 2021-07-01 MED ORDER — HYDROXYPROGESTERONE CAPROATE 275 MG/1.1ML ~~LOC~~ SOAJ
275.0000 mg | Freq: Once | SUBCUTANEOUS | Status: AC
  Administered 2021-07-01: 275 mg via SUBCUTANEOUS

## 2021-07-01 NOTE — Patient Instructions (Signed)
Oral Glucose Tolerance Test During Pregnancy °Why am I having this test? °The oral glucose tolerance test (OGTT) is done to check how your body processes blood sugar (glucose). This is one of several tests used to diagnose diabetes that develops during pregnancy (gestational diabetes mellitus). Gestational diabetes is a short-term form of diabetes that some women develop while they are pregnant. It usually occurs during the second trimester of pregnancy and goes away after delivery. °Testing, or screening, for gestational diabetes usually occurs at weeks 24-28 of pregnancy. You may have the OGTT test after having a 1-hour glucose screening test if the results from that test indicate that you may have gestational diabetes. This test may also be needed if: °You have a history of gestational diabetes. °There is a history of giving birth to very large babies or of losing pregnancies (having stillbirths). °You have signs and symptoms of diabetes, such as: °Changes in your eyesight. °Tingling or numbness in your hands or feet. °Changes in hunger, thirst, and urination, and these are not explained by your pregnancy. °What is being tested? °This test measures the amount of glucose in your blood at different times during a period of 3 hours. This shows how well your body can process glucose. °What kind of sample is taken? °Blood samples are required for this test. They are usually collected by inserting a needle into a blood vessel. °How do I prepare for this test? °For 3 days before your test, eat normally. Have plenty of carbohydrate-rich foods. °Follow instructions from your health care provider about: °Eating or drinking restrictions on the day of the test. You may be asked not to eat or drink anything other than water (to fast) starting 8-10 hours before the test. °Changing or stopping your regular medicines. Some medicines may interfere with this test. °Tell a health care provider about: °All medicines you are taking,  including vitamins, herbs, eye drops, creams, and over-the-counter medicines. °Any blood disorders you have. °Any surgeries you have had. °Any medical conditions you have. °What happens during the test? °First, your blood glucose will be measured. This is referred to as your fasting blood glucose because you fasted before the test. Then, you will drink a glucose solution that contains a certain amount of glucose. Your blood glucose will be measured again 1, 2, and 3 hours after you drink the solution. °This test takes about 3 hours to complete. You will need to stay at the testing location during this time. During the testing period: °Do not eat or drink anything other than the glucose solution. °Do not exercise. °Do not use any products that contain nicotine or tobacco, such as cigarettes, e-cigarettes, and chewing tobacco. These can affect your test results. If you need help quitting, ask your health care provider. °The testing procedure may vary among health care providers and hospitals. °How are the results reported? °Your results will be reported as milligrams of glucose per deciliter of blood (mg/dL) or millimoles per liter (mmol/L). There is more than one source for screening and diagnosis reference values used to diagnose gestational diabetes. Your health care provider will compare your results to normal values that were established after testing a large group of people (reference values). Reference values may vary among labs and hospitals. For this test (Carpenter-Coustan), reference values are: °Fasting: 95 mg/dL (5.3 mmol/L). °1 hour: 180 mg/dL (10.0 mmol/L). °2 hour: 155 mg/dL (8.6 mmol/L). °3 hour: 140 mg/dL (7.8 mmol/L). °What do the results mean? °Results below the reference values are considered   normal. If two or more of your blood glucose levels are at or above the reference values, you may be diagnosed with gestational diabetes. If only one level is high, your health care provider may suggest  repeat testing or other tests to confirm a diagnosis. °Talk with your health care provider about what your results mean. °Questions to ask your health care provider °Ask your health care provider, or the department that is doing the test: °When will my results be ready? °How will I get my results? °What are my treatment options? °What other tests do I need? °What are my next steps? °Summary °The oral glucose tolerance test (OGTT) is one of several tests used to diagnose diabetes that develops during pregnancy (gestational diabetes mellitus). Gestational diabetes is a short-term form of diabetes that some women develop while they are pregnant. °You may have the OGTT test after having a 1-hour glucose screening test if the results from that test show that you may have gestational diabetes. You may also have this test if you have any symptoms or risk factors for this type of diabetes. °Talk with your health care provider about what your results mean. °This information is not intended to replace advice given to you by your health care provider. Make sure you discuss any questions you have with your health care provider. °Document Revised: 04/05/2020 Document Reviewed: 04/05/2020 °Elsevier Patient Education © 2022 Elsevier Inc. ° °

## 2021-07-01 NOTE — Progress Notes (Signed)
ROB doing well. Feels good movement. Discussed Glucose testing next visit. Pt interested in using The fresh test for testing rather than the routine glucola. Will discussed with MDs to see if agreeable. She is interested in doula services. Discussed going to [employer] website to complete doula form. Will give pamphlet at next visit. She will return in 1 wk for Mkena injections in 4 wks for ROB.   [name], CNM

## 2021-07-03 ENCOUNTER — Encounter: Payer: Medicaid Other | Admitting: Physical Therapy

## 2021-07-08 ENCOUNTER — Other Ambulatory Visit: Payer: Self-pay

## 2021-07-08 ENCOUNTER — Ambulatory Visit (INDEPENDENT_AMBULATORY_CARE_PROVIDER_SITE_OTHER): Payer: Medicaid Other

## 2021-07-08 DIAGNOSIS — Z8751 Personal history of pre-term labor: Secondary | ICD-10-CM | POA: Diagnosis not present

## 2021-07-08 MED ORDER — HYDROXYPROGESTERONE CAPROATE 275 MG/1.1ML ~~LOC~~ SOAJ
275.0000 mg | Freq: Once | SUBCUTANEOUS | Status: AC
  Administered 2021-07-08: 275 mg via SUBCUTANEOUS

## 2021-07-08 NOTE — Progress Notes (Signed)
Pt present for 17P injection. Pt denies any signs of preterm labor. Pt stated that she was doing well.

## 2021-07-10 ENCOUNTER — Other Ambulatory Visit: Payer: Self-pay

## 2021-07-10 ENCOUNTER — Encounter: Payer: Self-pay | Admitting: Physical Therapy

## 2021-07-10 ENCOUNTER — Ambulatory Visit: Payer: Medicaid Other | Admitting: Physical Therapy

## 2021-07-10 DIAGNOSIS — R293 Abnormal posture: Secondary | ICD-10-CM

## 2021-07-10 DIAGNOSIS — M6281 Muscle weakness (generalized): Secondary | ICD-10-CM

## 2021-07-10 DIAGNOSIS — M545 Low back pain, unspecified: Secondary | ICD-10-CM

## 2021-07-10 NOTE — Therapy (Signed)
[place] [facility] [address] [place], [place], [address] Phone: [phone]   Fax:  [phone]  Physical Therapy Treatment  Patient Details  Name: Bonnie Graham MRN: [id number] Date of Birth: July 15, 1996 Referring Provider (PT): [name]   Encounter Date: 07/10/2021   PT End of Session - 07/10/21 1721     Visit Number 6    Number of Visits 12    Date for PT Re-Evaluation 08/14/21    PT Start Time 1630    PT Stop Time 1710    PT Time Calculation (min) 40 min    Activity Tolerance Patient tolerated treatment well    Behavior During Therapy [facility] for tasks assessed/performed             Past Medical History:  Diagnosis Date   Heavy periods    Painful menstrual periods     Past Surgical History:  Procedure Laterality Date   NO PAST SURGERIES      There were no vitals filed for this visit.   Subjective Assessment - 07/10/21 1631     Subjective Patient notes no new changes. Reports pressure has not gotten worse, but does continue to feel increased pressure during sleep cycle with bed mobility. Patient also notes that she is having increased stiffness in the morning where back feels locked when she initially walks. This same sensation also comes on with STS transfers from the couch after prolonged duration.    Currently in Pain? No/denies             TREATMENT  Neuromuscular Re-education: Sidelying QL stretch with OH reach for improved spinal mobility and postural awareness, B Sidelying clamshell with limited ROM for improved pelvic control and mobility, B Sidelying open book with coordinated breath for improved spinal mobility and pain modulation, B Sidelying knee to chest stretch for improved spinal mobility and pain modulation, B Body mechanics education and practice for STS from a low surface including preparatory movements: B OH reach with thoracic extension, seated pelvic tilts, hip hinge, and  "knack"/exhale with Gower's maneuver to achieve erect posture.   Patient educated throughout session on appropriate technique and form using multi-modal cueing, HEP, and activity modification. Patient articulated understanding and returned demonstration.  Patient Response to interventions: Felt most relief with open book and knee to chest stretches  ASSESSMENT Patient presents to clinic with excellent motivation to participate in therapy. Patient demonstrates deficits in posture, postural strength/control, pain, PFM strength, and IAP management. Patient able to perform body mechanics sequence with preparatory movements with minimal to no cueing and good effect during today's session and responded positively to active interventions. Patient will benefit from continued skilled therapeutic intervention to address remaining deficits in posture, postural strength/control, pain, PFM strength, and IAP management in order to increase function and improve overall QOL.     PT Long Term Goals - 05/22/21 1736       PT LONG TERM GOAL #1   Title Patient will demonstrate independence with HEP in order to maximize therapeutic gains and improve carryover from physical therapy sessions to ADLs in the home and community.    Baseline IE: not initiated    Time 12    Period Weeks    Status New    Target Date 08/14/21      PT LONG TERM GOAL #2   Title Patient will demonstrate improved function as evidenced by a score of <15 on FOTO measure for full participation in activities at home  and in the community.    Baseline IE: 33    Time 12    Period Weeks    Status New    Target Date 08/14/21      PT LONG TERM GOAL #3   Title Patient will decrease worst pain as reported on NPRS by at least 2 points to demonstrate clinically significant reduction in pain in order to restore/improve function and overall QOL.    Baseline IE: back 10/10, pelvic 7/10    Time 12    Period Weeks    Status New    Target Date  08/14/21      PT LONG TERM GOAL #4   Title Patient will demonstrate circumferential and sequential contraction of >4/5 MMT, > 6 sec hold x10 and 5 consecutive quick flicks with </= 10 min rest between testing bouts, and relaxation of the PFM coordinated with breath for improved management of intra-abdominal pressure and normal bowel and bladder function without the presence of pain nor incontinence in order to improve participation at home and in the community.    Baseline IE: not demonstrated    Time 12    Period Weeks    Status New    Target Date 08/14/21                   Plan - 07/10/21 1721     Clinical Impression Statement Patient presents to clinic with excellent motivation to participate in therapy. Patient demonstrates deficits in posture, postural strength/control, pain, PFM strength, and IAP management. Patient able to perform body mechanics sequence with preparatory movements with minimal to no cueing and good effect during today's session and responded positively to active interventions. Patient will benefit from continued skilled therapeutic intervention to address remaining deficits in posture, postural strength/control, pain, PFM strength, and IAP management in order to increase function and improve overall QOL.    Personal Factors and Comorbidities Age;Education;Sex;Behavior Pattern;Fitness;Time since onset of injury/illness/exacerbation;Past/Current Experience    Examination-Activity Limitations Carry;Caring for Others;Bend;Locomotion Level;Stand;Lift;Bed Mobility;Squat;Stairs;Transfers;Sit;Sleep    Examination-Participation Restrictions Laundry;Cleaning;Meal Prep;Interpersonal Relationship;Driving    Stability/Clinical Decision Making Evolving/Moderate complexity    Rehab Potential Fair    PT Frequency 1x / week    PT Duration 12 weeks    PT Treatment/Interventions ADLs/Self Care Home Management;Aquatic Therapy;Cryotherapy;Moist Heat;Stair training;Gait  training;Therapeutic exercise;Balance training;Patient/family education;Therapeutic activities;Neuromuscular re-education;Manual techniques;Dry needling;Spinal Manipulations;Joint Manipulations;Taping    PT Home Exercise Plan [id number]    Consulted and Agree with Plan of Care Patient             Patient will benefit from skilled therapeutic intervention in order to improve the following deficits and impairments:  Abnormal gait, Decreased balance, Difficulty walking, Increased muscle spasms, Improper body mechanics, Decreased range of motion, Decreased activity tolerance, Postural dysfunction, Pain, Impaired flexibility, Hypermobility, Decreased strength  Visit Diagnosis: Abnormal posture  Low back pain, unspecified back pain laterality, unspecified chronicity, unspecified whether sciatica present  Muscle weakness (generalized)     Problem List Patient Active Problem List   Diagnosis Date Noted   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

## 2021-07-16 ENCOUNTER — Ambulatory Visit (INDEPENDENT_AMBULATORY_CARE_PROVIDER_SITE_OTHER): Payer: Medicaid Other | Admitting: Certified Nurse Midwife

## 2021-07-16 ENCOUNTER — Other Ambulatory Visit: Payer: Self-pay

## 2021-07-16 DIAGNOSIS — Z8751 Personal history of pre-term labor: Secondary | ICD-10-CM

## 2021-07-16 MED ORDER — HYDROXYPROGESTERONE CAPROATE 275 MG/1.1ML ~~LOC~~ SOAJ
275.0000 mg | Freq: Once | SUBCUTANEOUS | Status: AC
  Administered 2021-07-16: 275 mg via SUBCUTANEOUS

## 2021-07-16 NOTE — Progress Notes (Signed)
Patient presents for routine Makena injection. No questions/concerns voiced by patient, no adverse reactions.

## 2021-07-24 ENCOUNTER — Ambulatory Visit (INDEPENDENT_AMBULATORY_CARE_PROVIDER_SITE_OTHER): Payer: Medicaid Other | Admitting: Certified Nurse Midwife

## 2021-07-24 ENCOUNTER — Other Ambulatory Visit: Payer: Self-pay

## 2021-07-24 ENCOUNTER — Encounter: Payer: Self-pay | Admitting: Certified Nurse Midwife

## 2021-07-24 ENCOUNTER — Other Ambulatory Visit: Payer: Medicaid Other

## 2021-07-24 VITALS — BP 107/72 | HR 96 | Wt 172.2 lb

## 2021-07-24 DIAGNOSIS — Z3403 Encounter for supervision of normal first pregnancy, third trimester: Secondary | ICD-10-CM

## 2021-07-24 DIAGNOSIS — O09899 Supervision of other high risk pregnancies, unspecified trimester: Secondary | ICD-10-CM

## 2021-07-24 DIAGNOSIS — Z3A28 28 weeks gestation of pregnancy: Secondary | ICD-10-CM

## 2021-07-24 LAB — POCT URINALYSIS DIPSTICK OB
Bilirubin, UA: NEGATIVE
Blood, UA: NEGATIVE
Glucose, UA: NEGATIVE
Ketones, UA: NEGATIVE
Leukocytes, UA: NEGATIVE
Nitrite, UA: NEGATIVE
Spec Grav, UA: 1.015 (ref 1.010–1.025)
Urobilinogen, UA: 0.2 E.U./dL
pH, UA: 7 (ref 5.0–8.0)

## 2021-07-24 MED ORDER — HYDROXYPROGESTERONE CAPROATE 275 MG/1.1ML ~~LOC~~ SOAJ
275.0000 mg | Freq: Once | SUBCUTANEOUS | Status: AC
  Administered 2021-07-24: 275 mg via SUBCUTANEOUS

## 2021-07-24 NOTE — Progress Notes (Signed)
ROB doing well. Feels good movement. 28 wk labs today: Glucose screen/RPR/CBC. Tdap declined, Blood transfusion consent completed, all questions answered. Ready set baby reviewed, see check list for topics covered. Sample birth plan given, will follow up in upcoming visits. Discussed birth control after delivery, information pamphlet given.  Discussed visit with Mds. She states she met Dr. Amalia Hailey previously and is fine with not seeing them.   Follow up 2 wk  ROB or sooner if needed.    Philip Aspen, CNM

## 2021-07-24 NOTE — Patient Instructions (Signed)
Oral Glucose Tolerance Test During Pregnancy °Why am I having this test? °The oral glucose tolerance test (OGTT) is done to check how your body processes blood sugar (glucose). This is one of several tests used to diagnose diabetes that develops during pregnancy (gestational diabetes mellitus). Gestational diabetes is a short-term form of diabetes that some women develop while they are pregnant. It usually occurs during the second trimester of pregnancy and goes away after delivery. °Testing, or screening, for gestational diabetes usually occurs at weeks 24-28 of pregnancy. You may have the OGTT test after having a 1-hour glucose screening test if the results from that test indicate that you may have gestational diabetes. This test may also be needed if: °You have a history of gestational diabetes. °There is a history of giving birth to very large babies or of losing pregnancies (having stillbirths). °You have signs and symptoms of diabetes, such as: °Changes in your eyesight. °Tingling or numbness in your hands or feet. °Changes in hunger, thirst, and urination, and these are not explained by your pregnancy. °What is being tested? °This test measures the amount of glucose in your blood at different times during a period of 3 hours. This shows how well your body can process glucose. °What kind of sample is taken? °Blood samples are required for this test. They are usually collected by inserting a needle into a blood vessel. °How do I prepare for this test? °For 3 days before your test, eat normally. Have plenty of carbohydrate-rich foods. °Follow instructions from your health care provider about: °Eating or drinking restrictions on the day of the test. You may be asked not to eat or drink anything other than water (to fast) starting 8-10 hours before the test. °Changing or stopping your regular medicines. Some medicines may interfere with this test. °Tell a health care provider about: °All medicines you are taking,  including vitamins, herbs, eye drops, creams, and over-the-counter medicines. °Any blood disorders you have. °Any surgeries you have had. °Any medical conditions you have. °What happens during the test? °First, your blood glucose will be measured. This is referred to as your fasting blood glucose because you fasted before the test. Then, you will drink a glucose solution that contains a certain amount of glucose. Your blood glucose will be measured again 1, 2, and 3 hours after you drink the solution. °This test takes about 3 hours to complete. You will need to stay at the testing location during this time. During the testing period: °Do not eat or drink anything other than the glucose solution. °Do not exercise. °Do not use any products that contain nicotine or tobacco, such as cigarettes, e-cigarettes, and chewing tobacco. These can affect your test results. If you need help quitting, ask your health care provider. °The testing procedure may vary among health care providers and hospitals. °How are the results reported? °Your results will be reported as milligrams of glucose per deciliter of blood (mg/dL) or millimoles per liter (mmol/L). There is more than one source for screening and diagnosis reference values used to diagnose gestational diabetes. Your health care provider will compare your results to normal values that were established after testing a large group of people (reference values). Reference values may vary among labs and hospitals. For this test (Carpenter-Coustan), reference values are: °Fasting: 95 mg/dL (5.3 mmol/L). °1 hour: 180 mg/dL (10.0 mmol/L). °2 hour: 155 mg/dL (8.6 mmol/L). °3 hour: 140 mg/dL (7.8 mmol/L). °What do the results mean? °Results below the reference values are considered   normal. If two or more of your blood glucose levels are at or above the reference values, you may be diagnosed with gestational diabetes. If only one level is high, your health care provider may suggest  repeat testing or other tests to confirm a diagnosis. °Talk with your health care provider about what your results mean. °Questions to ask your health care provider °Ask your health care provider, or the department that is doing the test: °When will my results be ready? °How will I get my results? °What are my treatment options? °What other tests do I need? °What are my next steps? °Summary °The oral glucose tolerance test (OGTT) is one of several tests used to diagnose diabetes that develops during pregnancy (gestational diabetes mellitus). Gestational diabetes is a short-term form of diabetes that some women develop while they are pregnant. °You may have the OGTT test after having a 1-hour glucose screening test if the results from that test show that you may have gestational diabetes. You may also have this test if you have any symptoms or risk factors for this type of diabetes. °Talk with your health care provider about what your results mean. °This information is not intended to replace advice given to you by your health care provider. Make sure you discuss any questions you have with your health care provider. °Document Revised: 04/05/2020 Document Reviewed: 04/05/2020 °Elsevier Patient Education © 2022 Elsevier Inc. ° °

## 2021-07-24 NOTE — Progress Notes (Signed)
ROB: She is doing well today. No new concerns. BTC form signed. Unsure about TDAP. Makena given today.

## 2021-07-25 LAB — CBC
Hematocrit: 33.6 % — ABNORMAL LOW (ref 34.0–46.6)
Hemoglobin: 11 g/dL — ABNORMAL LOW (ref 11.1–15.9)
MCH: 28.9 pg (ref 26.6–33.0)
MCHC: 32.7 g/dL (ref 31.5–35.7)
MCV: 88 fL (ref 79–97)
Platelets: 312 10*3/uL (ref 150–450)
RBC: 3.81 x10E6/uL (ref 3.77–5.28)
RDW: 13.1 % (ref 11.7–15.4)
WBC: 11.8 10*3/uL — ABNORMAL HIGH (ref 3.4–10.8)

## 2021-07-25 LAB — GLUCOSE, 1 HOUR GESTATIONAL: Gestational Diabetes Screen: 75 mg/dL (ref 65–139)

## 2021-07-25 LAB — RPR: RPR Ser Ql: NONREACTIVE

## 2021-07-31 ENCOUNTER — Ambulatory Visit: Payer: Medicaid Other | Attending: Certified Nurse Midwife | Admitting: Physical Therapy

## 2021-07-31 ENCOUNTER — Other Ambulatory Visit: Payer: Self-pay

## 2021-07-31 ENCOUNTER — Encounter: Payer: Self-pay | Admitting: Physical Therapy

## 2021-07-31 DIAGNOSIS — M6281 Muscle weakness (generalized): Secondary | ICD-10-CM | POA: Insufficient documentation

## 2021-07-31 DIAGNOSIS — R293 Abnormal posture: Secondary | ICD-10-CM | POA: Insufficient documentation

## 2021-07-31 DIAGNOSIS — M545 Low back pain, unspecified: Secondary | ICD-10-CM | POA: Insufficient documentation

## 2021-07-31 NOTE — Therapy (Addendum)
[facility] [address] [place], [place], [address] Phone: [phone]   Fax:  [phone]  Physical Therapy Treatment  Patient Details  Name: Bonnie Graham MRN: [id number] Date of Birth: 12/26/1995 Referring Provider (PT): [name]   Encounter Date: 07/31/2021     Past Medical History:  Diagnosis Date   Heavy periods    Painful menstrual periods     Past Surgical History:  Procedure Laterality Date   NO PAST SURGERIES      There were no vitals filed for this visit.    TREATMENT Pre-treatment Assessment: L posterior pelvic rotation, increased lumbar lordosis, hyperextension strategy for chair rise  Manual Therapy: STM and TPR performed to BLE, B glutes, and B lumbar paraspinals to allow for decreased tension and pain and improved posture and function L sidelying lumbar UPA mobilizations for decreased spasm and improved mobility, grade II/III  Neuromuscular Re-education: Patient education on body mechanics with bed mobility and transfers with abdominal support for decreased incidence of back pain.  Patient education on sleep postures with pillow supports for decreased round ligament pain.  Patient reviewed use of maternity support belt for improved postural endurance and decreased pain. Patient requesting further instruction.    Patient educated throughout session on appropriate technique and form using multi-modal cueing, HEP, and activity modification. Patient articulated understanding and returned demonstration.  Patient Response to interventions: Comfortable to return in 2 weeks; will attempt to wear support belt with standing activities.  ASSESSMENT Patient presents to clinic with excellent motivation to participate in therapy. Patient continues to demonstrate deficits in posture, postural strength/control, pain, PFM strength, and IAP management. Patient had increased tenderness at R calf and L  thoracolumbar paraspinals with STM during today's session and responded positively to educational interventions. Patient has responded well within session to manual and educational interventions as well as active. Patient does have continued difficulty with body mechanics and posture requiring further intervention. Additionally, due to the progressive nature of pregnancy and current moderate success at managing pain, patient will need further skilled care to maintain function and independence with caregiving duties for other children. Patient's condition has the potential to improve in response to therapy. Maximum improvement is yet to be obtained. The anticipated improvement is attainable and reasonable in a generally predictable time. Patient will benefit from continued skilled therapeutic intervention to address remaining deficits in posture, postural strength/control, pain, PFM strength, and IAP management in order to increase function and improve overall QOL.    PT Long Term Goals - 08/02/21 1056       PT LONG TERM GOAL #1   Title Patient will demonstrate independence with HEP in order to maximize therapeutic gains and improve carryover from physical therapy sessions to ADLs in the home and community.    Baseline IE: not initiated; 9/21: IND    Time 12    Period Weeks    Status Achieved      PT LONG TERM GOAL #2   Title Patient will demonstrate improved function as evidenced by a score of <15 on FOTO measure for full participation in activities at home and in the community.    Baseline IE: 33    Time 12    Period Weeks    Status New      PT LONG TERM GOAL #3   Title Patient will decrease worst pain as reported on NPRS by at least 2 points to demonstrate clinically significant reduction in pain in order to  restore/improve function and overall QOL.    Baseline IE: back 10/10, pelvic 7/10; 9/21: back 10/10 with transfers    Time 12    Period Weeks    Status On-going    Target Date  08/14/21      PT LONG TERM GOAL #4   Title Patient will demonstrate circumferential and sequential contraction of >4/5 MMT, > 6 sec hold x10 and 5 consecutive quick flicks with </= 10 min rest between testing bouts, and relaxation of the PFM coordinated with breath for improved management of intra-abdominal pressure and normal bowel and bladder function without the presence of pain nor incontinence in order to improve participation at home and in the community.    Baseline IE: not demonstrated    Time 12    Period Weeks    Status On-going    Target Date 08/14/21                     Patient will benefit from skilled therapeutic intervention in order to improve the following deficits and impairments:  Abnormal gait, Decreased balance, Difficulty walking, Increased muscle spasms, Improper body mechanics, Decreased range of motion, Decreased activity tolerance, Postural dysfunction, Pain, Impaired flexibility, Hypermobility, Decreased strength  Visit Diagnosis: Abnormal posture  Low back pain, unspecified back pain laterality, unspecified chronicity, unspecified whether sciatica present  Muscle weakness (generalized)     Problem List Patient Active Problem List   Diagnosis Date Noted   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 [place], [place], [address] Phone: [phone]   Fax:  [phone]  Name: Shruthi Northrup MRN: [id number] Date of Birth: [birth year]

## 2021-08-01 ENCOUNTER — Ambulatory Visit (INDEPENDENT_AMBULATORY_CARE_PROVIDER_SITE_OTHER): Payer: Medicaid Other

## 2021-08-01 DIAGNOSIS — O09899 Supervision of other high risk pregnancies, unspecified trimester: Secondary | ICD-10-CM

## 2021-08-01 DIAGNOSIS — O09292 Supervision of pregnancy with other poor reproductive or obstetric history, second trimester: Secondary | ICD-10-CM | POA: Diagnosis not present

## 2021-08-01 DIAGNOSIS — Z3483 Encounter for supervision of other normal pregnancy, third trimester: Secondary | ICD-10-CM | POA: Diagnosis not present

## 2021-08-01 DIAGNOSIS — Z3A29 29 weeks gestation of pregnancy: Secondary | ICD-10-CM | POA: Diagnosis not present

## 2021-08-01 MED ORDER — HYDROXYPROGESTERONE CAPROATE 275 MG/1.1ML ~~LOC~~ SOAJ
275.0000 mg | Freq: Once | SUBCUTANEOUS | Status: AC
  Administered 2021-08-01: 275 mg via SUBCUTANEOUS

## 2021-08-01 NOTE — Progress Notes (Signed)
OB Pt present for 17P Makena injection. Pt denies any signs or symptoms of preterm labor. Pt stated that she was doing well. Pt requested a rx of Magnesium oxide 400mg . Pt stated that she was prescribed the medication during her last pregnancy for leg cramps.   Patient supplied medication.

## 2021-08-02 ENCOUNTER — Other Ambulatory Visit: Payer: Self-pay | Admitting: Certified Nurse Midwife

## 2021-08-02 MED ORDER — MAGNESIUM OXIDE 400 MG PO CAPS: 1.0000 | ORAL_CAPSULE | Freq: Every day | ORAL | 4 refills | Status: DC

## 2021-08-07 ENCOUNTER — Other Ambulatory Visit: Payer: Self-pay

## 2021-08-07 ENCOUNTER — Ambulatory Visit (INDEPENDENT_AMBULATORY_CARE_PROVIDER_SITE_OTHER): Payer: Medicaid Other | Admitting: Certified Nurse Midwife

## 2021-08-07 VITALS — BP 106/69 | HR 94 | Wt 172.9 lb

## 2021-08-07 DIAGNOSIS — Z3483 Encounter for supervision of other normal pregnancy, third trimester: Secondary | ICD-10-CM

## 2021-08-07 DIAGNOSIS — O09293 Supervision of pregnancy with other poor reproductive or obstetric history, third trimester: Secondary | ICD-10-CM

## 2021-08-07 DIAGNOSIS — Z3A30 30 weeks gestation of pregnancy: Secondary | ICD-10-CM

## 2021-08-07 DIAGNOSIS — O09899 Supervision of other high risk pregnancies, unspecified trimester: Secondary | ICD-10-CM | POA: Diagnosis not present

## 2021-08-07 LAB — POCT URINALYSIS DIPSTICK OB
Bilirubin, UA: NEGATIVE
Blood, UA: NEGATIVE
Glucose, UA: NEGATIVE
Ketones, UA: NEGATIVE
Leukocytes, UA: NEGATIVE
Nitrite, UA: NEGATIVE
POC,PROTEIN,UA: NEGATIVE
Spec Grav, UA: 1.01 (ref 1.010–1.025)
Urobilinogen, UA: 0.2 E.U./dL
pH, UA: 6.5 (ref 5.0–8.0)

## 2021-08-07 MED ORDER — HYDROXYPROGESTERONE CAPROATE 275 MG/1.1ML ~~LOC~~ SOAJ
275.0000 mg | Freq: Once | SUBCUTANEOUS | Status: AC
  Administered 2021-08-07: 275 mg via SUBCUTANEOUS

## 2021-08-07 NOTE — Addendum Note (Signed)
Addended by: Lady Deutscher on: 08/07/2021 08:55 AM   Modules accepted: Orders

## 2021-08-07 NOTE — Progress Notes (Signed)
ROB doing well, feeling regular fetal movement. Discussed birth plan , She is interested in natural birth, considering water birth. PT instructed to complete class. Copy of birth plan scanned to chart. Plans condoms for [facility]. Follow up 2 wk   [name], CNM

## 2021-08-14 ENCOUNTER — Encounter: Payer: Self-pay | Admitting: Physical Therapy

## 2021-08-15 ENCOUNTER — Ambulatory Visit (INDEPENDENT_AMBULATORY_CARE_PROVIDER_SITE_OTHER): Payer: Medicaid Other

## 2021-08-15 ENCOUNTER — Other Ambulatory Visit: Payer: Self-pay

## 2021-08-15 DIAGNOSIS — O09899 Supervision of other high risk pregnancies, unspecified trimester: Secondary | ICD-10-CM

## 2021-08-15 MED ORDER — HYDROXYPROGESTERONE CAPROATE 275 MG/1.1ML ~~LOC~~ SOAJ
275.0000 mg | Freq: Once | SUBCUTANEOUS | Status: AC
  Administered 2021-08-15: 275 mg via SUBCUTANEOUS

## 2021-08-15 NOTE — Progress Notes (Signed)
OB Pt present for Makena 17P injeciton. Pt denies any signs or symptoms of preterm labor. Medication was administered and patient tolerated well.

## 2021-08-20 ENCOUNTER — Other Ambulatory Visit: Payer: Self-pay

## 2021-08-20 ENCOUNTER — Ambulatory Visit (INDEPENDENT_AMBULATORY_CARE_PROVIDER_SITE_OTHER): Payer: Medicaid Other | Admitting: Certified Nurse Midwife

## 2021-08-20 VITALS — BP 113/78 | HR 96 | Wt 172.5 lb

## 2021-08-20 DIAGNOSIS — O09899 Supervision of other high risk pregnancies, unspecified trimester: Secondary | ICD-10-CM

## 2021-08-20 DIAGNOSIS — Z3A32 32 weeks gestation of pregnancy: Secondary | ICD-10-CM

## 2021-08-20 DIAGNOSIS — Z3483 Encounter for supervision of other normal pregnancy, third trimester: Secondary | ICD-10-CM

## 2021-08-20 LAB — POCT URINALYSIS DIPSTICK OB
Bilirubin, UA: NEGATIVE
Blood, UA: NEGATIVE
Glucose, UA: NEGATIVE
Ketones, UA: NEGATIVE
Leukocytes, UA: NEGATIVE
Nitrite, UA: 0.2
POC,PROTEIN,UA: NEGATIVE
Spec Grav, UA: 1.01 (ref 1.010–1.025)
pH, UA: 6.5 (ref 5.0–8.0)

## 2021-08-20 MED ORDER — HYDROXYPROGESTERONE CAPROATE 275 MG/1.1ML ~~LOC~~ SOAJ
275.0000 mg | Freq: Once | SUBCUTANEOUS | Status: AC
  Administered 2021-08-20: 275 mg via SUBCUTANEOUS

## 2021-08-20 NOTE — Progress Notes (Signed)
ROB doing well, feeling regular movement. She signed up for water birth class next week. Pt instructed to bring in certificate upon completion. Follow up 2 wk for rob.   [name], CNM

## 2021-08-20 NOTE — Patient Instructions (Signed)
Fetal Movement Counts Patient Name: ________________________________________________ Patient DueDate: ____________________ What is a fetal movement count?  A fetal movement count is the number of times that you feel your baby move during a certain amount of time. This may also be called a fetal kick count. A fetal movement count is recommended for every pregnant woman. You may be askedto start counting fetal movements as early as week 28 of your pregnancy. Pay attention to when your baby is most active. You may notice your baby's sleep and wake cycles. You may also notice things that make your baby move more. You should do a fetal movement count: When your baby is normally most active. At the same time each day. A good time to count movements is while you are resting, after having somethingto eat and drink. How do I count fetal movements? Find a quiet, comfortable area. Sit, or lie down on your side. Write down the date, the start time and stop time, and the number of movements that you felt between those two times. Take this information with you to your health care visits. Write down your start time when you feel the first movement. Count kicks, flutters, swishes, rolls, and jabs. You should feel at least 10 movements. You may stop counting after you have felt 10 movements, or if you have been counting for 2 hours. Write down the stop time. If you do not feel 10 movements in 2 hours, contact your health care provider for further instructions. Your health care provider may want to do additional tests to assess your baby's well-being. Contact a health care provider if: You feel fewer than 10 movements in 2 hours. Your baby is not moving like he or she usually does. Date: ____________ Start time: ____________ Stop time: ____________ Movements:____________ Date: ____________ Start time: ____________ Stop time: ____________ Movements:____________ Date: ____________ Start time: ____________ Stop  time: ____________ Movements:____________ Date: ____________ Start time: ____________ Stop time: ____________ Movements:____________ Date: ____________ Start time: ____________ Stop time: ____________ Movements:____________ Date: ____________ Start time: ____________ Stop time: ____________ Movements:____________ Date: ____________ Start time: ____________ Stop time: ____________ Movements:____________ Date: ____________ Start time: ____________ Stop time: ____________ Movements:____________ Date: ____________ Start time: ____________ Stop time: ____________ Movements:____________ This information is not intended to replace advice given to you by your health care provider. Make sure you discuss any questions you have with your healthcare provider. Document Revised: 06/16/2019 Document Reviewed: 06/16/2019 Elsevier Patient Education  2022 [employer] Inc.  

## 2021-08-28 ENCOUNTER — Encounter: Payer: Self-pay | Admitting: Physical Therapy

## 2021-08-29 ENCOUNTER — Other Ambulatory Visit: Payer: Self-pay

## 2021-08-29 ENCOUNTER — Ambulatory Visit (INDEPENDENT_AMBULATORY_CARE_PROVIDER_SITE_OTHER): Payer: Medicaid Other

## 2021-08-29 DIAGNOSIS — Z3A33 33 weeks gestation of pregnancy: Secondary | ICD-10-CM | POA: Diagnosis not present

## 2021-08-29 DIAGNOSIS — O09899 Supervision of other high risk pregnancies, unspecified trimester: Secondary | ICD-10-CM | POA: Diagnosis not present

## 2021-08-29 MED ORDER — HYDROXYPROGESTERONE CAPROATE 250 MG/ML IM OIL
250.0000 mg | TOPICAL_OIL | Freq: Once | INTRAMUSCULAR | Status: AC
  Administered 2021-08-29: 250 mg via INTRAMUSCULAR

## 2021-08-29 NOTE — Progress Notes (Signed)
Pt present for Makena injection. Since her first dose has been getting the Auto injection pens for Makena. Print production planner was informed by pharmacy that had a shortest in the auto pen and sent in the vitals. Informed pt of the changes and that the medication was still the same. Pt has a hx of preterm labor is past pregnancies. Went over signs and symptoms of preterm labor. Pt denies noticing any signs or symptoms of preterm labor. Makena injection was administered and pt tolerated well.

## 2021-09-03 ENCOUNTER — Ambulatory Visit (INDEPENDENT_AMBULATORY_CARE_PROVIDER_SITE_OTHER): Payer: Medicaid Other | Admitting: Certified Nurse Midwife

## 2021-09-03 ENCOUNTER — Other Ambulatory Visit: Payer: Self-pay

## 2021-09-03 VITALS — BP 111/75 | HR 87 | Wt 173.5 lb

## 2021-09-03 DIAGNOSIS — Z3A33 33 weeks gestation of pregnancy: Secondary | ICD-10-CM

## 2021-09-03 DIAGNOSIS — Z8751 Personal history of pre-term labor: Secondary | ICD-10-CM

## 2021-09-03 DIAGNOSIS — Z3483 Encounter for supervision of other normal pregnancy, third trimester: Secondary | ICD-10-CM

## 2021-09-03 LAB — POCT URINALYSIS DIPSTICK OB
Bilirubin, UA: NEGATIVE
Blood, UA: NEGATIVE
Glucose, UA: NEGATIVE
Ketones, UA: NEGATIVE
Leukocytes, UA: NEGATIVE
Nitrite, UA: NEGATIVE
POC,PROTEIN,UA: NEGATIVE
Spec Grav, UA: 1.015 (ref 1.010–1.025)
Urobilinogen, UA: 0.2 E.U./dL
pH, UA: 7.5 (ref 5.0–8.0)

## 2021-09-03 MED ORDER — HYDROXYPROGESTERONE CAPROATE 250 MG/ML IM OIL
250.0000 mg | TOPICAL_OIL | Freq: Once | INTRAMUSCULAR | Status: AC
  Administered 2021-09-03: 250 mg via INTRAMUSCULAR

## 2021-09-03 NOTE — Progress Notes (Signed)
ROB doing well. Feeling good movement. Water birth class certificate received today and scanned to chart. Consent signed and scanned. PT has a friend that has tub she is using. She will get linner. Position statements ACNM/ACOG given. PT state she has recently been nursing her son because he became ill and to comfort him but he has now regressed and is nursing more frequently . She noted having contractions with nursing. Discussed oxytocin and breast feeding. Discussed increased risk of PTL due to her history. She verbalizes understanding and state she is going to try to stop. Follow up 2 wks for ROB.   [name], CNM

## 2021-09-03 NOTE — Patient Instructions (Signed)
Preterm Labor ?Pregnancy normally lasts 39-41 weeks. Preterm labor is when labor starts before you have been pregnant for 37 weeks. Babies who are born too early may have a higher risk for long-term problems like cerebral palsy or developmental delays. They may also have problems soon after birth, such as problems with blood sugar, body temperature, heart, and breathing. These problems may be very serious in babies who are born before 34 weeks of pregnancy. ?What are the causes? ?The cause of this condition is not known. ?What increases the risk? ?You are more likely to have preterm labor if: ?You have medical problems, now or in the past. ?You have problems now or in your past pregnancies. ?You have lifestyle problems. ?Medical history ?You have problems of the womb (uterus). ?You have an infection, including infections you get from sex. ?You have problems that do not go away, such as: ?Blood clots. ?High blood pressure. ?High blood sugar. ?You have low body weight or too much body weight. ?Present and past pregnancies ?You have had preterm labor before. ?You are pregnant with two babies or more. ?You have a condition in which the placenta covers your cervix. ?You waited less than 18 months between giving birth and becoming pregnant again. ?Your unborn baby has some problems. ?You have bleeding from your vagina. ?You became pregnant by a method called IVF. ?Lifestyle ?You smoke. ?You drink alcohol. ?You use drugs. ?You have stress. ?You have abuse in your home. ?You come in contact with chemicals that harm the body (pollutants). ?Other factors ?You are younger than 17 years or older than 35 years. ?What are the signs or symptoms? ?Symptoms of this condition include: ?Cramps. The cramps may feel like cramps from a period. You may also have watery poop (diarrhea). ?Pain in the belly (abdomen). ?Pain in the lower back. ?Regular contractions. It may feel like your belly is getting tighter. ?Pressure in the lower  belly. ?More fluid leaking from the vagina. The fluid may be watery or bloody. ?Water breaking. ?How is this treated? ?Treatment for this condition depends on your health, the health of your baby, and how old your pregnancy is. It may include: ?Taking medicines, such as: ?Hormone medicines. ?Medicines to stop contractions. ?Medicines to help mature the baby's lungs. ?Medicines to prevent your baby from getting cerebral palsy or other problems. ?Bed rest. If the labor happens before 34 weeks of pregnancy, you may need to stay in the hospital. ?Delivering the baby. ?Follow these instructions at home: ? ?Do not smoke or use any products that contain nicotine or tobacco. If you need help quitting, ask your doctor. ?Do not drink alcohol. ?Take over-the-counter and prescription medicines only as told by your doctor. ?Rest as told by your doctor. ?Return to your normal activities when your doctor says that it is safe. ?Keep all follow-up visits. ?How is this prevented? ?To have a healthy pregnancy: ?Do not use drugs. ?Do not use any medicines unless you ask your doctor if they are safe for you. ?Talk with your doctor before taking any herbal supplements. ?Make sure you gain enough weight. ?Watch for infection. If you think you might have an infection, get it checked right away. Symptoms of infection may include: ?Fever. ?Vaginal discharge that smells bad or is not normal. ?Pain or burning when you pee. ?Needing to pee urgently. ?Needing to pee often. ?Peeing small amounts often. ?Blood in your pee. ?Pee that smells bad or unusual. ?Where to find more information ?[place] Department of Health and Human Services   Office on Women's Health: www.womenshealth.gov ?The American College of Obstetricians and Gynecologists: www.acog.org ?Centers for Disease Control and Prevention: www.cdc.gov ?Contact a doctor if: ?You think you are going into preterm labor. ?You have symptoms of preterm labor. ?You have symptoms of infection. ?Get help  right away if: ?You are having painful contractions every 5 minutes or less. ?Your water breaks. ?Summary ?Preterm labor is labor that starts before you reach 37 weeks of pregnancy. ?Your baby may have problems if delivered early. ?You are more likely to have preterm labor if you have certain medical problems or problems with a pregnancy now or in the past. Some lifestyle factors can also increase the risk. ?Contact a doctor if you have symptoms of preterm labor. ?This information is not intended to replace advice given to you by your health care provider. Make sure you discuss any questions you have with your health care provider. ?Document Revised: 10/30/2020 Document Reviewed: 10/30/2020 ?Elsevier Patient Education ? 2022 Elsevier Inc. ? ?

## 2021-09-10 ENCOUNTER — Encounter: Payer: Self-pay | Admitting: Certified Nurse Midwife

## 2021-09-10 ENCOUNTER — Other Ambulatory Visit: Payer: Self-pay

## 2021-09-10 ENCOUNTER — Ambulatory Visit (INDEPENDENT_AMBULATORY_CARE_PROVIDER_SITE_OTHER): Payer: Medicaid Other | Admitting: Certified Nurse Midwife

## 2021-09-10 ENCOUNTER — Ambulatory Visit: Payer: Medicaid Other

## 2021-09-10 VITALS — BP 126/88 | HR 92 | Wt 175.3 lb

## 2021-09-10 DIAGNOSIS — Z3483 Encounter for supervision of other normal pregnancy, third trimester: Secondary | ICD-10-CM

## 2021-09-10 DIAGNOSIS — Z8751 Personal history of pre-term labor: Secondary | ICD-10-CM | POA: Diagnosis not present

## 2021-09-10 DIAGNOSIS — Z3A35 35 weeks gestation of pregnancy: Secondary | ICD-10-CM | POA: Diagnosis not present

## 2021-09-10 LAB — POCT URINALYSIS DIPSTICK OB
Bilirubin, UA: NEGATIVE
Blood, UA: NEGATIVE
Glucose, UA: NEGATIVE
Ketones, UA: NEGATIVE
Leukocytes, UA: NEGATIVE
Nitrite, UA: NEGATIVE
POC,PROTEIN,UA: NEGATIVE
Spec Grav, UA: 1.015 (ref 1.010–1.025)
Urobilinogen, UA: 0.2 E.U./dL
pH, UA: 6.5 (ref 5.0–8.0)

## 2021-09-10 MED ORDER — HYDROXYPROGESTERONE CAPROATE 250 MG/ML IM OIL
250.0000 mg | TOPICAL_OIL | Freq: Once | INTRAMUSCULAR | Status: AC
  Administered 2021-09-10: 250 mg via INTRAMUSCULAR

## 2021-09-10 NOTE — Progress Notes (Signed)
Not seen by provider, injection only visit.

## 2021-09-11 ENCOUNTER — Encounter: Payer: Self-pay | Admitting: Physical Therapy

## 2021-09-11 ENCOUNTER — Encounter: Payer: Self-pay | Admitting: Obstetrics and Gynecology

## 2021-09-11 ENCOUNTER — Observation Stay
Admission: EM | Admit: 2021-09-11 | Discharge: 2021-09-12 | Disposition: A | Discharge status: Home / Self Care | Payer: Medicaid Other | Attending: Obstetrics and Gynecology | Admitting: Obstetrics and Gynecology

## 2021-09-11 ENCOUNTER — Other Ambulatory Visit: Payer: Self-pay

## 2021-09-11 DIAGNOSIS — O26853 Spotting complicating pregnancy, third trimester: Principal | ICD-10-CM | POA: Insufficient documentation

## 2021-09-11 DIAGNOSIS — Z3490 Encounter for supervision of normal pregnancy, unspecified, unspecified trimester: Secondary | ICD-10-CM

## 2021-09-11 DIAGNOSIS — M545 Low back pain, unspecified: Secondary | ICD-10-CM | POA: Insufficient documentation

## 2021-09-11 DIAGNOSIS — Z3A35 35 weeks gestation of pregnancy: Secondary | ICD-10-CM | POA: Insufficient documentation

## 2021-09-12 DIAGNOSIS — Z3490 Encounter for supervision of normal pregnancy, unspecified, unspecified trimester: Secondary | ICD-10-CM

## 2021-09-12 DIAGNOSIS — O26853 Spotting complicating pregnancy, third trimester: Secondary | ICD-10-CM | POA: Diagnosis present

## 2021-09-12 DIAGNOSIS — Z3A35 35 weeks gestation of pregnancy: Secondary | ICD-10-CM | POA: Diagnosis not present

## 2021-09-12 DIAGNOSIS — M549 Dorsalgia, unspecified: Secondary | ICD-10-CM | POA: Diagnosis not present

## 2021-09-12 DIAGNOSIS — O26893 Other specified pregnancy related conditions, third trimester: Secondary | ICD-10-CM | POA: Diagnosis not present

## 2021-09-12 DIAGNOSIS — M545 Low back pain, unspecified: Secondary | ICD-10-CM | POA: Diagnosis not present

## 2021-09-12 NOTE — OB Triage Note (Signed)
Patient discharged home in stable condition by provider. RN provided discharge instructions to patient including labor precautions and when to return to hospital. Patient verbalized understanding. All questions answered at this time.

## 2021-09-12 NOTE — Telephone Encounter (Signed)
Tried to call patient to see how she was doing. No answer and I did not leave a vm. I sent mychart message for her to reach back out if she has any concerns.

## 2021-09-12 NOTE — OB Triage Note (Signed)
Pt is a G3P2 at [redacted]w[redacted]d presenting with vaginal spotting and lower back pain. Pt states she went to the bathroom at 1940 and noted pink and bright red spotting on tissue. Pt got in shower to help back pain without relief. Pt used toilet again at 2030 and noted vaginal pressure. She took a picture which did show a protrusion of vaginal tissue. Pt reports +FM and denies LOF. Midwife called, report given and is coming to evaluate in person.

## 2021-09-12 NOTE — Discharge Instructions (Signed)

## 2021-09-12 NOTE — OB Triage Note (Signed)
    L&D OB Triage Note  SUBJECTIVE Bonnie Graham is a 25 y.o. [id number] female at [redacted]w[redacted]d, EDD Estimated Date of Delivery: 10/15/21 who presented to triage with complaints of spotting , back pain , pressure.. She denies contractions, lof , vaginal bleeding, and feels good fetal movement. Pt notes after having bowel movement she had protrusion of vaginal tissue from vagina.  OB History  Gravida Para Term Preterm AB Living  3 2 0 2 0 2  SAB IAB Ectopic Multiple Live Births  0 0 0 0 2    # Outcome Date GA Lbr Len/2nd Weight Sex Delivery Anes PTL Lv  3 Current           2 Preterm 11/13/19 [redacted]w[redacted]d  2318 g M Vag-Spont  Y LIV     Name: Turner Daniels  1 Preterm 05/08/18 [redacted]w[redacted]d  1415 g M Vag-Spont EPI N LIV     Complications: Premature delivery     Name: Knox Royalty    Medications Prior to Admission  Medication Sig Dispense Refill Last Dose   HYDROXYprogesterone caproate (MAKENA) autoinjector Inject 275 mg into the skin every 7 (seven) days.   09/10/2021   Magnesium Oxide 400 MG CAPS Take 1 capsule (400 mg total) by mouth daily. 30 capsule 4 Past Week   Prenatal Vit-Fe Sulfate-FA-DHA (PRENATAL VITAMIN/MIN +DHA) 27-0.8-200 MG CAPS Take 1 tablet by mouth daily. 30 capsule 11 09/11/2021     OBJECTIVE  Nursing Evaluation:   BP 126/77 (BP Location: Right Arm)   Pulse 99   Temp 97.9 F (36.6 C) (Oral)   Resp 16   Ht 5\' 3"  (1.6 m)   Wt 79.4 kg   LMP 01/08/2021 (Exact Date)   BMI 31.00 kg/m    Findings:        Irregular contractions      NST was performed and has been reviewed by me.  NST INTERPRETATION: Category I  Mode: External Baseline Rate (A): 120 bpm Variability: Moderate Accelerations: 15 x 15 Decelerations: None     Contraction Frequency (min): UI  ASSESSMENT Impression:  1.  Pregnancy:  03/10/2021 at [redacted]w[redacted]d , EDD Estimated Date of Delivery: 10/15/21 2.  Reassuring fetal and maternal status 3.  No tissue seen on exam , explained to pt that can happen with bearing down. Reassurance  given 4. No cervical change in 2 hrs.   PLAN 1. Current condition and above findings reviewed.  Reassuring fetal and maternal condition. 2. Discharge home with standard labor precautions given to return to L&D or call the office for problems. 3. Continue routine prenatal care.  14/6/22, CNM

## 2021-09-17 ENCOUNTER — Inpatient Hospital Stay
Admission: EM | Admit: 2021-09-17 | Discharge: 2021-09-18 | DRG: 807 | Disposition: A | Discharge status: Home / Self Care | Payer: Medicaid Other | Attending: Certified Nurse Midwife | Admitting: Certified Nurse Midwife

## 2021-09-17 ENCOUNTER — Other Ambulatory Visit: Payer: Self-pay

## 2021-09-17 ENCOUNTER — Encounter: Payer: Medicaid Other | Admitting: Certified Nurse Midwife

## 2021-09-17 ENCOUNTER — Encounter: Payer: Self-pay | Admitting: Obstetrics and Gynecology

## 2021-09-17 DIAGNOSIS — Z20822 Contact with and (suspected) exposure to covid-19: Secondary | ICD-10-CM | POA: Diagnosis present

## 2021-09-17 DIAGNOSIS — Z3483 Encounter for supervision of other normal pregnancy, third trimester: Secondary | ICD-10-CM

## 2021-09-17 DIAGNOSIS — Z3A36 36 weeks gestation of pregnancy: Secondary | ICD-10-CM

## 2021-09-17 DIAGNOSIS — O26893 Other specified pregnancy related conditions, third trimester: Secondary | ICD-10-CM | POA: Diagnosis present

## 2021-09-17 LAB — TYPE AND SCREEN
ABO/RH(D): A POS
Antibody Screen: NEGATIVE

## 2021-09-17 LAB — CBC
HCT: 33.5 % — ABNORMAL LOW (ref 36.0–46.0)
Hemoglobin: 10.9 g/dL — ABNORMAL LOW (ref 12.0–15.0)
MCH: 28 pg (ref 26.0–34.0)
MCHC: 32.5 g/dL (ref 30.0–36.0)
MCV: 86.1 fL (ref 80.0–100.0)
Platelets: 329 10*3/uL (ref 150–400)
RBC: 3.89 MIL/uL (ref 3.87–5.11)
RDW: 14.7 % (ref 11.5–15.5)
WBC: 13.5 10*3/uL — ABNORMAL HIGH (ref 4.0–10.5)
nRBC: 0 % (ref 0.0–0.2)

## 2021-09-17 LAB — RESP PANEL BY RT-PCR (FLU A&B, COVID) ARPGX2
Influenza A by PCR: NEGATIVE
Influenza B by PCR: NEGATIVE
SARS Coronavirus 2 by RT PCR: POSITIVE — AB

## 2021-09-17 LAB — GROUP B STREP BY PCR: Group B strep by PCR: NEGATIVE

## 2021-09-17 MED ORDER — IBUPROFEN 600 MG PO TABS
ORAL_TABLET | ORAL | Status: AC
  Filled 2021-09-17: qty 1

## 2021-09-17 MED ORDER — OXYTOCIN-SODIUM CHLORIDE 30-0.9 UT/500ML-% IV SOLN: 2.5000 [IU]/h | INTRAVENOUS | Status: DC

## 2021-09-17 MED ORDER — ACETAMINOPHEN 325 MG PO TABS
650.0000 mg | ORAL_TABLET | ORAL | Status: DC | PRN
  Administered 2021-09-17: 650 mg via ORAL
  Filled 2021-09-17: qty 2

## 2021-09-17 MED ORDER — IBUPROFEN 600 MG PO TABS
600.0000 mg | ORAL_TABLET | Freq: Four times a day (QID) | ORAL | Status: DC
  Administered 2021-09-17 – 2021-09-18 (×5): 600 mg via ORAL
  Filled 2021-09-17 (×5): qty 1

## 2021-09-17 MED ORDER — BENZOCAINE-MENTHOL 20-0.5 % EX AERO: 1.0000 "application " | INHALATION_SPRAY | CUTANEOUS | Status: DC | PRN

## 2021-09-17 MED ORDER — ONDANSETRON HCL 4 MG/2ML IJ SOLN: 4.0000 mg | INTRAMUSCULAR | Status: DC | PRN

## 2021-09-17 MED ORDER — WITCH HAZEL-GLYCERIN EX PADS: 1.0000 "application " | MEDICATED_PAD | CUTANEOUS | Status: DC | PRN

## 2021-09-17 MED ORDER — SIMETHICONE 80 MG PO CHEW: 80.0000 mg | CHEWABLE_TABLET | ORAL | Status: DC | PRN

## 2021-09-17 MED ORDER — ONDANSETRON HCL 4 MG PO TABS: 4.0000 mg | ORAL_TABLET | ORAL | Status: DC | PRN

## 2021-09-17 MED ORDER — OXYTOCIN-SODIUM CHLORIDE 30-0.9 UT/500ML-% IV SOLN
INTRAVENOUS | Status: AC
  Filled 2021-09-17: qty 500

## 2021-09-17 MED ORDER — LACTATED RINGERS IV SOLN: INTRAVENOUS | Status: DC

## 2021-09-17 MED ORDER — SENNOSIDES-DOCUSATE SODIUM 8.6-50 MG PO TABS
2.0000 | ORAL_TABLET | ORAL | Status: DC
  Filled 2021-09-17 (×2): qty 2

## 2021-09-17 MED ORDER — OXYCODONE-ACETAMINOPHEN 5-325 MG PO TABS: 1.0000 | ORAL_TABLET | ORAL | Status: DC | PRN

## 2021-09-17 MED ORDER — METHYLERGONOVINE MALEATE 0.2 MG/ML IJ SOLN: 0.2000 mg | INTRAMUSCULAR | Status: DC | PRN

## 2021-09-17 MED ORDER — PRENATAL MULTIVITAMIN CH
1.0000 | ORAL_TABLET | Freq: Every day | ORAL | Status: DC
  Administered 2021-09-17 – 2021-09-18 (×2): 1 via ORAL
  Filled 2021-09-17 (×2): qty 1

## 2021-09-17 MED ORDER — OXYTOCIN 10 UNIT/ML IJ SOLN
INTRAMUSCULAR | Status: AC
  Filled 2021-09-17: qty 2

## 2021-09-17 MED ORDER — LACTATED RINGERS IV SOLN: 500.0000 mL | INTRAVENOUS | Status: DC | PRN

## 2021-09-17 MED ORDER — OXYCODONE-ACETAMINOPHEN 5-325 MG PO TABS: 2.0000 | ORAL_TABLET | ORAL | Status: DC | PRN

## 2021-09-17 MED ORDER — METHYLERGONOVINE MALEATE 0.2 MG PO TABS: 0.2000 mg | ORAL_TABLET | ORAL | Status: DC | PRN

## 2021-09-17 MED ORDER — BUTORPHANOL TARTRATE 1 MG/ML IJ SOLN: 1.0000 mg | INTRAMUSCULAR | Status: DC | PRN

## 2021-09-17 MED ORDER — COCONUT OIL OIL: 1.0000 "application " | TOPICAL_OIL | Status: DC | PRN

## 2021-09-17 MED ORDER — MISOPROSTOL 200 MCG PO TABS
ORAL_TABLET | ORAL | Status: AC
  Filled 2021-09-17: qty 4

## 2021-09-17 MED ORDER — OXYTOCIN BOLUS FROM INFUSION
333.0000 mL | Freq: Once | INTRAVENOUS | Status: AC
  Administered 2021-09-17: 333 mL via INTRAVENOUS

## 2021-09-17 MED ORDER — ACETAMINOPHEN 325 MG PO TABS: 650.0000 mg | ORAL_TABLET | ORAL | Status: DC | PRN

## 2021-09-17 MED ORDER — LIDOCAINE HCL (PF) 1 % IJ SOLN: 30.0000 mL | INTRAMUSCULAR | Status: DC | PRN

## 2021-09-17 MED ORDER — AMMONIA AROMATIC IN INHA
RESPIRATORY_TRACT | Status: AC
  Filled 2021-09-17: qty 10

## 2021-09-17 MED ORDER — LIDOCAINE HCL (PF) 1 % IJ SOLN
INTRAMUSCULAR | Status: AC
  Filled 2021-09-17: qty 30

## 2021-09-17 MED ORDER — DIBUCAINE (PERIANAL) 1 % EX OINT: 1.0000 "application " | TOPICAL_OINTMENT | CUTANEOUS | Status: DC | PRN

## 2021-09-17 MED ORDER — DOCUSATE SODIUM 100 MG PO CAPS
100.0000 mg | ORAL_CAPSULE | Freq: Two times a day (BID) | ORAL | Status: DC
  Administered 2021-09-18: 100 mg via ORAL
  Filled 2021-09-17: qty 1

## 2021-09-17 MED ORDER — FERROUS SULFATE 325 (65 FE) MG PO TABS
325.0000 mg | ORAL_TABLET | Freq: Every day | ORAL | Status: DC
  Administered 2021-09-17 – 2021-09-18 (×2): 325 mg via ORAL
  Filled 2021-09-17 (×2): qty 1

## 2021-09-17 NOTE — H&P (Signed)
History and Physical   HPI  Thomas Rhude is a 25 y.o. [id number] at [redacted]w[redacted]d Estimated Date of Delivery: 10/15/21 who is being admitted for labor management   OB History  OB History  Gravida Para Term Preterm AB Living  3 2 0 2 0 2  SAB IAB Ectopic Multiple Live Births  0 0 0 0 2    # Outcome Date GA Lbr Len/2nd Weight Sex Delivery Anes PTL Lv  3 Current           2 Preterm 11/13/19 [redacted]w[redacted]d  2318 g M Vag-Spont  Y LIV     Name: Turner Daniels  1 Preterm 05/08/18 [redacted]w[redacted]d  1415 g M Vag-Spont EPI N LIV     Complications: Premature delivery     Name: [name]    PROBLEM LIST  Pregnancy complications or risks: Patient Active Problem List   Diagnosis Date Noted   Labor and delivery, indication for care 09/17/2021   Pregnancy 09/12/2021   Labor and delivery indication for care or intervention 11/04/2019   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   History of preterm delivery 06/17/2018   History of heavy periods 01/02/2017   Dysmenorrhea 01/02/2017   Family history of endometriosis in first degree relative 01/02/2017   Family history of first-degree relative with cardiomyopathy 04/16/2016    Prenatal labs and studies: ABO, Rh: A/Positive/-- (05/13 1531) Antibody: Negative (05/13 1531) Rubella: 1.07 (05/13 1531) RPR: Non Reactive (09/14 1016)  HBsAg: Negative (05/13 1531)  HIV: Non Reactive (05/13 1531)  GBS:    Past Medical History:  Diagnosis Date   Heavy periods    Painful menstrual periods      Past Surgical History:  Procedure Laterality Date   NO PAST SURGERIES       Medications    Current Discharge Medication List     CONTINUE these medications which have NOT CHANGED   Details  HYDROXYprogesterone caproate (MAKENA) autoinjector Inject 275 mg into the skin every 7 (seven) days.    Magnesium Oxide 400 MG CAPS Take 1 capsule (400 mg total) by mouth daily. Qty: 30 capsule, Refills: 4    Prenatal Vit-Fe Sulfate-FA-DHA (PRENATAL VITAMIN/MIN +DHA) 27-0.8-200 MG CAPS  Take 1 tablet by mouth daily. Qty: 30 capsule, Refills: 11         Allergies  Patient has no known allergies.  Review of Systems  Constitutional: negative Eyes: negative Ears, nose, mouth, throat, and face: negative Respiratory: negative Cardiovascular: negative Gastrointestinal: negative Genitourinary:negative Integument/breast: negative Hematologic/lymphatic: negative Musculoskeletal:negative Neurological: negative Behavioral/Psych: negative Endocrine: negative Allergic/Immunologic: negative  Physical Exam  LMP 01/08/2021 (Exact Date)   Lungs:  CTA B Cardio: RRR without M/R/G Abd: Soft, gravid, NT Presentation: cephalic EXT: No C/C/ 1+ Edema DTRs: 2+ B CERVIX: Dilation: 7 Effacement (%): 100 Station: 0  See Prenatal records for more detailed PE.     FHR:  Baseline: 130 bpm, Variability: Good {> 6 bpm), Accelerations: Reactive, and Decelerations: Absent  Toco: Uterine Contractions: Frequency: Every 2-4 minutes and Intensity: moderate  Test Results  No results found for this or any previous visit (from the past 24 hour(s)). Other:   Assessment   [id number] at [redacted]w[redacted]d Estimated Date of Delivery: 10/15/21  The fetus is reassuring.   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 Admit to L&D :   2. EFM:-- Category 1 3. Stadol or Epidural if desired.   4. Admission labs  5. Anticipate NSVD  [name], CNM  09/17/2021 1:10 AM

## 2021-09-17 NOTE — Progress Notes (Signed)
LABOR NOTE   Bonnie Graham 25 y.o.GP@ at [redacted]w[redacted]d  SUBJECTIVE:  Pt agreeable to AROM to help labor progress Analgesia: Labor support without medications  OBJECTIVE:  LMP 01/08/2021 (Exact Date)  No intake/output data recorded.  She has shown cervical change. CERVIX: 7-8 cm:  100%:   -1:   mid position:   soft SVE:   Dilation: 7.5 Effacement (%): 100 Station: 0 Exam by:: A. Janee Morn, CNM CONTRACTIONS: regular, every 2-4 minutes FHR: Fetal heart tracing reviewed. Baseline: 150 bpm, Variability: Good {> 6 bpm), Accelerations: Reactive, and Decelerations: Absent Category I    Labs: Lab Results  Component Value Date   WBC 11.8 (H) 07/24/2021   HGB 11.0 (L) 07/24/2021   HCT 33.6 (L) 07/24/2021   MCV 88 07/24/2021   PLT 312 07/24/2021    ASSESSMENT: 1) Labor curve reviewed.       Progress: Active phase labor.     Membranes: ruptured, clear fluid           Active Problems:   Labor and delivery, indication for care   PLAN: continue present management   [name], CNM  09/17/2021 1:51 AM

## 2021-09-17 NOTE — OB Triage Note (Signed)
Patient here for ctx every 5 mins for last several hours, denies LOF or bleeding. Positive fetal movement continue to monitor

## 2021-09-18 LAB — CBC
HCT: 31.1 % — ABNORMAL LOW (ref 36.0–46.0)
Hemoglobin: 9.9 g/dL — ABNORMAL LOW (ref 12.0–15.0)
MCH: 28 pg (ref 26.0–34.0)
MCHC: 31.8 g/dL (ref 30.0–36.0)
MCV: 87.9 fL (ref 80.0–100.0)
Platelets: 305 10*3/uL (ref 150–400)
RBC: 3.54 MIL/uL — ABNORMAL LOW (ref 3.87–5.11)
RDW: 14.9 % (ref 11.5–15.5)
WBC: 14.9 10*3/uL — ABNORMAL HIGH (ref 4.0–10.5)
nRBC: 0 % (ref 0.0–0.2)

## 2021-09-18 MED ORDER — IBUPROFEN 600 MG PO TABS: 600.0000 mg | ORAL_TABLET | Freq: Four times a day (QID) | ORAL | 0 refills | Status: DC

## 2021-09-18 NOTE — Progress Notes (Signed)
Pt discharged with infant.  Discharge instructions, prescriptions and follow up appointment given to and reviewed with pt. Pt verbalized understanding. Escorted out by staff. 

## 2021-09-18 NOTE — Discharge Summary (Signed)
Patient Name: Bonnie Graham DOB: [birth year] MRN: [id number]                            Discharge Summary  Date of Admission: 09/17/2021 Date of Discharge: 09/18/2021 Delivering Provider: Philip Aspen   Admitting Diagnosis: Labor and delivery, indication for care [O75.9] at [redacted]w[redacted]d Secondary diagnosis:  Active Problems:   Labor and delivery, indication for care   Mode of Delivery: normal spontaneous vaginal delivery              Discharge diagnosis: Preterm Pregnancy Delivered      Intrapartum Procedures: Atificial rupture of membranes   Post partum procedures:  none  Complications: none                     Discharge Day SOAP Note:  Progress Note - Vaginal Delivery  Bonnie Graham is a 25 y.o. [id number] now PP day 1 s/p Vaginal, Spontaneous . Delivery was complicated by preterm labor   Subjective  The patient has the following complaints: has no unusual complaints  Pain is controlled with current medications.   Patient is urinating without difficulty.  She is ambulating well.     Objective  Vital signs: BP 116/73 (BP Location: Right Arm)   Pulse 98   Temp 98.1 F (36.7 C) (Axillary)   Resp 18   LMP 01/08/2021 (Exact Date)   SpO2 98%   Physical Exam: Gen: NAD Fundus Fundal Tone: Firm  Lochia Amount: Small        Data Review Labs: Lab Results  Component Value Date   WBC 14.9 (H) 09/18/2021   HGB 9.9 (L) 09/18/2021   HCT 31.1 (L) 09/18/2021   MCV 87.9 09/18/2021   PLT 305 09/18/2021   CBC Latest Ref Rng & Units 09/18/2021 09/17/2021 07/24/2021  WBC 4.0 - 10.5 K/uL 14.9(H) 13.5(H) 11.8(H)  Hemoglobin 12.0 - 15.0 g/dL 9.9(L) 10.9(L) 11.0(L)  Hematocrit 36.0 - 46.0 % 31.1(L) 33.5(L) 33.6(L)  Platelets 150 - 400 K/uL 305 329 312   A POS  Edinburgh Score: Edinburgh Postnatal Depression Scale Screening Tool 09/17/2021  I have been able to laugh and see the funny side of things. 0  I have looked forward with enjoyment to things. 0  I have blamed  myself unnecessarily when things went wrong. 1  I have been anxious or worried for no good reason. 0  I have felt scared or panicky for no good reason. 0  Things have been getting on top of me. 2  I have been so unhappy that I have had difficulty sleeping. 0  I have felt sad or miserable. 0  I have been so unhappy that I have been crying. 0  The thought of harming myself has occurred to me. 0  Edinburgh Postnatal Depression Scale Total 3    Assessment/Plan  Active Problems:   Labor and delivery, indication for care    Plan for discharge today.  Discharge Instructions: Per After Visit Summary. Activity: Advance as tolerated. Pelvic rest for 6 weeks.  Also refer to After Visit Summary Diet: Regular Medications: Allergies as of 09/18/2021   No Known Allergies      Medication List     STOP taking these medications    Makena autoinjector Generic drug: HYDROXYprogesterone caproate       TAKE these medications    ibuprofen 600 MG tablet Commonly known as: ADVIL Take 1 tablet (  600 mg total) by mouth every 6 (six) hours.   Magnesium Oxide 400 MG Caps Take 1 capsule (400 mg total) by mouth daily.   Prenatal Vitamin/Min +DHA 27-0.8-200 MG Caps Take 1 tablet by mouth daily.       Outpatient follow up: 2 wk tele visit, 6 wk ppv in office with Pattricia Boss   Postpartum contraception: Will discuss at first office visit post-partum  Discharged Condition: good  Discharged to: home  Newborn Data: Disposition:home with mother  Apgars: APGAR (1 MIN): 7   APGAR (5 MINS): 9   APGAR (10 MINS):    Baby Feeding: Breast    [name], CNM  09/18/2021 5:01 PM

## 2021-09-18 NOTE — Final Progress Note (Signed)
Discharge Day SOAP Note:  Progress Note - Vaginal Delivery  Bonnie Graham is a 25 y.o. [phone] now PP day 1 s/p Vaginal, Spontaneous . Delivery was complicated by preterm labor   Subjective  The patient has the following complaints: has no unusual complaints  Pain is controlled with current medications.   Patient is urinating without difficulty.  She is ambulating well.     Objective  Vital signs: BP 116/73 (BP Location: Right Arm)   Pulse 98   Temp 98.1 F (36.7 C) (Axillary)   Resp 18   LMP 01/08/2021 (Exact Date)   SpO2 98%   Physical Exam: Gen: NAD Fundus Fundal Tone: Firm  Lochia Amount: Small        Data Review Labs: Lab Results  Component Value Date   WBC 14.9 (H) 09/18/2021   HGB 9.9 (L) 09/18/2021   HCT 31.1 (L) 09/18/2021   MCV 87.9 09/18/2021   PLT 305 09/18/2021   CBC Latest Ref Rng & Units 09/18/2021 09/17/2021 07/24/2021  WBC 4.0 - 10.5 K/uL 14.9(H) 13.5(H) 11.8(H)  Hemoglobin 12.0 - 15.0 g/dL 3.4(V) 10.9(L) 11.0(L)  Hematocrit 36.0 - 46.0 % 31.1(L) 33.5(L) 33.6(L)  Platelets 150 - 400 K/uL 305 329 312   A POS  Edinburgh Score: Edinburgh Postnatal Depression Scale Screening Tool 09/17/2021  I have been able to laugh and see the funny side of things. 0  I have looked forward with enjoyment to things. 0  I have blamed myself unnecessarily when things went wrong. 1  I have been anxious or worried for no good reason. 0  I have felt scared or panicky for no good reason. 0  Things have been getting on top of me. 2  I have been so unhappy that I have had difficulty sleeping. 0  I have felt sad or miserable. 0  I have been so unhappy that I have been crying. 0  The thought of harming myself has occurred to me. 0  Edinburgh Postnatal Depression Scale Total 3    Assessment/Plan  Active Problems:   Labor and delivery, indication for care    Plan for discharge today.  Discharge Instructions: Per After Visit Summary. Activity: Advance as  tolerated. Pelvic rest for 6 weeks.  Also refer to After Visit Summary Diet: Regular Medications: Allergies as of 09/18/2021   No Known Allergies      Medication List     STOP taking these medications    Makena autoinjector Generic drug: HYDROXYprogesterone caproate       TAKE these medications    ibuprofen 600 MG tablet Commonly known as: ADVIL Take 1 tablet (600 mg total) by mouth every 6 (six) hours.   Magnesium Oxide 400 MG Caps Take 1 capsule (400 mg total) by mouth daily.   Prenatal Vitamin/Min +DHA 27-0.8-200 MG Caps Take 1 tablet by mouth daily.       Outpatient follow up: 2 wk tele visit, 6 wk ppv in office with Pattricia Boss   Postpartum contraception: Will discuss at first office visit post-partum  Discharged Condition: good  Discharged to: home  Newborn Data: Disposition:home with mother  Apgars: APGAR (1 MIN): 7   APGAR (5 MINS): 9   APGAR (10 MINS):    Baby Feeding: Breast    [name], CNM  09/18/2021 5:01 PM

## 2021-09-24 ENCOUNTER — Encounter: Payer: Medicaid Other | Admitting: Certified Nurse Midwife

## 2021-09-25 ENCOUNTER — Encounter: Payer: Self-pay | Admitting: Physical Therapy

## 2021-10-01 ENCOUNTER — Encounter: Payer: Medicaid Other | Admitting: Certified Nurse Midwife

## 2021-10-01 ENCOUNTER — Telehealth (INDEPENDENT_AMBULATORY_CARE_PROVIDER_SITE_OTHER): Payer: Medicaid Other | Admitting: Certified Nurse Midwife

## 2021-10-01 DIAGNOSIS — Z1331 Encounter for screening for depression: Secondary | ICD-10-CM

## 2021-10-01 DIAGNOSIS — Z1332 Encounter for screening for maternal depression: Secondary | ICD-10-CM

## 2021-10-01 NOTE — Progress Notes (Signed)
Virtual Visit via Video Note  I connected with Bonnie Graham on 10/01/21 at  9:45 AM EST by a video enabled telemedicine application and verified that I am speaking with the correct person using two identifiers.  Location: Patient: at home Provider: at office   I discussed the limitations of evaluation and management by telemedicine and the availability of in person appointments. The patient expressed understanding and agreed to proceed.  History of Present Illness: 25 year old 2 wk postpartum SVD 09/17/21 @ 36 wks  Observations/Objective: Doing well, nursing without difficulty. Pain has resolved, bleeding is decreasing. She notes continued vaginal pressure. Discussed pelvic floor pt @ 6 wks pp. Instructed to start Kegel at 4 wks pp.   Assessment and Plan: Edps-5 mild   Follow Up Instructions: As scheduled in office 4 wks.    I discussed the assessment and treatment plan with the patient. The patient was provided an opportunity to ask questions and all were answered. The patient agreed with the plan and demonstrated an understanding of the instructions.   The patient was advised to call back or seek an in-person evaluation if the symptoms worsen or if the condition fails to improve as anticipated.  I provided 7 minutes of non-face-to-face time during this encounter.   [name], CNM

## 2021-10-08 ENCOUNTER — Encounter: Payer: Medicaid Other | Admitting: Certified Nurse Midwife

## 2021-10-09 ENCOUNTER — Encounter: Payer: Self-pay | Admitting: Physical Therapy

## 2021-10-30 ENCOUNTER — Other Ambulatory Visit: Payer: Self-pay

## 2021-10-30 ENCOUNTER — Ambulatory Visit (INDEPENDENT_AMBULATORY_CARE_PROVIDER_SITE_OTHER): Payer: Medicaid Other | Admitting: Certified Nurse Midwife

## 2021-10-30 ENCOUNTER — Encounter: Payer: Self-pay | Admitting: Certified Nurse Midwife

## 2021-10-30 DIAGNOSIS — R102 Pelvic and perineal pain: Secondary | ICD-10-CM

## 2021-10-30 NOTE — Patient Instructions (Signed)
Preventive Care 21-23 Years Old, Female °Preventive care refers to lifestyle choices and visits with your health care provider that can promote health and wellness. Preventive care visits are also called wellness exams. °What can I expect for my preventive care visit? °Counseling °During your preventive care visit, your health care provider may ask about your: °Medical history, including: °Past medical problems. °Family medical history. °Pregnancy history. °Current health, including: °Menstrual cycle. °Method of birth control. °Emotional well-being. °Home life and relationship well-being. °Sexual activity and sexual health. °Lifestyle, including: °Alcohol, nicotine or tobacco, and drug use. °Access to firearms. °Diet, exercise, and sleep habits. °Work and work environment. °Sunscreen use. °Safety issues such as seatbelt and bike helmet use. °Physical exam °Your health care provider may check your: °Height and weight. These may be used to calculate your BMI (body mass index). BMI is a measurement that tells if you are at a healthy weight. °Waist circumference. This measures the distance around your waistline. This measurement also tells if you are at a healthy weight and may help predict your risk of certain diseases, such as type 2 diabetes and high blood pressure. °Heart rate and blood pressure. °Body temperature. °Skin for abnormal spots. °What immunizations do I need? °Vaccines are usually given at various ages, according to a schedule. Your health care provider will recommend vaccines for you based on your age, medical history, and lifestyle or other factors, such as travel or where you work. °What tests do I need? °Screening °Your health care provider may recommend screening tests for certain conditions. This may include: °Pelvic exam and Pap test. °Lipid and cholesterol levels. °Diabetes screening. This is done by checking your blood sugar (glucose) after you have not eaten for a while (fasting). °Hepatitis B  test. °Hepatitis C test. °HIV (human immunodeficiency virus) test. °STI (sexually transmitted infection) testing, if you are at risk. °BRCA-related cancer screening. This may be done if you have a family history of breast, ovarian, tubal, or peritoneal cancers. °Talk with your health care provider about your test results, treatment options, and if necessary, the need for more tests. °Follow these instructions at home: °Eating and drinking ° °Eat a healthy diet that includes fresh fruits and vegetables, whole grains, lean protein, and low-fat dairy products. °Take vitamin and mineral supplements as recommended by your health care provider. °Do not drink alcohol if: °Your health care provider tells you not to drink. °You are pregnant, may be pregnant, or are planning to become pregnant. °If you drink alcohol: °Limit how much you have to 0-1 drink a day. °Know how much alcohol is in your drink. In the [place] one drink equals one 12 oz bottle of beer (355 mL), one 5 oz glass of wine (148 mL), or one 1½ oz glass of hard liquor (44 mL). °Lifestyle °Brush your teeth every morning and night with fluoride toothpaste. Floss one time each day. °Exercise for at least 30 minutes 5 or more days each week. °Do not use any products that contain nicotine or tobacco. These products include cigarettes, chewing tobacco, and vaping devices, such as e-cigarettes. If you need help quitting, ask your health care provider. °Do not use drugs. °If you are sexually active, practice safe sex. Use a condom or other form of protection to prevent STIs. °If you do not wish to become pregnant, use a form of birth control. If you plan to become pregnant, see your health care provider for a prepregnancy visit. °Find healthy ways to manage stress, such as: °Meditation, yoga,   or listening to music. °Journaling. °Talking to a trusted person. °Spending time with friends and family. °Minimize exposure to UV radiation to reduce your risk of skin  cancer. °Safety °Always wear your seat belt while driving or riding in a vehicle. °Do not drive: °If you have been drinking alcohol. Do not ride with someone who has been drinking. °If you have been using any mind-altering substances or drugs. °While texting. °When you are tired or distracted. °Wear a helmet and other protective equipment during sports activities. °If you have firearms in your house, make sure you follow all gun safety procedures. °Seek help if you have been physically or sexually abused. °What's next? °Go to your health care provider once a year for an annual wellness visit. °Ask your health care provider how often you should have your eyes and teeth checked. °Stay up to date on all vaccines. °This information is not intended to replace advice given to you by your health care provider. Make sure you discuss any questions you have with your health care provider. °Document Revised: 04/24/2021 Document Reviewed: 04/24/2021 °Elsevier Patient Education © 2022 [employer] ° °

## 2021-10-30 NOTE — Progress Notes (Signed)
Subjective:    Bonnie Graham is a 25 y.o. [phone] Caucasian female who presents for a postpartum visit. She is 6 weeks postpartum following a spontaneous vaginal delivery at 36 gestational weeks. Anesthesia: none. I have fully reviewed the prenatal and intrapartum course. Postpartum course has been normal. Baby's course has been normal. Baby is feeding by breast. Bleeding no bleeding. Bowel function is normal. Bladder function is normal. She is feeling some pelvic pian and pressure. Request  PT. Patient is not sexually active. Contraception method is condoms. Postpartum depression screening: negative. Score 5.  Last pap 04/02/21 and was normal.  The following portions of the patient's history were reviewed and updated as appropriate: allergies, current medications, past medical history, past surgical history and problem list.  Review of Systems Pertinent items are noted in HPI.   Vitals:   10/30/21 1049  BP: 113/76  Pulse: 76  Weight: 161 lb 11.2 oz (73.3 kg)  Height: 5\' 3"  (1.6 m)   No LMP recorded (lmp unknown).  Objective:   General:  alert, cooperative and no distress   Breasts:  deferred, no complaints  Lungs: clear to auscultation bilaterally  Heart:  regular rate and rhythm  Abdomen: soft, nontender   Vulva: normal  Vagina: normal vagina  Cervix:  closed  Corpus: Well-involuted  Adnexa:  Non-palpable  Rectal Exam: no hemorrhoids        Assessment:   Postpartum exam 6 wks s/p SVD Breast feeding Depression screening Contraception counseling   Plan:  : condoms, orders placed for pelvic floor PT Follow up in: 6 month for annual or earlier if needed  , CNM

## 2021-11-05 ENCOUNTER — Other Ambulatory Visit: Payer: Self-pay

## 2021-11-05 ENCOUNTER — Ambulatory Visit: Payer: Medicaid Other | Attending: Certified Nurse Midwife | Admitting: Physical Therapy

## 2021-11-05 ENCOUNTER — Encounter: Payer: Self-pay | Admitting: Physical Therapy

## 2021-11-05 DIAGNOSIS — R278 Other lack of coordination: Secondary | ICD-10-CM | POA: Insufficient documentation

## 2021-11-05 DIAGNOSIS — R102 Pelvic and perineal pain: Secondary | ICD-10-CM | POA: Diagnosis present

## 2021-11-05 DIAGNOSIS — M6281 Muscle weakness (generalized): Secondary | ICD-10-CM | POA: Insufficient documentation

## 2021-11-05 NOTE — Patient Instructions (Signed)
Access Code: [email or web address] URL: [email or web address] Date: 11/05/2021 Prepared by: Sheria Lang  Exercises Supine Diaphragmatic Breathing - 10 reps Abdominal Correction with Gentle Compression - 10 reps Beginner Bridge - 1 x daily - 7 x weekly - 2 sets - 15 reps Seated Diastasis Recti Correction - 10 reps

## 2021-11-05 NOTE — Therapy (Signed)
[facility] [address] [place], [place], [address] Phone: [phone]   Fax:  [phone]  Physical Therapy Evaluation  Patient Details  Name: Bonnie Graham MRN: [id number] Date of Birth: Aug 18, 1996 Referring Provider (PT): [name]   Encounter Date: 11/05/2021   PT End of Session - 11/05/21 1548     Visit Number 1    Number of Visits 12    Date for PT Re-Evaluation 01/28/22    PT Start Time 1550    PT Stop Time 1630    PT Time Calculation (min) 40 min    Activity Tolerance Patient tolerated treatment well    Behavior During Therapy [facility] for tasks assessed/performed             Past Medical History:  Diagnosis Date   Heavy periods    Painful menstrual periods     Past Surgical History:  Procedure Laterality Date   NO PAST SURGERIES      There were no vitals filed for this visit.        [facility] PT Assessment - 11/05/21 1546       Assessment   Medical Diagnosis Pelvic pain, pressure    Referring Provider (PT) [name]    Onset Date/Surgical Date 09/17/21    Hand Dominance Right    Prior Therapy Yes      Balance Screen   Has the patient fallen in the past 6 months No            PELVIC HEALTH PHYSICAL THERAPY EVALUATION  SCREENING Red Flags: None Have you had any night sweats? Unexplained weight loss? Saddle anesthesia? Unexplained changes in bowel or bladder habits?  SUBJECTIVE  Chief Complaint: Patient notes that about 4 weeks s/p NSVD, patient experienced pain in suprapubic area. Patient notes increased pressure and discomfort with increased standing activities and with transfers. Patient denies any vaginal pressure at this time. R>L pressure.   Pertinent History:  Falls Negative.  Scoliosis Negative. Pulmonary disease/dysfunction Negative. Surgical history: Negative.   Obstetrical History: G3P3 Deliveries: NSVD Tearing/Episiotomy: none with most  recent  Gynecological History: Hysterectomy: No  Endometriosis: Negative Pelvic Organ Prolapse: Positive for during pregnancy; not noted now Last Menstrual Period: breastfeeding Pain with exam: No Heaviness/pressure: Yes (suprapubically)    Urinary History: Denies any concerns at this time.  Incontinence: Negative.  Intermittent stream: Negative  Gastrointestinal History: Denies any concerns at this time.  Pain with defecation: Negative Straining with defecation: Negative  Sexual activity/pain: Pain with intercourse: Positive for strong stretch; burning sensation.   Initial penetration: Yes  Deep thrusting: Yes   Location of pain: suprapubic Current pain:  0/10  Max pain:  5/10 Least pain:  0/10 Pain quality: pain quality: pressure and pulling Radiating pain: No    OBJECTIVE  Mental Status Patient is oriented to person, place and time.  Recent memory is intact.  Remote memory is intact.  Attention span and concentration are intact.  Expressive speech is intact.  Patient's fund of knowledge is within normal limits for educational level.  POSTURE/OBSERVATIONS:  Lumbar lordosis: mildly increased; increased anterior tilt of the pelvis Thoracic kyphosis: increased Iliac crest height: equal bilaterally Lumbar lateral shift: negative Pelvic obliquity: negative  GAIT: Increased thoracic kyphosis, grossly WFL  RANGE OF MOTION: deferred 2/2 to time constraints   LEFT RIGHT  Lumbar forward flexion (65):      Lumbar extension (30):     Lumbar lateral flexion (25):  Thoracic and Lumbar rotation (30 degrees):       Hip Flexion (0-125):      Hip IR (0-45):     Hip ER (0-45):     Hip Abduction (0-40):     Hip extension (0-15):       STRENGTH: MMT deferred 2/2 to time constraints  RLE LLE  Hip Flexion    Hip Extension    Hip Abduction     Hip Adduction     Hip ER     Hip IR     Knee Extension    Knee Flexion    Dorsiflexion     Plantarflexion (seated)      ABDOMINAL:  Palpation: TTP over LLQ and RLQ, R>L. Pain is greatest  Diastasis:>2 finger throughout, ~ 4 finger inferior to umbilicus Rib flare: absent Diaphragmatic breathing: able to coordinate with tactile and verbal cueing. Chest initiates first and is predominant.   SPECIAL TESTS: deferred 2/2 to time constraints  PHYSICAL PERFORMANCE MEASURES: STS: WNL ; increased pressure suprapubically/in the region of TrA  EXTERNAL PELVIC EXAM: deferred 2/2 to time constraints Palpation: Breath coordination: Voluntary Contraction: present/absent Relaxation: full/delayed/non-relaxing Perineal movement with sustained IAP increase ("bear down"): descent/no change/elevation/excessive descent Perineal movement with rapid IAP increase ("cough"): elevation/no change/descent  INTERNAL VAGINAL EXAM: deferred 2/2 to time constraints Introitus Appears:  Skin integrity:  Scar mobility: Strength (PERF):  Symmetry: Palpation: Prolapse:   INTERNAL RECTAL EXAM: not indicated Strength (PERF): Symmetry: Palpation: Prolapse:   OUTCOME MEASURES: FOTO (PFDI Pain 25)   ASSESSMENT Patient is a 25 year old presenting to clinic with chief complaints of pressure and pain suprapubically. Upon examination, patient demonstrates deficits in deep core strength, IAP management, posture, and pain as evidenced by 5/10 pain with transfers, difficulty coordinating diaphragmatic breathing, 4 finger breadth inferior to umbilicus, increased thoracic kyphosis and anterior pelvic tilt. Patient's responses on FOTO PFDI Pain outcome measures (25) indicate significant functional limitations/disability/distress. Patient's progress may be limited due to demands on caregiving; however, patient's previous success with PFPT is advantageous. Patient was able to achieve TrA activation with coordinated breath during today's evaluation and responded positively to active interventions. Patient will benefit from continued skilled  therapeutic intervention to address deficits in deep core strength, IAP management, posture, and pain in order to increase function and improve overall QOL.  EDUCATION Patient educated on prognosis, POC, and provided with HEP including: diaphragmatic breathing, diastasis correction, TrA activation, glute bridge. Patient articulated understanding and returned demonstration. Patient will benefit from further education in order to maximize compliance and understanding for long-term therapeutic gains.    Objective measurements completed on examination: See above findings.       PT Long Term Goals - 11/05/21 1641       PT LONG TERM GOAL #1   Title Patient will demonstrate independence with HEP in order to maximize therapeutic gains and improve carryover from physical therapy sessions to ADLs in the home and community.    Baseline IE: Access Code [email or web address]    Time 12    Period Weeks    Status New    Target Date 01/28/22      PT LONG TERM GOAL #2   Title Patient will demonstrate improved function as evidenced by a score of <10 on FOTO measure for full participation in activities at home and in the community.    Baseline IE: 25    Time 12    Period Weeks    Status New    Target Date  01/28/22      PT LONG TERM GOAL #3   Title Patient will decrease worst pain as reported on NPRS by at least 2 points to demonstrate clinically significant reduction in pain in order to restore/improve function and overall QOL.    Baseline IE: 5/10 suprapubic    Time 12    Period Weeks    Status New    Target Date 01/28/22      PT LONG TERM GOAL #4   Title Patient will have reduced DRAM inferior to umbilicus to < 2 finger breadth in order to demonstrate improved IAP management and participate fully in activities at home and in the community absent of pain/limitation.    Baseline IE: 4 finger breadth inferior to umbilicus    Time 12    Period Weeks    Status New    Target Date 01/28/22                     Plan - 11/05/21 1548     Clinical Impression Statement Patient is a 25 year old presenting to clinic with chief complaints of pressure and pain suprapubically. Upon examination, patient demonstrates deficits in deep core strength, IAP management, posture, and pain as evidenced by 5/10 pain with transfers, difficulty coordinating diaphragmatic breathing, 4 finger breadth inferior to umbilicus, increased thoracic kyphosis and anterior pelvic tilt. Patient's responses on FOTO PFDI Pain outcome measures (25) indicate significant functional limitations/disability/distress. Patient's progress may be limited due to demands on caregiving; however, patient's previous success with PFPT is advantageous. Patient was able to achieve TrA activation with coordinated breath during today's evaluation and responded positively to active interventions. Patient will benefit from continued skilled therapeutic intervention to address deficits in deep core strength, IAP management, posture, and pain in order to increase function and improve overall QOL.    Personal Factors and Comorbidities Behavior Pattern;Past/Current Experience    Examination-Activity Limitations Bed Mobility;Transfers;Squat;Lift;Carry;Reach Overhead;Caring for Others    Examination-Participation Restrictions Community Activity;Occupation;Interpersonal Relationship;Laundry;Shop;Yard Work;Cleaning    Stability/Clinical Decision Making Evolving/Moderate complexity    Clinical Decision Making Moderate    Rehab Potential Good    PT Frequency 1x / week    PT Duration 12 weeks    PT Treatment/Interventions Cryotherapy;Moist Heat;Electrical Stimulation;Therapeutic exercise;Patient/family education;Neuromuscular re-education;Manual techniques;Taping;Spinal Manipulations;Joint Manipulations;Orthotic Fit/Training    PT Next Visit Plan deep core in quadruped, standing/counter, supine    PT Home Exercise Plan [email or web address]    Consulted and Agree with  Plan of Care Patient             Patient will benefit from skilled therapeutic intervention in order to improve the following deficits and impairments:  Pain, Postural dysfunction, Decreased strength, Decreased coordination, Decreased activity tolerance, Improper body mechanics, Decreased endurance  Visit Diagnosis: Muscle weakness (generalized)  Other lack of coordination  Pelvic pain     Problem List Patient Active Problem List   Diagnosis Date Noted   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   History of preterm delivery 06/17/2018   History of heavy periods 01/02/2017   Dysmenorrhea 01/02/2017   Family history of endometriosis in first degree relative 01/02/2017   Family history of first-degree relative with cardiomyopathy 04/16/2016    Sheria Lang PT, DPT [phone]  11/05/2021, 4:55 PM  [facility] [address] [place], [place], [address] Phone: [phone]   Fax:  [phone]  Name: Loyd Salvador MRN: [id number] Date of Birth: [birth year]

## 2021-11-13 ENCOUNTER — Ambulatory Visit: Payer: Medicaid Other | Attending: Certified Nurse Midwife | Admitting: Physical Therapy

## 2021-11-13 ENCOUNTER — Encounter: Payer: Self-pay | Admitting: Physical Therapy

## 2021-11-13 ENCOUNTER — Other Ambulatory Visit: Payer: Self-pay

## 2021-11-13 DIAGNOSIS — M6281 Muscle weakness (generalized): Secondary | ICD-10-CM | POA: Insufficient documentation

## 2021-11-13 DIAGNOSIS — R102 Pelvic and perineal pain: Secondary | ICD-10-CM | POA: Diagnosis present

## 2021-11-13 DIAGNOSIS — R293 Abnormal posture: Secondary | ICD-10-CM | POA: Insufficient documentation

## 2021-11-13 DIAGNOSIS — R278 Other lack of coordination: Secondary | ICD-10-CM | POA: Insufficient documentation

## 2021-11-13 NOTE — Patient Instructions (Signed)
Access Code: [email or web address] URL: [email or web address] Date: 11/13/2021 Prepared by: Myles Gip  Exercises Supine Diaphragmatic Breathing - 10 reps Abdominal Correction with Gentle Compression - 10 reps Beginner Bridge - 1 x daily - 7 x weekly - 2 sets - 15 reps Seated Diastasis Recti Correction - 10 reps Prone Heel Squeeze - 30 reps Static Prone on Elbows - 4 sets - 5 reps Standing Transverse Abdominis Contraction - 4 sets - 5 reps Partial Squats with Baby - 5-10 reps

## 2021-11-13 NOTE — Therapy (Signed)
[facility] [address] [place], [place], [address] Phone: [phone]   Fax:  [phone]  Physical Therapy Treatment  Patient Details  Name: Bonnie Graham MRN: [id number] Date of Birth: [birth year] Referring Provider (PT): Philip Aspen   Encounter Date: 11/13/2021   PT End of Session - 11/13/21 1638     Visit Number 2    Number of Visits 12    Date for PT Re-Evaluation 01/28/22    PT Start Time 1630    PT Stop Time 1710    PT Time Calculation (min) 40 min    Activity Tolerance Patient tolerated treatment well    Behavior During Therapy [facility] for tasks assessed/performed             Past Medical History:  Diagnosis Date   Heavy periods    Painful menstrual periods     Past Surgical History:  Procedure Laterality Date   NO PAST SURGERIES      There were no vitals filed for this visit.   Subjective Assessment - 11/13/21 1637     Subjective Patient states that she is doing well. She notes that she did have a fall on her tailbone and has some ecchymosis lateral to the tailbone. Patient has had a decrease in lower abdominal pressure.    Currently in Pain? No/denies             TREATMENT  Pre-treatment assessment: coccyx deviated L with increased R sided tenderness. No excessive flexion of coccyx. 5/10 pain with palpation of R  Manual Therapy: STM and TPR performed to muscular structures surrounding coccyx to allow for decreased tension and pain and improved posture and function L-R lateral mobilizations of coccyx for decreased spasm and improved mobility, grade II/III MFR of sacral ligaments for improved pain modulation and coccyx alignment  Neuromuscular Re-education: Prone heel squeeze for improved posture and alignment of coccyx Prone prop TrA activation with coordinated breath for improved postural endurance and closure of DRAM Standing counter TrA activation with coordinated breath for improved  postural endurance and closure of DRAM STS and partial squats with baby (8# med ball), VCs for body mechanics and to initiate with hip hinge and use abdominal bracing to support posture    Patient educated throughout session on appropriate technique and form using multi-modal cueing, HEP, and activity modification. Patient articulated understanding and returned demonstration.  Patient Response to interventions: Comfortable to trial exercises for a week. Return in 1 week for progressions  ASSESSMENT Patient presents to clinic with excellent motivation to participate in therapy. Patient demonstrates deficits in deep core strength, IAP management, posture, and pain. Patient able to achieve coordinated and consistent TrA activation in prone and standing postures with minimal cueing during today's session and responded positively to active interventions. Patient will benefit from continued skilled therapeutic intervention to address remaining deficits in deep core strength, IAP management, posture, and pain in order to increase function and improve overall QOL.     PT Long Term Goals - 11/05/21 1641       PT LONG TERM GOAL #1   Title Patient will demonstrate independence with HEP in order to maximize therapeutic gains and improve carryover from physical therapy sessions to ADLs in the home and community.    Baseline IE: Access Code [email or web address]    Time 12    Period Weeks    Status New    Target Date 01/28/22      PT LONG TERM GOAL #  2   Title Patient will demonstrate improved function as evidenced by a score of <10 on FOTO measure for full participation in activities at home and in the community.    Baseline IE: 25    Time 12    Period Weeks    Status New    Target Date 01/28/22      PT LONG TERM GOAL #3   Title Patient will decrease worst pain as reported on NPRS by at least 2 points to demonstrate clinically significant reduction in pain in order to restore/improve function and overall  QOL.    Baseline IE: 5/10 suprapubic    Time 12    Period Weeks    Status New    Target Date 01/28/22      PT LONG TERM GOAL #4   Title Patient will have reduced DRAM inferior to umbilicus to < 2 finger breadth in order to demonstrate improved IAP management and participate fully in activities at home and in the community absent of pain/limitation.    Baseline IE: 4 finger breadth inferior to umbilicus    Time 12    Period Weeks    Status New    Target Date 01/28/22                   Plan - 11/13/21 1639     Clinical Impression Statement Patient presents to clinic with excellent motivation to participate in therapy. Patient demonstrates deficits in deep core strength, IAP management, posture, and pain. Patient able to achieve coordinated and consistent TrA activation in prone and standing postures with minimal cueing during today's session and responded positively to active interventions. Patient will benefit from continued skilled therapeutic intervention to address remaining deficits in deep core strength, IAP management, posture, and pain in order to increase function and improve overall QOL.    Personal Factors and Comorbidities Behavior Pattern;Past/Current Experience    Examination-Activity Limitations Bed Mobility;Transfers;Squat;Lift;Carry;Reach Overhead;Caring for Others    Examination-Participation Restrictions Community Activity;Occupation;Interpersonal Relationship;Laundry;Shop;Yard Work;Cleaning    Stability/Clinical Decision Making Evolving/Moderate complexity    Rehab Potential Good    PT Frequency 1x / week    PT Duration 12 weeks    PT Treatment/Interventions Cryotherapy;Moist Heat;Electrical Stimulation;Therapeutic exercise;Patient/family education;Neuromuscular re-education;Manual techniques;Taping;Spinal Manipulations;Joint Manipulations;Orthotic Fit/Training    PT Next Visit Plan deep core in quadruped, standing/counter, supine    PT Home Exercise Plan  [id number]    Consulted and Agree with Plan of Care Patient             Patient will benefit from skilled therapeutic intervention in order to improve the following deficits and impairments:  Pain, Postural dysfunction, Decreased strength, Decreased coordination, Decreased activity tolerance, Improper body mechanics, Decreased endurance  Visit Diagnosis: Muscle weakness (generalized)  Other lack of coordination  Abnormal posture     Problem List Patient Active Problem List   Diagnosis Date Noted   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   History of preterm delivery 06/17/2018   History of heavy periods 01/02/2017   Dysmenorrhea 01/02/2017   Family history of endometriosis in first degree relative 01/02/2017   Family history of first-degree relative with cardiomyopathy 04/16/2016    Myles Gip PT, DPT [phone]  11/13/2021, 5:29 PM  [facility] [address] [place], [place], [address] Phone: [phone]   Fax:  [phone]  Name: Bonnie Graham MRN: [id number] Date of Birth: 05-Feb-1996

## 2021-11-18 ENCOUNTER — Ambulatory Visit: Payer: Medicaid Other | Admitting: Physical Therapy

## 2021-11-18 ENCOUNTER — Encounter: Payer: Self-pay | Admitting: Physical Therapy

## 2021-11-18 ENCOUNTER — Other Ambulatory Visit: Payer: Self-pay

## 2021-11-18 DIAGNOSIS — M6281 Muscle weakness (generalized): Secondary | ICD-10-CM

## 2021-11-18 DIAGNOSIS — R102 Pelvic and perineal pain: Secondary | ICD-10-CM

## 2021-11-18 DIAGNOSIS — R293 Abnormal posture: Secondary | ICD-10-CM

## 2021-11-18 DIAGNOSIS — R278 Other lack of coordination: Secondary | ICD-10-CM

## 2021-11-18 NOTE — Therapy (Signed)
[facility] [address] [place], [place], [address] Phone: [phone]   Fax:  [phone]  Physical Therapy Treatment  Patient Details  Name: Bonnie Graham MRN: [id number] Date of Birth: 1996/03/13 Referring Provider (PT): [name]   Encounter Date: 11/18/2021   PT End of Session - 11/18/21 1643     Visit Number 3    Number of Visits 12    Date for PT Re-Evaluation 01/28/22    PT Start Time 1631    PT Stop Time 1710    PT Time Calculation (min) 39 min    Activity Tolerance Patient tolerated treatment well    Behavior During Therapy [facility] for tasks assessed/performed             Past Medical History:  Diagnosis Date   Heavy periods    Painful menstrual periods     Past Surgical History:  Procedure Laterality Date   NO PAST SURGERIES      There were no vitals filed for this visit.   Subjective Assessment - 11/18/21 1633     Subjective Patient reports that her pain/pressure has been better; she only noticed it on Saturday and Sunday when doing a lot of physical activity. Patient has had increased tailbone pain when in certain postures (nursing in bed, siting in car); worst pain is 5/10.    Currently in Pain? No/denies             TREATMENT  Neuromuscular Re-education: Cat-cow with coordinated breath for improved spinal mobility and postural awareness Quadruped rocking with coordinated breath for improved spinal mobility and gluteal activation Quadruped transverse abdominus bracing with coordinated breath for improved core stabilization Quadruped transverse abdominus bracing with alternating UE reach for improved core stabilization Patient education on seated postures and seat modifications to reduce compression/pressure on the tailbone.    Patient educated throughout session on appropriate technique and form using multi-modal cueing, HEP, and activity modification. Patient articulated understanding  and returned demonstration.  Patient Response to interventions: Patient had increased suprapubic and vaginal pressure with increased repetitions of quadruped bracing with UE reach.  ASSESSMENT Patient presents to clinic with excellent motivation to participate in therapy. Patient demonstrates deficits in deep core strength, IAP management, posture, and pain. Patient had some increased pressure with increased repetitions of quadruped core stabilization exercises, but able to achieve coordinated and consistent TrA activation initially during today's session and responded positively to active and educational interventions. Patient Bonnie benefit from continued skilled therapeutic intervention to address remaining deficits in deep core strength, IAP management, posture, and pain in order to increase function and improve overall QOL.   PT Long Term Goals - 11/05/21 1641       PT LONG TERM GOAL #1   Title Patient Bonnie demonstrate independence with HEP in order to maximize therapeutic gains and improve carryover from physical therapy sessions to ADLs in the home and community.    Baseline IE: Access Code [email or web address]    Time 12    Period Weeks    Status New    Target Date 01/28/22      PT LONG TERM GOAL #2   Title Patient Bonnie demonstrate improved function as evidenced by a score of <10 on FOTO measure for full participation in activities at home and in the community.    Baseline IE: 25    Time 12    Period Weeks    Status New    Target Date 01/28/22  PT LONG TERM GOAL #3   Title Patient Bonnie decrease worst pain as reported on NPRS by at least 2 points to demonstrate clinically significant reduction in pain in order to restore/improve function and overall QOL.    Baseline IE: 5/10 suprapubic    Time 12    Period Weeks    Status New    Target Date 01/28/22      PT LONG TERM GOAL #4   Title Patient Bonnie have reduced DRAM inferior to umbilicus to < 2 finger breadth in order to demonstrate  improved IAP management and participate fully in activities at home and in the community absent of pain/limitation.    Baseline IE: 4 finger breadth inferior to umbilicus    Time 12    Period Weeks    Status New    Target Date 01/28/22                   Plan - 11/18/21 1644     Clinical Impression Statement Patient presents to clinic with excellent motivation to participate in therapy. Patient demonstrates deficits in deep core strength, IAP management, posture, and pain. Patient had some increased pressure with increased repetitions of quadruped core stabilization exercises, but able to achieve coordinated and consistent TrA activation initially during today's session and responded positively to active and educational interventions. Patient Bonnie benefit from continued skilled therapeutic intervention to address remaining deficits in deep core strength, IAP management, posture, and pain in order to increase function and improve overall QOL.    Personal Factors and Comorbidities Behavior Pattern;Past/Current Experience    Examination-Activity Limitations Bed Mobility;Transfers;Squat;Lift;Carry;Reach Overhead;Caring for Others    Examination-Participation Restrictions Community Activity;Occupation;Interpersonal Relationship;Laundry;Shop;Yard Work;Cleaning    Stability/Clinical Decision Making Evolving/Moderate complexity    Rehab Potential Good    PT Frequency 1x / week    PT Duration 12 weeks    PT Treatment/Interventions Cryotherapy;Moist Heat;Electrical Stimulation;Therapeutic exercise;Patient/family education;Neuromuscular re-education;Manual techniques;Taping;Spinal Manipulations;Joint Manipulations;Orthotic Fit/Training    PT Next Visit Plan deep core in quadruped, standing/counter, supine    PT Home Exercise Plan [email or web address]    Consulted and Agree with Plan of Care Patient             Patient Bonnie benefit from skilled therapeutic intervention in order to improve the following  deficits and impairments:  Pain, Postural dysfunction, Decreased strength, Decreased coordination, Decreased activity tolerance, Improper body mechanics, Decreased endurance  Visit Diagnosis: Muscle weakness (generalized)  Other lack of coordination  Abnormal posture  Pelvic pain     Problem List Patient Active Problem List   Diagnosis Date Noted   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   History of preterm delivery 06/17/2018   History of heavy periods 01/02/2017   Dysmenorrhea 01/02/2017   Family history of endometriosis in first degree relative 01/02/2017   Family history of first-degree relative with cardiomyopathy 04/16/2016    Sheria Lang PT, DPT [phone]  11/18/2021, 5:24 PM  [facility] [address] [place], [place], [address] Phone: [phone]   Fax:  [phone]  Name: Bonnie Graham MRN: [id number] Date of Birth: 1996-10-10

## 2021-11-20 ENCOUNTER — Ambulatory Visit: Payer: Medicaid Other | Admitting: Physical Therapy

## 2021-11-25 ENCOUNTER — Encounter: Payer: Medicaid Other | Admitting: Physical Therapy

## 2021-11-27 ENCOUNTER — Ambulatory Visit: Payer: Medicaid Other | Admitting: Physical Therapy

## 2021-11-27 ENCOUNTER — Encounter: Payer: Medicaid Other | Admitting: Physical Therapy

## 2021-11-28 ENCOUNTER — Encounter: Payer: Self-pay | Admitting: Certified Nurse Midwife

## 2021-11-28 ENCOUNTER — Encounter: Payer: Self-pay | Admitting: Physical Therapy

## 2021-11-28 ENCOUNTER — Ambulatory Visit: Payer: Medicaid Other | Admitting: Physical Therapy

## 2021-11-28 ENCOUNTER — Other Ambulatory Visit: Payer: Self-pay

## 2021-11-28 DIAGNOSIS — M6281 Muscle weakness (generalized): Secondary | ICD-10-CM | POA: Diagnosis not present

## 2021-11-28 DIAGNOSIS — R293 Abnormal posture: Secondary | ICD-10-CM

## 2021-11-28 DIAGNOSIS — R102 Pelvic and perineal pain: Secondary | ICD-10-CM

## 2021-11-28 DIAGNOSIS — R278 Other lack of coordination: Secondary | ICD-10-CM

## 2021-11-28 NOTE — Therapy (Signed)
[facility] [address] [place], [place], [address] Phone: [phone]   Fax:  [phone]  Physical Therapy Treatment  Patient Details  Name: Bonnie Graham MRN: [id number] Date of Birth: 18-Apr-1996 Referring Provider (PT): [name]   Encounter Date: 11/28/2021   PT End of Session - 11/28/21 1640     Visit Number 4    Number of Visits 12    Date for PT Re-Evaluation 01/28/22    PT Start Time 1635    PT Stop Time 1715    PT Time Calculation (min) 40 min    Activity Tolerance Patient tolerated treatment well    Behavior During Therapy [facility] for tasks assessed/performed             Past Medical History:  Diagnosis Date   Heavy periods    Painful menstrual periods     Past Surgical History:  Procedure Laterality Date   NO PAST SURGERIES      There were no vitals filed for this visit.   Subjective Assessment - 11/28/21 1638     Subjective Patient reports that her tailbone pain has improved; she feels it is 85% improved. Patient continues to have pain/pressure with more activity but this is happening with less frequency. Patient did note after last session some increased pressure but feels this may have been from fatigued muscles.    Currently in Pain? No/denies            TREATMENT  Neuromuscular Re-education: Prone diaphragmatic breathing for abdominal prep and body awareness Prone prop TrA activation with coordinated breath for improved postural endurance and closure of DRAM Cat-cow with coordinated breath for improved spinal mobility and postural awareness Quadruped rocking with coordinated breath for improved spinal mobility and gluteal activation Quadruped transverse abdominus bracing with coordinated breath for improved core stabilization Supine hooklying transverse abdominus bracing with coordinated breath for improved core stabilization Supine hooklying transverse abdominus bracing with  coordinated breath and table press for improved core stabilization Standing transverse abdominus bracing with coordinated breath and counter press for improved core stabilization   Patient educated throughout session on appropriate technique and form using multi-modal cueing, HEP, and activity modification. Patient articulated understanding and returned demonstration.  Patient Response to interventions: Patient comfortable to f/u in3 weeks and continue with HEP.  ASSESSMENT Patient presents to clinic with excellent motivation to participate in therapy. Patient demonstrates deficits in deep core strength, IAP management, posture, and pain. Patient with excellent form and consistent ability to contract TrA in prone, quadruped, and standing postures during today's session and responded positively to active interventions. Patient will benefit from continued skilled therapeutic intervention to address remaining deficits in deep core strength, IAP management, posture, and pain in order to increase function and improve overall QOL.     PT Long Term Goals - 11/05/21 1641       PT LONG TERM GOAL #1   Title Patient will demonstrate independence with HEP in order to maximize therapeutic gains and improve carryover from physical therapy sessions to ADLs in the home and community.    Baseline IE: Access Code [email or web address]    Time 12    Period Weeks    Status New    Target Date 01/28/22      PT LONG TERM GOAL #2   Title Patient will demonstrate improved function as evidenced by a score of <10 on FOTO measure for full participation in activities at home and in the community.  Baseline IE: 25    Time 12    Period Weeks    Status New    Target Date 01/28/22      PT LONG TERM GOAL #3   Title Patient will decrease worst pain as reported on NPRS by at least 2 points to demonstrate clinically significant reduction in pain in order to restore/improve function and overall QOL.    Baseline IE: 5/10  suprapubic    Time 12    Period Weeks    Status New    Target Date 01/28/22      PT LONG TERM GOAL #4   Title Patient will have reduced DRAM inferior to umbilicus to < 2 finger breadth in order to demonstrate improved IAP management and participate fully in activities at home and in the community absent of pain/limitation.    Baseline IE: 4 finger breadth inferior to umbilicus    Time 12    Period Weeks    Status New    Target Date 01/28/22                   Plan - 11/28/21 1641     Clinical Impression Statement Patient presents to clinic with excellent motivation to participate in therapy. Patient demonstrates deficits in deep core strength, IAP management, posture, and pain. Patient with excellent form and consistent ability to contract TrA in prone, quadruped, and standing postures during today's session and responded positively to active interventions. Patient will benefit from continued skilled therapeutic intervention to address remaining deficits in deep core strength, IAP management, posture, and pain in order to increase function and improve overall QOL.    Personal Factors and Comorbidities Behavior Pattern;Past/Current Experience    Examination-Activity Limitations Bed Mobility;Transfers;Squat;Lift;Carry;Reach Overhead;Caring for Others    Examination-Participation Restrictions Community Activity;Occupation;Interpersonal Relationship;Laundry;Shop;Yard Work;Cleaning    Stability/Clinical Decision Making Evolving/Moderate complexity    Rehab Potential Good    PT Frequency 1x / week    PT Duration 12 weeks    PT Treatment/Interventions Cryotherapy;Moist Heat;Electrical Stimulation;Therapeutic exercise;Patient/family education;Neuromuscular re-education;Manual techniques;Taping;Spinal Manipulations;Joint Manipulations;Orthotic Fit/Training    PT Next Visit Plan deep core in quadruped, standing/counter, supine    PT Home Exercise Plan [email or web address]    Consulted and Agree with  Plan of Care Patient             Patient will benefit from skilled therapeutic intervention in order to improve the following deficits and impairments:  Pain, Postural dysfunction, Decreased strength, Decreased coordination, Decreased activity tolerance, Improper body mechanics, Decreased endurance  Visit Diagnosis: Muscle weakness (generalized)  Other lack of coordination  Abnormal posture  Pelvic pain     Problem List Patient Active Problem List   Diagnosis Date Noted   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   History of preterm delivery 06/17/2018   History of heavy periods 01/02/2017   Dysmenorrhea 01/02/2017   Family history of endometriosis in first degree relative 01/02/2017   Family history of first-degree relative with cardiomyopathy 04/16/2016   Sheria Lang PT, DPT [phone]  11/28/2021, 5:24 PM  [place] [address] [place], [place], [address] Phone: [phone]   Fax:  [phone]  Name: Bonnie Graham MRN: [id number] Date of Birth: 12/16/1995

## 2021-11-29 ENCOUNTER — Other Ambulatory Visit: Payer: Self-pay | Admitting: Obstetrics

## 2021-11-29 ENCOUNTER — Encounter: Payer: Self-pay | Admitting: Obstetrics

## 2021-11-29 MED ORDER — MICONAZOLE NITRATE 2 % EX CREA: TOPICAL_CREAM | CUTANEOUS | Status: DC | PRN

## 2021-12-02 ENCOUNTER — Other Ambulatory Visit: Payer: Self-pay | Admitting: Certified Nurse Midwife

## 2021-12-02 ENCOUNTER — Encounter: Payer: Medicaid Other | Admitting: Physical Therapy

## 2021-12-02 ENCOUNTER — Encounter: Payer: Self-pay | Admitting: Certified Nurse Midwife

## 2021-12-02 MED ORDER — MICONAZOLE NITRATE 2 % EX CREA: 1.0000 "application " | TOPICAL_CREAM | Freq: Two times a day (BID) | CUTANEOUS | 0 refills | Status: DC

## 2021-12-04 ENCOUNTER — Ambulatory Visit: Payer: Medicaid Other | Admitting: Physical Therapy

## 2021-12-09 ENCOUNTER — Encounter: Payer: Medicaid Other | Admitting: Physical Therapy

## 2021-12-11 ENCOUNTER — Ambulatory Visit: Payer: Medicaid Other | Admitting: Physical Therapy

## 2021-12-16 ENCOUNTER — Ambulatory Visit: Payer: Medicaid Other | Admitting: Physical Therapy

## 2021-12-18 ENCOUNTER — Ambulatory Visit: Payer: Medicaid Other | Admitting: Physical Therapy

## 2021-12-25 ENCOUNTER — Ambulatory Visit: Payer: Medicaid Other | Admitting: Physical Therapy

## 2021-12-26 ENCOUNTER — Encounter: Payer: Self-pay | Admitting: Physical Therapy

## 2021-12-26 ENCOUNTER — Ambulatory Visit: Payer: Medicaid Other | Attending: Certified Nurse Midwife | Admitting: Physical Therapy

## 2021-12-26 ENCOUNTER — Other Ambulatory Visit: Payer: Self-pay

## 2021-12-26 DIAGNOSIS — R293 Abnormal posture: Secondary | ICD-10-CM | POA: Diagnosis present

## 2021-12-26 DIAGNOSIS — M6281 Muscle weakness (generalized): Secondary | ICD-10-CM | POA: Insufficient documentation

## 2021-12-26 DIAGNOSIS — R278 Other lack of coordination: Secondary | ICD-10-CM | POA: Diagnosis present

## 2021-12-26 DIAGNOSIS — R102 Pelvic and perineal pain: Secondary | ICD-10-CM | POA: Insufficient documentation

## 2021-12-26 NOTE — Therapy (Addendum)
[facility] [address] [place], [place], [address] Phone: [phone]   Fax:  [phone]  Physical Therapy Treatment  Patient Details  Name: Bonnie Graham MRN: [id number] Date of Birth: 21-Aug-1996 Referring Provider (PT): [name]   Encounter Date: 12/26/2021   PT End of Session - 12/26/21 1506     Visit Number 5    Number of Visits 12    Date for PT Re-Evaluation 01/28/22    PT Start Time 1505    PT Stop Time 1545    PT Time Calculation (min) 40 min    Activity Tolerance Patient tolerated treatment well    Behavior During Therapy [facility] for tasks assessed/performed             Past Medical History:  Diagnosis Date   Heavy periods    Painful menstrual periods     Past Surgical History:  Procedure Laterality Date   NO PAST SURGERIES      There were no vitals filed for this visit.   Subjective Assessment - 12/26/21 1756     Subjective Patient presents to clinic with concern about two recent events. Patient notes that after a recent 1 mi walk, she had increased vaginal pressure and later inthe evening when laying down she felt increased vaginal and rectal pressure. The rectal pressure is a new sensation for the patient. Also, patient had recent bout of constipation and after some straining for BM, patient felt a drop/increase in pressure in the pelvis. Upon closer inspection, patient saw a ball of tissue at the vaginal opening but not through.    Currently in Pain? No/denies            TREATMENT  Neuromuscular Re-education: Patient education on pelvic organ prolapse (POP), the relationship between body mechanics, gravity, and pressure on POP progression. Patient education on postures for gravity assistance with repositioning of pelvic organs with the principle of "hips higher than shoulders".  Supine supported bridge with diaphragmatic breathing for improved pelvic organ position Supine supported bridge  with PFM contractions, x5 for improved pelvic organ position Supine glute bridge x5 for improved pelvic organ position Patient education on "the knack"/ exhale and Kegel for lifting and rising to train motor control/coordination for pelvic organ support with movements against gravity.   Patient educated throughout session on appropriate technique and form using multi-modal cueing, HEP, and activity modification. Patient articulated understanding and returned demonstration.  Patient Response to interventions: Patient comfortable to f/u in 2 weeks and integrate daily PFM contractions and gravity assisted postures  ASSESSMENT Patient presents to clinic with excellent motivation to participate in therapy. Patient demonstrates deficits in deep core strength, IAP management, posture, and pain. Patient able to achieve gravity assisted position and perform 5 PFM contractions with good quality during today's session and responded positively to educatoinal interventions. Patient will benefit from continued skilled therapeutic intervention to address remaining deficits in deep core strength, IAP management, posture, and pain in order to increase function and improve overall QOL.    PT Long Term Goals - 11/05/21 1641       PT LONG TERM GOAL #1   Title Patient will demonstrate independence with HEP in order to maximize therapeutic gains and improve carryover from physical therapy sessions to ADLs in the home and community.    Baseline IE: Access Code [email or web address]    Time 12    Period Weeks    Status New    Target Date 01/28/22  PT LONG TERM GOAL #2   Title Patient will demonstrate improved function as evidenced by a score of <10 on FOTO measure for full participation in activities at home and in the community.    Baseline IE: 25    Time 12    Period Weeks    Status New    Target Date 01/28/22      PT LONG TERM GOAL #3   Title Patient will decrease worst pain as reported on NPRS by at least 2  points to demonstrate clinically significant reduction in pain in order to restore/improve function and overall QOL.    Baseline IE: 5/10 suprapubic    Time 12    Period Weeks    Status New    Target Date 01/28/22      PT LONG TERM GOAL #4   Title Patient will have reduced DRAM inferior to umbilicus to < 2 finger breadth in order to demonstrate improved IAP management and participate fully in activities at home and in the community absent of pain/limitation.    Baseline IE: 4 finger breadth inferior to umbilicus    Time 12    Period Weeks    Status New    Target Date 01/28/22                   Plan - 12/26/21 1506     Clinical Impression Statement Patient presents to clinic with excellent motivation to participate in therapy. Patient demonstrates deficits in deep core strength, IAP management, posture, and pain. Patient able to achieve gravity assisted position and perform 5 PFM contractions with good quality during today's session and responded positively to educatoinal interventions. Patient will benefit from continued skilled therapeutic intervention to address remaining deficits in deep core strength, IAP management, posture, and pain in order to increase function and improve overall QOL.    Personal Factors and Comorbidities Behavior Pattern;Past/Current Experience    Examination-Activity Limitations Bed Mobility;Transfers;Squat;Lift;Carry;Reach Overhead;Caring for Others    Examination-Participation Restrictions Community Activity;Occupation;Interpersonal Relationship;Laundry;Shop;Yard Work;Cleaning    Stability/Clinical Decision Making Evolving/Moderate complexity    Rehab Potential Good    PT Frequency 1x / week    PT Duration 12 weeks    PT Treatment/Interventions Cryotherapy;Moist Heat;Electrical Stimulation;Therapeutic exercise;Patient/family education;Neuromuscular re-education;Manual techniques;Taping;Spinal Manipulations;Joint Manipulations;Orthotic Fit/Training     PT Next Visit Plan deep core in quadruped, standing/counter, supine    PT Home Exercise Plan [email or web address]    Consulted and Agree with Plan of Care Patient             Patient will benefit from skilled therapeutic intervention in order to improve the following deficits and impairments:  Pain, Postural dysfunction, Decreased strength, Decreased coordination, Decreased activity tolerance, Improper body mechanics, Decreased endurance  Visit Diagnosis: Muscle weakness (generalized)  Other lack of coordination  Abnormal posture     Problem List Patient Active Problem List   Diagnosis Date Noted   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   History of preterm delivery 06/17/2018   History of heavy periods 01/02/2017   Dysmenorrhea 01/02/2017   Family history of endometriosis in first degree relative 01/02/2017   Family history of first-degree relative with cardiomyopathy 04/16/2016    Sheria Lang PT, DPT [phone]  12/26/2021, 6:09 PM  [place] [facility] [address] [place], [place], [address] Phone: [phone]   Fax:  [phone]  Name: Naziyah Tieszen MRN: [id number] Date of Birth: [birth year]

## 2022-01-01 ENCOUNTER — Ambulatory Visit: Payer: Medicaid Other | Admitting: Physical Therapy

## 2022-01-07 ENCOUNTER — Ambulatory Visit: Payer: Medicaid Other | Admitting: Physical Therapy

## 2022-01-07 ENCOUNTER — Other Ambulatory Visit: Payer: Self-pay

## 2022-01-07 ENCOUNTER — Encounter: Payer: Self-pay | Admitting: Physical Therapy

## 2022-01-07 DIAGNOSIS — R102 Pelvic and perineal pain: Secondary | ICD-10-CM

## 2022-01-07 DIAGNOSIS — R293 Abnormal posture: Secondary | ICD-10-CM

## 2022-01-07 DIAGNOSIS — M6281 Muscle weakness (generalized): Secondary | ICD-10-CM | POA: Diagnosis not present

## 2022-01-07 DIAGNOSIS — R278 Other lack of coordination: Secondary | ICD-10-CM

## 2022-01-07 NOTE — Therapy (Signed)
[place] [facility] [address] [place], [place], [address] Phone: [phone]   Fax:  [phone]  Physical Therapy Treatment  Patient Details  Name: Bonnie Graham MRN: [id number] Date of Birth: [birth year] Referring Provider (PT): [name]   Encounter Date: 01/07/2022   PT End of Session - 01/07/22 1726     Visit Number 6    Number of Visits 12    Date for PT Re-Evaluation 01/28/22    PT Start Time 1720    PT Stop Time 1800    PT Time Calculation (min) 40 min    Activity Tolerance Patient tolerated treatment well    Behavior During Therapy [facility] for tasks assessed/performed             Past Medical History:  Diagnosis Date   Heavy periods    Painful menstrual periods     Past Surgical History:  Procedure Laterality Date   NO PAST SURGERIES      There were no vitals filed for this visit.   Subjective Assessment - 01/07/22 1723     Subjective Patient notes no new changes since last visit. Denies recurrence of the ball of tissue/bulge in the pelvis. Patient notes that with running around with children she can feel fatigue in the pelvis but no dropping.    Currently in Pain? No/denies            TREATMENT  Neuromuscular Re-education: Reassessed goals; see below.  Reviewed signs/symptoms of PFM fatigue for safe return to home workouts.  Reviewed online workout videos with patient for clearance to trial more regular home workouts for transition to self-management.  Patient educated throughout session on appropriate technique and form using multi-modal cueing, HEP, and activity modification. Patient articulated understanding and returned demonstration.  Patient Response to interventions: Comfortable to return in 4 weeks.  ASSESSMENT Patient presents to clinic with excellent motivation to participate in therapy. Patient demonstrates deficits in deep core strength, IAP management, posture, and pain. Patient  indicating progress toward goal achievement during today's session and responded positively to educational interventions. Patient will benefit from continued skilled therapeutic intervention to address remaining deficits in deep core strength, IAP management, posture, and pain in order to increase function and improve overall QOL.    PT Long Term Goals - 01/07/22 1728       PT LONG TERM GOAL #1   Title Patient will demonstrate independence with HEP in order to maximize therapeutic gains and improve carryover from physical therapy sessions to ADLs in the home and community.    Baseline IE: Access Code [email or web address]    Time 12    Period Weeks    Status Achieved    Target Date 01/28/22      PT LONG TERM GOAL #2   Title Patient will demonstrate improved function as evidenced by a score of <10 on FOTO measure for full participation in activities at home and in the community.    Baseline IE: 25; 2/28: 17    Time 12    Period Weeks    Status New    Target Date 01/28/22      PT LONG TERM GOAL #3   Title Patient will decrease worst pain as reported on NPRS by at least 2 points to demonstrate clinically significant reduction in pain in order to restore/improve function and overall QOL.    Baseline IE: 5/10 suprapubic; 2/28: 1-2/10    Time 12    Period Weeks  Status On-going    Target Date 01/28/22      PT LONG TERM GOAL #4   Title Patient will have reduced DRAM inferior to umbilicus to < 2 finger breadth in order to demonstrate improved IAP management and participate fully in activities at home and in the community absent of pain/limitation.    Baseline IE: 4 finger breadth inferior to umbilicus; 2/28: < 2 finger throughout, depth to DIP inferior    Time 12    Period Weeks    Status On-going    Target Date 01/28/22                   Plan - 01/07/22 1726     Clinical Impression Statement Patient presents to clinic with excellent motivation to participate in therapy. Patient  demonstrates deficits in deep core strength, IAP management, posture, and pain. Patient indicating progress toward goal achievement during today's session and responded positively to educational interventions. Patient will benefit from continued skilled therapeutic intervention to address remaining deficits in deep core strength, IAP management, posture, and pain in order to increase function and improve overall QOL.    Personal Factors and Comorbidities Behavior Pattern;Past/Current Experience    Examination-Activity Limitations Bed Mobility;Transfers;Squat;Lift;Carry;Reach Overhead;Caring for Others    Examination-Participation Restrictions Community Activity;Occupation;Interpersonal Relationship;Laundry;Shop;Yard Work;Cleaning    Stability/Clinical Decision Making Evolving/Moderate complexity    Rehab Potential Good    PT Frequency 1x / week    PT Duration 12 weeks    PT Treatment/Interventions Cryotherapy;Moist Heat;Electrical Stimulation;Therapeutic exercise;Patient/family education;Neuromuscular re-education;Manual techniques;Taping;Spinal Manipulations;Joint Manipulations;Orthotic Fit/Training    PT Next Visit Plan deep core in quadruped, standing/counter, supine    PT Home Exercise Plan [id number]    Consulted and Agree with Plan of Care Patient             Patient will benefit from skilled therapeutic intervention in order to improve the following deficits and impairments:  Pain, Postural dysfunction, Decreased strength, Decreased coordination, Decreased activity tolerance, Improper body mechanics, Decreased endurance  Visit Diagnosis: Muscle weakness (generalized)  Other lack of coordination  Abnormal posture  Pelvic pain     Problem List Patient Active Problem List   Diagnosis Date Noted   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   History of preterm delivery 06/17/2018   History of heavy periods 01/02/2017   Dysmenorrhea 01/02/2017   Family history of endometriosis in first  degree relative 01/02/2017   Family history of first-degree relative with cardiomyopathy 04/16/2016    Sheria Lang PT, DPT [phone]  01/07/2022, 6:14 PM  [facility] [address] [place], [place], [address] Phone: [phone]   Fax:  [phone]  Name: Bonnie Graham MRN: [id number] Date of Birth: October 14, 1996

## 2022-01-08 ENCOUNTER — Ambulatory Visit: Payer: Medicaid Other | Admitting: Physical Therapy

## 2022-01-15 ENCOUNTER — Ambulatory Visit: Payer: Medicaid Other | Admitting: Physical Therapy

## 2022-02-06 ENCOUNTER — Ambulatory Visit: Payer: Medicaid Other | Attending: Certified Nurse Midwife | Admitting: Physical Therapy

## 2022-02-06 ENCOUNTER — Encounter: Payer: Self-pay | Admitting: Physical Therapy

## 2022-02-06 DIAGNOSIS — R278 Other lack of coordination: Secondary | ICD-10-CM | POA: Diagnosis present

## 2022-02-06 DIAGNOSIS — M6281 Muscle weakness (generalized): Secondary | ICD-10-CM | POA: Insufficient documentation

## 2022-02-06 DIAGNOSIS — R293 Abnormal posture: Secondary | ICD-10-CM | POA: Diagnosis present

## 2022-02-06 NOTE — Therapy (Signed)
[place] ?[facility] ?[address] [place], [place], [address] ?Phone: [phone]   Fax:  [phone] ? ?Physical Therapy Treatment/Discharge ? ?Patient Details  ?Name: Bonnie Graham ?MRN: [id number] ?Date of Birth: Apr 07, 1996 ?Referring Provider (PT): Philip Aspen ? ? ?Encounter Date: 02/06/2022 ? ? PT End of Session - 02/06/22 1634   ? ? Visit Number 7   ? Number of Visits 13   ? Date for PT Re-Evaluation 02/06/22   ? PT Start Time 1630   ? PT Stop Time 1937   ? PT Time Calculation (min) 45 min   ? Activity Tolerance Patient tolerated treatment well   ? Behavior During Therapy [facility] for tasks assessed/performed   ? ?  ?  ? ?  ? ? ?Past Medical History:  ?Diagnosis Date  ? Heavy periods   ? Painful menstrual periods   ? ? ?Past Surgical History:  ?Procedure Laterality Date  ? NO PAST SURGERIES    ? ? ?There were no vitals filed for this visit. ? ? Subjective Assessment - 02/06/22 1635   ? ? Subjective Patient reports that she continues to feel symptoms but this is variable and on a day to day basis. She reports that the symptoms of pulling and heaviness are usually on higher activity days. Patient had one day in the month where she had pelvic pain which was worse on the R than L, but present B. Patient denies any other symptoms. Patient feels HEP is still helpful.   ? Currently in Pain? No/denies   ? ?  ?  ? ?  ? ?TREATMENT ? ?Neuromuscular Re-education: ?Reassessed goals; see below.  ?Reassessed PFM strength: quick flicks, 9-0/2IOX, x6 reps. Endurance 5 sec hold x1, 4 sec hold x1 ? ?Patient educated throughout session on appropriate technique and form using multi-modal cueing, HEP, and activity modification. Patient articulated understanding and returned demonstration. ? ?Patient Response to interventions: ?Comfortable to d/c to self-management. ? ?ASSESSMENT ?Patient presents to clinic with excellent motivation to participate in therapy. Patient demonstrates minimal  deficits in deep core strength, IAP management, posture, and pain. Patient continuing to progress toward goals and has demonstrated competence with self-management over the past 4 weeks. Due to patient's long commute to clinic and comfort with self-management, patient and DPT agree that discharge is appropriate at this time. Patient may benefit from continued skilled therapeutic intervention to address any returning deficits in deep core strength, IAP management, posture, and pain in order to increase function and improve overall QOL. ? ? ? PT Long Term Goals - 02/06/22 1637   ? ?  ? PT LONG TERM GOAL #1  ? Title Patient will demonstrate independence with HEP in order to maximize therapeutic gains and improve carryover from physical therapy sessions to ADLs in the home and community.   ? Baseline IE: Access Code [email or web address]   ? Time 12   ? Period Weeks   ? Status Achieved   ? Target Date 01/28/22   ?  ? PT LONG TERM GOAL #2  ? Title Patient will demonstrate improved function as evidenced by a score of <10 on FOTO measure for full participation in activities at home and in the community.   ? Baseline IE: 25; 2/28: 17; 3/30: 13   ? Time 12   ? Period Weeks   ? Status Partially Met   ? Target Date 01/28/22   ?  ? PT LONG TERM GOAL #3  ? Title Patient will decrease  worst pain as reported on NPRS by at least 2 points to demonstrate clinically significant reduction in pain in order to restore/improve function and overall QOL.   ? Baseline IE: 5/10 suprapubic; 2/28: 1-2/10; 3/30: 2-3/10   ? Time 12   ? Period Weeks   ? Status Partially Met   ? Target Date 01/28/22   ?  ? PT LONG TERM GOAL #4  ? Title Patient will have reduced DRAM inferior to umbilicus to < 2 finger breadth in order to demonstrate improved IAP management and participate fully in activities at home and in the community absent of pain/limitation.   ? Baseline IE: 4 finger breadth inferior to umbilicus; 3/81: < 2 finger throughout, depth to DIP inferior; 3/30: 1  finger superior and inferior, < 2 finger at umbilicus, no considerable depth throughout   ? Time 12   ? Period Weeks   ? Status Achieved   ? Target Date 01/28/22   ? ?  ?  ? ?  ? ? ? ? ? ? ? ? Plan - 02/06/22 1640   ? ? Clinical Impression Statement Patient presents to clinic with excellent motivation to participate in therapy. Patient demonstrates minimal deficits in deep core strength, IAP management, posture, and pain. Patient continuing to progress toward goals and has demonstrated competence with self-management over the past 4 weeks. Due to patient's long commute to clinic and comfort with self-management, patient and DPT agree that discharge is appropriate at this time. Patient may benefit from continued skilled therapeutic intervention to address any returning deficits in deep core strength, IAP management, posture, and pain in order to increase function and improve overall QOL.   ? Personal Factors and Comorbidities Behavior Pattern;Past/Current Experience   ? Examination-Activity Limitations Bed Mobility;Transfers;Squat;Lift;Carry;Reach Overhead;Caring for Others   ? Examination-Participation Restrictions Community Activity;Occupation;Interpersonal Relationship;Laundry;Shop;Yard Work;Cleaning   ? Stability/Clinical Decision Making Evolving/Moderate complexity   ? Rehab Potential Good   ? PT Frequency One time visit   ? PT Treatment/Interventions Cryotherapy;Moist Heat;Electrical Stimulation;Therapeutic exercise;Patient/family education;Neuromuscular re-education;Manual techniques;Taping;Spinal Manipulations;Joint Manipulations;Orthotic Fit/Training   ? PT Next Visit Plan deep core in quadruped, standing/counter, supine   ? PT Home Exercise Plan [id number]   ? Consulted and Agree with Plan of Care Patient   ? ?  ?  ? ?  ? ? ?Patient will benefit from skilled therapeutic intervention in order to improve the following deficits and impairments:  Pain, Postural dysfunction, Decreased strength, Decreased  coordination, Decreased activity tolerance, Improper body mechanics, Decreased endurance ? ?Visit Diagnosis: ?Muscle weakness (generalized) ? ?Other lack of coordination ? ?Abnormal posture ? ? ? ? ?Problem List ?Patient Active Problem List  ? Diagnosis Date Noted  ? 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  ? History of preterm delivery 06/17/2018  ? History of heavy periods 01/02/2017  ? Dysmenorrhea 01/02/2017  ? Family history of endometriosis in first degree relative 01/02/2017  ? Family history of first-degree relative with cardiomyopathy 04/16/2016  ? ? ?Myles Gip PT, DPT [phone]  ?02/06/2022, 5:35 PM ? ?[place] ?[facility] ?[address] [place], [place], [address] ?Phone: [phone]   Fax:  [phone] ? ?Name: Bonnie Graham ?MRN: [id number] ?Date of Birth: 1996/04/17 ? ? ? ?

## 2022-05-05 ENCOUNTER — Encounter: Payer: Medicaid Other | Admitting: Certified Nurse Midwife

## 2022-11-10 NOTE — L&D Delivery Note (Signed)
   Delivery Note:   [id number] at [redacted]w[redacted]d  Admitting diagnosis: Encounter for induction of labor [Z34.90] Risks:  Patient Active Problem List   Diagnosis Date Noted   Encounter for induction of labor 07/01/2023   Anemia 05/01/2023   IUGR (intrauterine growth restriction) affecting care of mother 04/27/2023   Supervision of high risk pregnancy, antepartum, third trimester 12/17/2022   History of preterm delivery 06/17/2018     First Stage:  Induction of labor: 07/01/23 Onset of labor: @ 1043 Augmentation: AROM and Pitocin ROM: AROM @ 1043 Active labor onset: @ 1043 Analgesia /Anesthesia/Pain control intrapartum: Epidural  Second Stage:  Complete dilation at 07/01/2023 @ 1150 Onset of pushing at 12:00 FHR second stage Cat I    CNM called to patient bedside. Patient Pushing in left side lying position with CNM and L&D staff support at bedside. FOB "alex" present for birth and supportive.   Delivery of a Live born female  Birth Weight:  PENDING  APGAR: 9,9   Newborn Delivery   Birth date/time: 07/01/2023 12:07:56 Delivery type: Vaginal, Spontaneous    With great maternal pushing efforts, Fetal head delivered in cephalic presentation, DOA position and spontaneously restituted to ROP. Remaining fetal body delivered with ease. Vigorously crying infant placed immediately skin to skin with mom.   Nuchal Cord: No    After several mins of life cord double clamped after cessation of pulsation, and cut by ALEX .  Collection of cord blood for typing completed. Cord blood donation-None Arterial cord blood sample-No   Third Stage:  With gently cord traction and LUS massage Placenta delivered-Spontaneous intact with 3 vessels. Uterine tone firm bleeding scant Uterotonics: IV pit bolus initiated  Placenta to L&D for disposal .    Hemostatic Perineal abrasion laceration identified.  Episiotomy:None Local analgesia: N/A   Repair:N/A  Est. Blood Loss (mL):107 Complications:  None   Mom to postpartum.  Baby boy "[name]" to Couplet care / Skin to Skin.  Delivery Report:  Review the Delivery Report for details.    Christiano Blandon Danella Deis) Suzie Portela, MSN, CNM  Center for [facility]  07/01/23 12:31 PM

## 2022-11-13 IMAGING — US US OB COMP LESS 14 WK
1 series · 14 of 28 positions shown · non-contrast
Comparison: Ultrasound from prior pregnancy 07/26/2019

CLINICAL DATA: Dating

EXAM:
OBSTETRIC <14 WK ULTRASOUND
TECHNIQUE: Transabdominal ultrasound was performed for evaluation of the
gestation as well as the maternal uterus and adnexal regions.

[Series 1: us ob comp less 14 wk · 0.13mm/px · 14 of 36 slices shown]
[im 2/36]
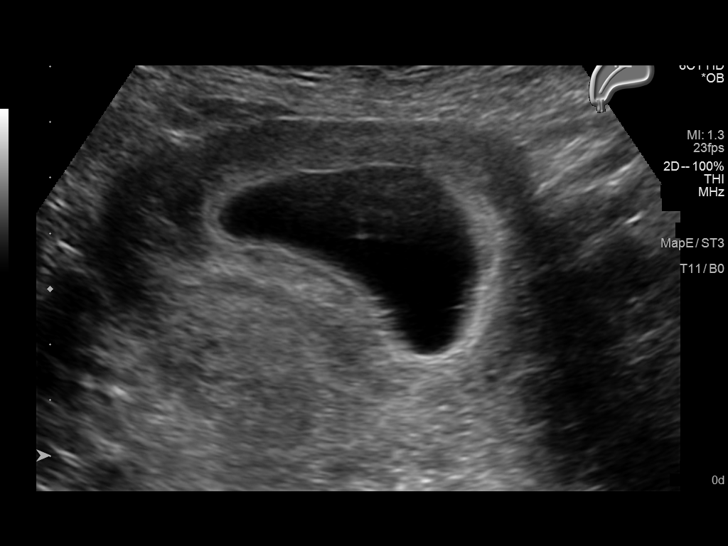
[im 4/36]
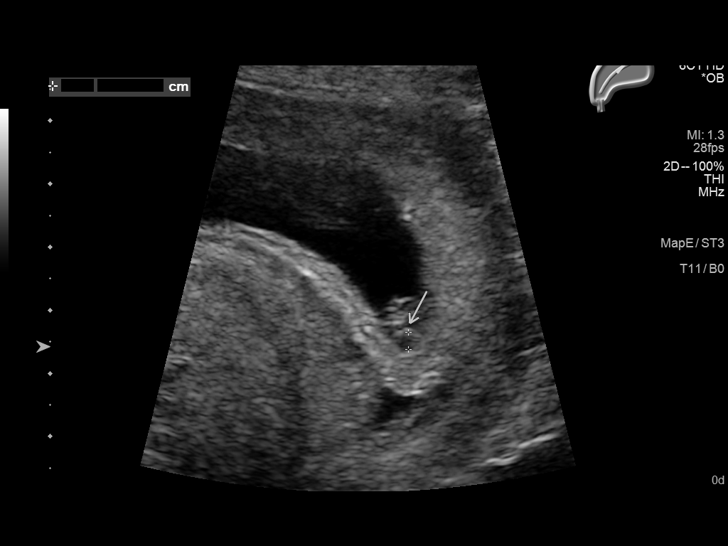
[im 7/36]
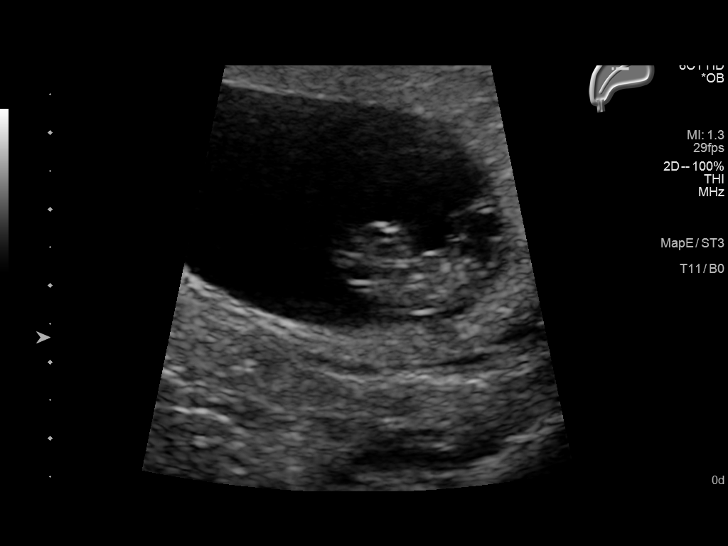
[im 10/36]
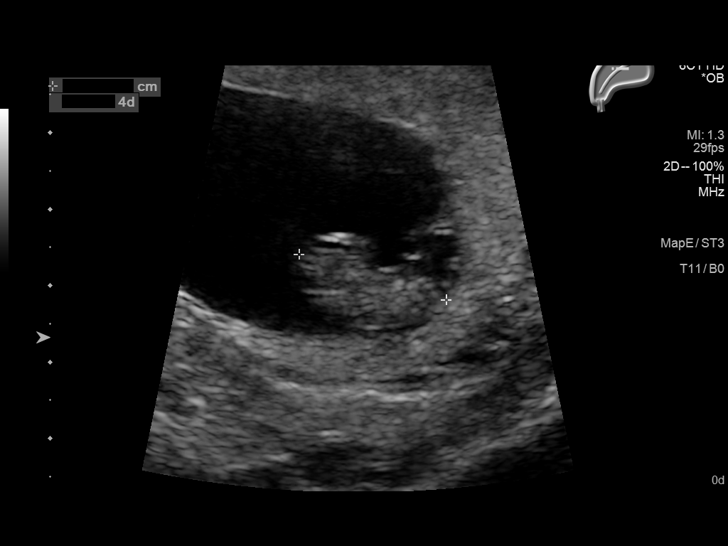
[im 12/36]
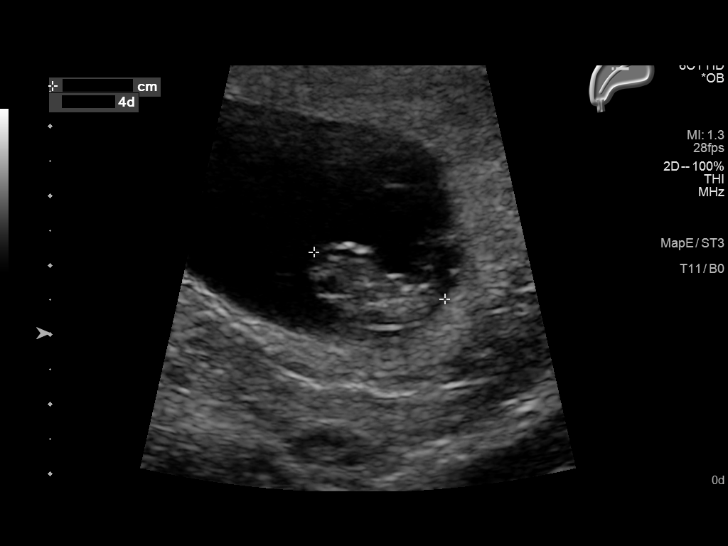
[im 15/36]
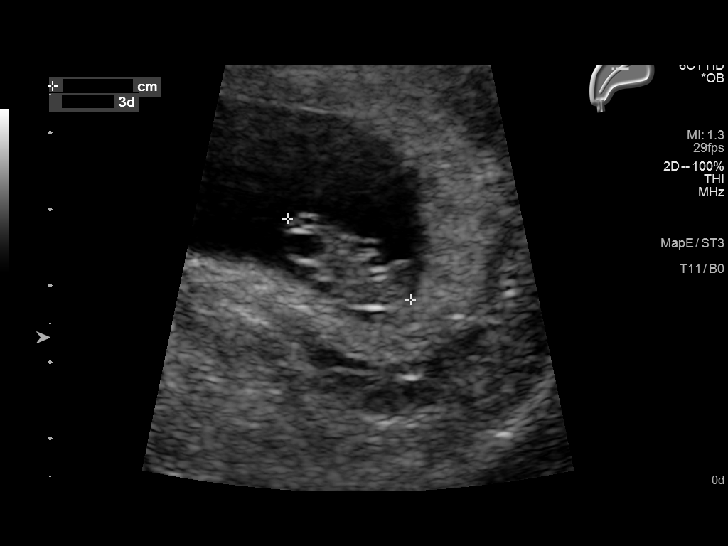
[im 17/36]
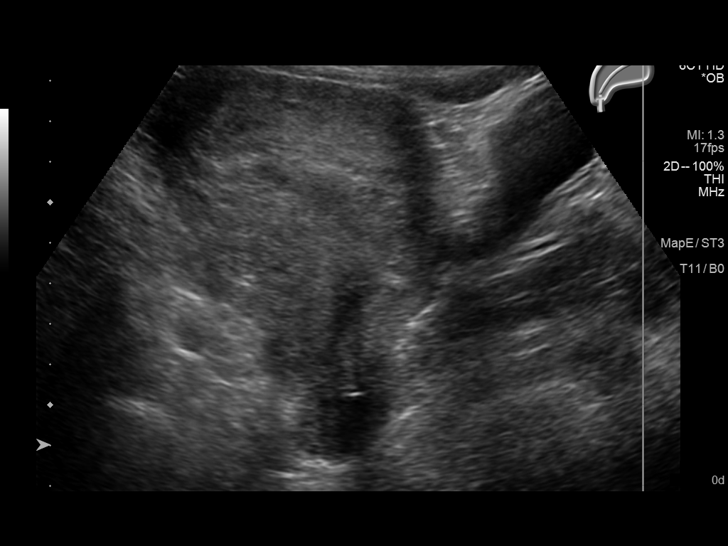
[im 20/36]
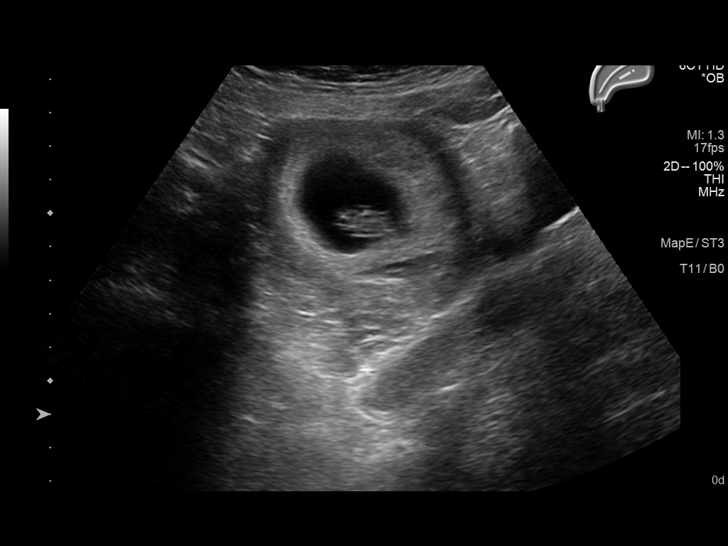
[im 23/36]
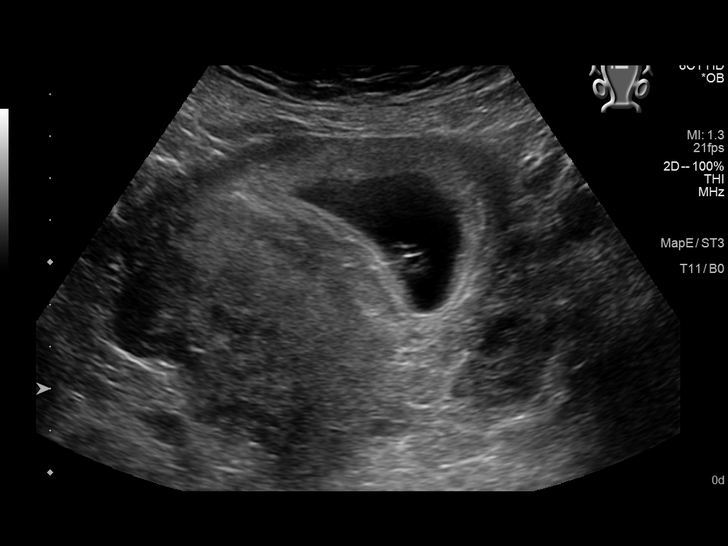
[im 25/36]
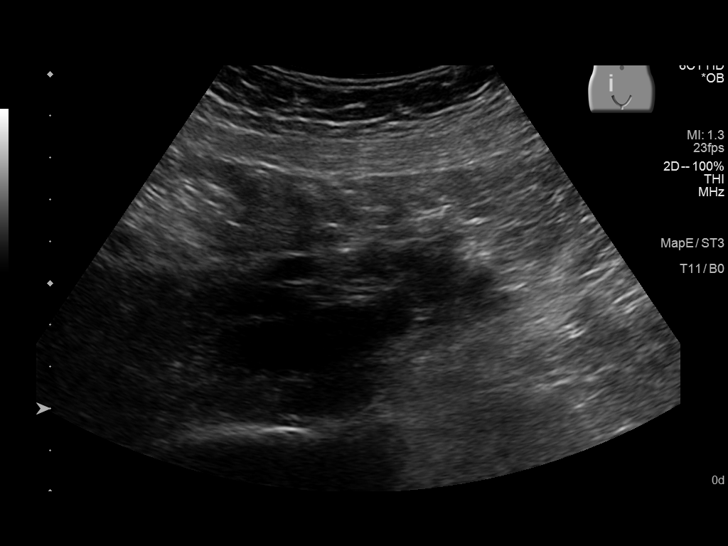
[im 28/36]
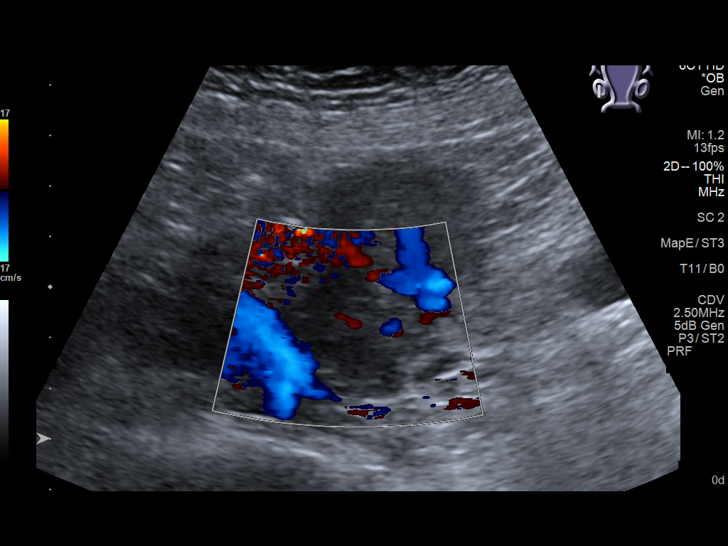
[im 30/36]
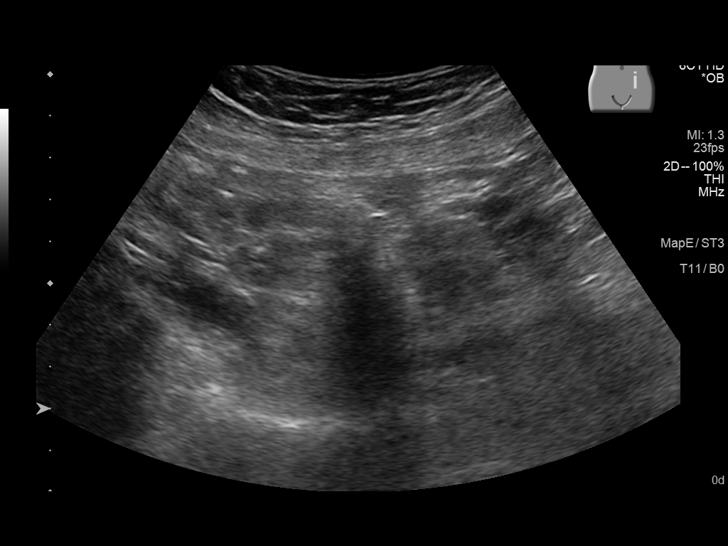
[im 33/36]
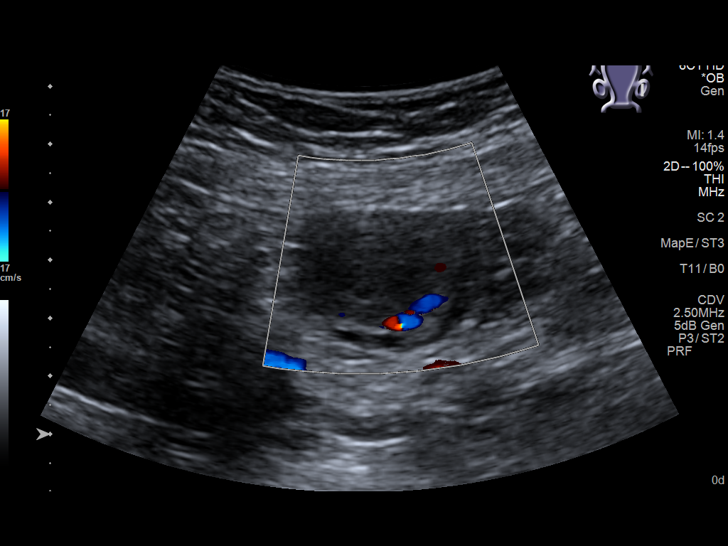
[im 36/36]
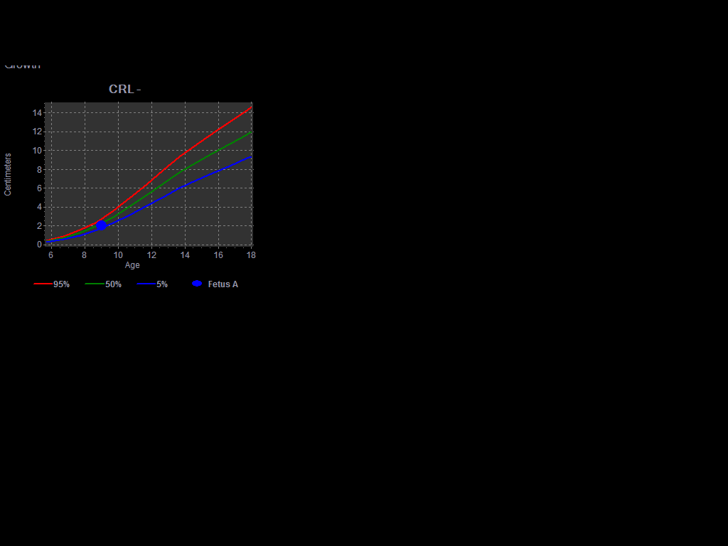

[14 of 28 positions shown; findings below may reference images not displayed]

FINDINGS: Intrauterine gestational sac: Single

Yolk sac:  Visualized.

Embryo:  Visualized.

Cardiac Activity: Visualized.

Heart Rate: 157 bpm

CRL: 20.1 mm   8 w 4 d                  US EDC: 10/18/2021

Subchorionic hemorrhage:  None visualized.

Maternal uterus/adnexae: Normal appearance of the maternal uterus.
Corpus luteum seen in the left ovary. No concerning adnexal
abnormalities. No free fluid.
IMPRESSION: Single viable intrauterine gestation at 8 weeks, 4 days by
crown-rump length sonographic estimation.

No acute sonographic complication.

## 2022-12-17 ENCOUNTER — Encounter: Payer: Self-pay | Admitting: *Deleted

## 2022-12-17 DIAGNOSIS — Z3490 Encounter for supervision of normal pregnancy, unspecified, unspecified trimester: Secondary | ICD-10-CM | POA: Insufficient documentation

## 2022-12-17 DIAGNOSIS — O0993 Supervision of high risk pregnancy, unspecified, third trimester: Secondary | ICD-10-CM | POA: Insufficient documentation

## 2022-12-26 ENCOUNTER — Encounter: Payer: Self-pay | Admitting: Certified Nurse Midwife

## 2022-12-26 ENCOUNTER — Ambulatory Visit (INDEPENDENT_AMBULATORY_CARE_PROVIDER_SITE_OTHER): Payer: Medicaid Other | Admitting: Certified Nurse Midwife

## 2022-12-26 VITALS — BP 109/70 | HR 71 | Wt 155.0 lb

## 2022-12-26 DIAGNOSIS — Z3481 Encounter for supervision of other normal pregnancy, first trimester: Secondary | ICD-10-CM

## 2022-12-26 DIAGNOSIS — Z8751 Personal history of pre-term labor: Secondary | ICD-10-CM | POA: Diagnosis not present

## 2022-12-26 DIAGNOSIS — Z3490 Encounter for supervision of normal pregnancy, unspecified, unspecified trimester: Secondary | ICD-10-CM

## 2022-12-26 DIAGNOSIS — O26899 Other specified pregnancy related conditions, unspecified trimester: Secondary | ICD-10-CM

## 2022-12-26 DIAGNOSIS — R102 Pelvic and perineal pain unspecified side: Secondary | ICD-10-CM

## 2022-12-26 DIAGNOSIS — Z3A10 10 weeks gestation of pregnancy: Secondary | ICD-10-CM | POA: Diagnosis not present

## 2022-12-26 NOTE — Progress Notes (Unsigned)
Bedside U/S shows an active single IUP with CRL measuring 27.23m  [id number]

## 2022-12-26 NOTE — Progress Notes (Signed)
Last pap- 03/13/21- negative

## 2022-12-27 ENCOUNTER — Encounter: Payer: Self-pay | Admitting: Certified Nurse Midwife

## 2022-12-27 NOTE — Progress Notes (Signed)
Subjective:   Bonnie Graham is a 27 y.o. [phone] at 49w3dby LMP being seen today for her first obstetrical visit.  Her obstetrical history is significant for  preterm birth x3 . Patient does intend to breast feed. Pregnancy history fully reviewed. Reports increased pelvic pressure and heaviness. Had PT during her last pregnancy for pelvic pressure (thought she may have had prolapse).  Patient reports no complaints.  HISTORY: OB History  Gravida Para Term Preterm AB Living  4 3 0 3 0 3  SAB IAB Ectopic Multiple Live Births  0 0 0 0 3    # Outcome Date GA Lbr Len/2nd Weight Sex Delivery Anes PTL Lv  4 Current           3 Preterm 09/17/21 380w0d5 lb 12.1 oz (2.61 kg) M Vag-Spont   LIV  2 Preterm 11/13/19 3665w6d lb 11 oz (2.58 kg) M Vag-Spont  Y LIV     Name: RowMayer Camel Preterm 05/08/18 33w81w0dlb 12 oz (1.7 kg) M Vag-Spont EPI N LIV     Birth Comments: PTL     Complications: Premature delivery     Name: DeclProgramme researcher, broadcasting/film/videoast Medical History:  Diagnosis Date   Heavy periods    Painful menstrual periods    Premature delivery before 37 weeks    Past Surgical History:  Procedure Laterality Date   NO PAST SURGERIES     Family History  Problem Relation Age of Onset   Heart disease Father        genetic cardiomyopathy   Endometriosis Sister    Cancer Maternal Grandmother        pancreatic   Endometriosis Maternal Grandmother        grt   Breast cancer Neg Hx    Ovarian cancer Neg Hx    Colon cancer Neg Hx    Diabetes Neg Hx    Social History   Tobacco Use   Smoking status: Never   Smokeless tobacco: Never  Vaping Use   Vaping Use: Never used  Substance Use Topics   Alcohol use: No   Drug use: No   No Known Allergies Current Outpatient Medications on File Prior to Visit  Medication Sig Dispense Refill   Prenatal Vit-Fe Sulfate-FA-DHA (PRENATAL VITAMIN/MIN +DHA) 27-0.8-200 MG CAPS Take 1 tablet by mouth daily. 30 capsule 11   ibuprofen (ADVIL) 600 MG tablet  Take 1 tablet (600 mg total) by mouth every 6 (six) hours. (Patient not taking: Reported on 12/26/2022) 30 tablet 0   Magnesium Oxide 400 MG CAPS Take 1 capsule (400 mg total) by mouth daily. (Patient not taking: Reported on 12/26/2022) 30 capsule 4   miconazole (MICOTIN) 2 % cream Apply 1 application topically 2 (two) times daily. Apply to nipple after each feed. Wipe any residue off prior to feeding with coconut oil or olive oil. (Patient not taking: Reported on 12/26/2022) 28.35 g 0   No current facility-administered medications on file prior to visit.    Indications for ASA therapy (per uptodate) One of the following: Previous pregnancy with preeclampsia, especially early onset and with an adverse outcome No Multifetal gestation No Chronic hypertension No Type 1 or 2 diabetes mellitus No Chronic kidney disease No Autoimmune disease (antiphospholipid syndrome, systemic lupus erythematosus) No  Two or more of the following: Nulliparity No Obesity (body mass index >30 kg/m2) No Family history of preeclampsia in mother or sister No Age ?35 years  No Sociodemographic characteristics (African American race, low socioeconomic level) No Personal risk factors (eg, previous pregnancy with low birth weight or small for gestational age infant, previous adverse pregnancy outcome [eg, stillbirth], interval >10 years between pregnancies) No  Indications for early 1 hour GTT (per uptodate)  BMI >25 (>23 in Asian women) AND one of the following  Gestational diabetes mellitus in a previous pregnancy No Glycated hemoglobin ?5.7 percent (39 mmol/mol), impaired glucose tolerance, or impaired fasting glucose on previous testing No First-degree relative with diabetes No High-risk race/ethnicity (eg, African American, Latino, Native American, [place] American, Pacific Islander) No History of cardiovascular disease No Hypertension or on therapy for hypertension No High-density lipoprotein cholesterol level  <35 mg/dL (0.90 mmol/L) and/or a triglyceride level >250 mg/dL (2.82 mmol/L) No Polycystic ovary syndrome No Physical inactivity No Other clinical condition associated with insulin resistance (eg, severe obesity, acanthosis nigricans) No Previous birth of an infant weighing ?4000 g No Previous stillbirth of unknown cause No   Exam   Vitals:   12/26/22 0916  BP: 109/70  Pulse: 71  Weight: 155 lb (70.3 kg)   Fetal Heart Rate (bpm): 168  Uterus:     Pelvic Exam: Perineum: deferred   Vulva:    Vagina:     Cervix:    Adnexa:    Bony Pelvis:   System: General: well-developed, well-nourished female in no acute distress   Breast:  normal appearance, no masses or tenderness   Skin: normal coloration and turgor, no rashes   Neurologic: oriented, normal, negative, normal mood   Extremities: normal strength, tone, and muscle mass, ROM of all joints is normal   HEENT PERRLA, extraocular movement intact and sclera clear, anicteric   Mouth/Teeth mucous membranes moist, pharynx normal without lesions and dental hygiene good   Neck supple and no masses   Cardiovascular: regular rate and rhythm   Respiratory:  no respiratory distress, normal breath sounds   Abdomen: soft, non-tender; bowel sounds normal; no masses,  no organomegaly     Assessment:   Pregnancy: [id number] Patient Active Problem List   Diagnosis Date Noted   Supervision of normal pregnancy 12/17/2022   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 Encounter for supervision of normal pregnancy, antepartum, unspecified gravidity - Pregnancy, Initial Screen - Enroll Patient in PreNatal 4 - [place] MFM OB DETAIL +14 WK; Future  2. [redacted] weeks gestation of pregnancy  3.  History of preterm delivery -pt asking about Levonne Spiller, had with her last 2 pregnancies -informed no longer recommend, found not to be beneficial  4. Pelvic pressure - ?hx of prolapse - referral to pelvic PT   Initial labs drawn. Early 1 hour GTT n/a Started on ASA -no Flu vax n/a Continue prenatal vitamins. Genetic Screening discussed, Quad screen and NIPS: requested. Ultrasound discussed; fetal anatomic survey: requested. Problem list reviewed and updated The nature of [place] [facility] with multiple MDs and other Advanced Practice Providers was explained to patient; also emphasized that fellows, residents, and students are part of our team. Routine obstetric precautions reviewed Return in about 4 weeks (around 01/23/2023).   Julianne Handler, CNM 8:16 AM 12/27/22

## 2022-12-31 LAB — PREGNANCY, INITIAL SCREEN
Antibody Screen: NEGATIVE
Basophils Absolute: 0 10*3/uL (ref 0.0–0.2)
Basos: 0 %
Bilirubin, UA: NEGATIVE
Chlamydia trachomatis, NAA: NEGATIVE
EOS (ABSOLUTE): 0.2 10*3/uL (ref 0.0–0.4)
Eos: 2 %
Glucose, UA: NEGATIVE
HCV Ab: NONREACTIVE
HIV Screen 4th Generation wRfx: NONREACTIVE
Hematocrit: 40.9 % (ref 34.0–46.6)
Hemoglobin: 13.7 g/dL (ref 11.1–15.9)
Hepatitis B Surface Ag: NEGATIVE
Immature Grans (Abs): 0.1 10*3/uL (ref 0.0–0.1)
Immature Granulocytes: 1 %
Ketones, UA: NEGATIVE
Leukocytes,UA: NEGATIVE
Lymphocytes Absolute: 1.6 10*3/uL (ref 0.7–3.1)
Lymphs: 16 %
MCH: 28.9 pg (ref 26.6–33.0)
MCHC: 33.5 g/dL (ref 31.5–35.7)
MCV: 86 fL (ref 79–97)
Monocytes Absolute: 0.6 10*3/uL (ref 0.1–0.9)
Monocytes: 6 %
Neisseria Gonorrhoeae by PCR: NEGATIVE
Neutrophils Absolute: 7.5 10*3/uL — ABNORMAL HIGH (ref 1.4–7.0)
Neutrophils: 75 %
Nitrite, UA: NEGATIVE
Platelets: 355 10*3/uL (ref 150–450)
RBC, UA: NEGATIVE
RBC: 4.74 x10E6/uL (ref 3.77–5.28)
RDW: 13.3 % (ref 11.7–15.4)
RPR Ser Ql: NONREACTIVE
Rh Factor: POSITIVE
Rubella Antibodies, IGG: 1.03 index (ref >0.99)
Specific Gravity, UA: >=1.03 — AB (ref 1.005–1.030)
Urobilinogen, Ur: 0.2 mg/dL (ref 0.2–1.0)
WBC: 9.9 10*3/uL (ref 3.4–10.8)
pH, UA: 7 (ref 5.0–7.5)

## 2022-12-31 LAB — URINE CULTURE, OB REFLEX

## 2022-12-31 LAB — MICROSCOPIC EXAMINATION
Bacteria, UA: NONE SEEN
Casts: NONE SEEN /lpf
WBC, UA: NONE SEEN /hpf (ref 0–5)

## 2022-12-31 LAB — HCV INTERPRETATION

## 2023-01-30 ENCOUNTER — Ambulatory Visit (INDEPENDENT_AMBULATORY_CARE_PROVIDER_SITE_OTHER): Payer: Medicaid Other | Admitting: Certified Nurse Midwife

## 2023-01-30 VITALS — BP 109/58 | HR 86 | Wt 157.0 lb

## 2023-01-30 DIAGNOSIS — Z3492 Encounter for supervision of normal pregnancy, unspecified, second trimester: Secondary | ICD-10-CM

## 2023-01-30 DIAGNOSIS — Z8751 Personal history of pre-term labor: Secondary | ICD-10-CM

## 2023-01-30 DIAGNOSIS — Z3482 Encounter for supervision of other normal pregnancy, second trimester: Secondary | ICD-10-CM

## 2023-01-30 DIAGNOSIS — Z3A15 15 weeks gestation of pregnancy: Secondary | ICD-10-CM

## 2023-01-30 NOTE — Progress Notes (Signed)
Subjective:  Bonnie Graham is a 27 y.o. [phone] at [redacted]w[redacted]d being seen today for ongoing prenatal care.  She is currently monitored for the following issues for this low-risk pregnancy and has History of heavy periods; Dysmenorrhea; Family history of endometriosis in first degree relative; History of preterm delivery; Postpartum anxiety; Family history of first-degree relative with cardiomyopathy; and Supervision of normal pregnancy on their problem list.  Patient reports no complaints.  Contractions: Not present. Vag. Bleeding: None.   . Denies leaking of fluid.   The following portions of the patient's history were reviewed and updated as appropriate: allergies, current medications, past family history, past medical history, past social history, past surgical history and problem list. Problem list updated.  Objective:   Vitals:   01/30/23 1045  BP: (!) 109/58  Pulse: 86  Weight: 157 lb (71.2 kg)    Fetal Status: Fetal Heart Rate (bpm): 152 Fundal Height: 15 cm       General:  Alert, oriented and cooperative. Patient is in no acute distress.  Skin: Skin is warm and dry. No rash noted.   Cardiovascular: Normal heart rate noted  Respiratory: Normal respiratory effort, no problems with respiration noted  Abdomen: Soft, gravid, appropriate for gestational age. Pain/Pressure: Absent     Pelvic: Vag. Bleeding: None           Extremities: Normal range of motion.  Edema: None  Mental Status: Normal mood and affect. Normal behavior. Normal judgment and thought content.   Urinalysis:      Assessment and Plan:  Pregnancy: [id number] at [redacted]w[redacted]d  1. Encounter for supervision of normal pregnancy in second trimester, unspecified gravidity  2. History of preterm delivery - discussed Makena no longer recommended - pt understands it's not recommended and newest data doesn't support benefit or improved outcomes but would feel better using it as she did in last 2 pregnancies - will explore ability to  obtain [facility] and let pt decided how to proceed  3. [redacted] weeks gestation of pregnancy  Preterm labor symptoms and general obstetric precautions including but not limited to vaginal bleeding, contractions, leaking of fluid and fetal movement were reviewed in detail with the patient. Please refer to After Visit Summary for other counseling recommendations.  Return in about 5 weeks (around 03/06/2023).   Julianne Handler, CNM

## 2023-03-06 ENCOUNTER — Other Ambulatory Visit: Payer: Self-pay | Admitting: *Deleted

## 2023-03-06 ENCOUNTER — Ambulatory Visit: Payer: Medicaid Other | Attending: Certified Nurse Midwife

## 2023-03-06 ENCOUNTER — Encounter: Payer: Self-pay | Admitting: *Deleted

## 2023-03-06 ENCOUNTER — Ambulatory Visit: Payer: Medicaid Other | Admitting: *Deleted

## 2023-03-06 VITALS — BP 113/60 | HR 89

## 2023-03-06 DIAGNOSIS — Z363 Encounter for antenatal screening for malformations: Secondary | ICD-10-CM | POA: Insufficient documentation

## 2023-03-06 DIAGNOSIS — Z362 Encounter for other antenatal screening follow-up: Secondary | ICD-10-CM | POA: Insufficient documentation

## 2023-03-06 DIAGNOSIS — Z3490 Encounter for supervision of normal pregnancy, unspecified, unspecified trimester: Secondary | ICD-10-CM

## 2023-03-06 DIAGNOSIS — O09212 Supervision of pregnancy with history of pre-term labor, second trimester: Secondary | ICD-10-CM | POA: Insufficient documentation

## 2023-03-06 DIAGNOSIS — O358XX0 Maternal care for other (suspected) fetal abnormality and damage, not applicable or unspecified: Secondary | ICD-10-CM | POA: Insufficient documentation

## 2023-03-06 DIAGNOSIS — O09219 Supervision of pregnancy with history of pre-term labor, unspecified trimester: Secondary | ICD-10-CM | POA: Diagnosis not present

## 2023-03-06 DIAGNOSIS — Z3A20 20 weeks gestation of pregnancy: Secondary | ICD-10-CM | POA: Diagnosis not present

## 2023-03-06 DIAGNOSIS — O09899 Supervision of other high risk pregnancies, unspecified trimester: Secondary | ICD-10-CM

## 2023-03-13 ENCOUNTER — Telehealth (INDEPENDENT_AMBULATORY_CARE_PROVIDER_SITE_OTHER): Payer: Medicaid Other

## 2023-03-13 ENCOUNTER — Encounter: Payer: Self-pay | Admitting: Certified Nurse Midwife

## 2023-03-13 VITALS — BP 120/71 | HR 85 | Wt 161.0 lb

## 2023-03-13 DIAGNOSIS — Z3492 Encounter for supervision of normal pregnancy, unspecified, second trimester: Secondary | ICD-10-CM

## 2023-03-13 DIAGNOSIS — O09212 Supervision of pregnancy with history of pre-term labor, second trimester: Secondary | ICD-10-CM

## 2023-03-13 DIAGNOSIS — Z3A21 21 weeks gestation of pregnancy: Secondary | ICD-10-CM

## 2023-03-13 DIAGNOSIS — Z8751 Personal history of pre-term labor: Secondary | ICD-10-CM

## 2023-03-13 NOTE — Progress Notes (Signed)
   OBSTETRICS PRENATAL VIRTUAL VISIT ENCOUNTER NOTE  Provider location: [facility] for [facility] [place]   Patient location: Home  I connected with Bonnie Graham on 03/13/23 at  9:30 AM EDT by MyChart Video Encounter and verified that I am speaking with the correct person using two identifiers. I discussed the limitations, risks, security and privacy concerns of performing an evaluation and management service virtually and the availability of in person appointments. I also discussed with the patient that there may be a patient responsible charge related to this service. The patient expressed understanding and agreed to proceed. Subjective:  Bonnie Graham is a 27 y.o. [phone] at [redacted]w[redacted]d being seen today for ongoing prenatal care.  She is currently monitored for the following issues for this low-risk pregnancy and has History of preterm delivery and Supervision of normal pregnancy on their problem list.  Patient reports no complaints.  Contractions: Not present. Vag. Bleeding: None.  Movement: Present. Denies any leaking of fluid.   The following portions of the patient's history were reviewed and updated as appropriate: allergies, current medications, past family history, past medical history, past social history, past surgical history and problem list.   Objective:   Vitals:   03/13/23 0931  BP: 120/71  Pulse: 85  Weight: 161 lb (73 kg)    Fetal Status:     Movement: Present     General:  Alert, oriented and cooperative. Patient is in no acute distress.  Respiratory: Normal respiratory effort, no problems with respiration noted  Mental Status: Normal mood and affect. Normal behavior. Normal judgment and thought content.  Rest of physical exam deferred due to type of encounter  Imaging:  Assessment and Plan:  Pregnancy: [id number] at [redacted]w[redacted]d 1. Encounter for supervision of normal pregnancy in second trimester, unspecified gravidity - Routine OB. Doing well, no  concerns - Anticipatory guidance for upcoming appointments provided. GTT and labs next visit  2. [redacted] weeks gestation of pregnancy - Endorses active fetal movement  3. History of preterm delivery   Preterm labor symptoms and general obstetric precautions including but not limited to vaginal bleeding, contractions, leaking of fluid and fetal movement were reviewed in detail with the patient. I discussed the assessment and treatment plan with the patient. The patient was provided an opportunity to ask questions and all were answered. The patient agreed with the plan and demonstrated an understanding of the instructions. The patient was advised to call back or seek an in-person office evaluation/go to MAU at [facility] for any urgent or concerning symptoms. Please refer to After Visit Summary for other counseling recommendations.   I provided 10 minutes of face-to-face time during this encounter.  No follow-ups on file.  Future Appointments  Date Time Provider Department Center  04/10/2023  3:15 PM [facility] NURSE [facility]  04/10/2023  3:30 PM [facility] US2 [facility]  04/30/2023  8:50 AM Lennart Pall, MD [facility]    Brand Males, CNM Center for [employer], [facility]

## 2023-03-13 NOTE — Progress Notes (Signed)
Virtual Visit via Telephone Note  I connected with Bonnie Graham on 03/13/23 at  9:30 AM EDT by telephone and verified that I am speaking with the correct person using two identifiers.  Location: Patient: Home Provider: [facility]

## 2023-04-10 ENCOUNTER — Other Ambulatory Visit: Payer: Self-pay | Admitting: Obstetrics

## 2023-04-10 ENCOUNTER — Ambulatory Visit: Payer: Medicaid Other | Admitting: *Deleted

## 2023-04-10 ENCOUNTER — Ambulatory Visit: Payer: Medicaid Other | Attending: Obstetrics

## 2023-04-10 VITALS — BP 114/63 | HR 82

## 2023-04-10 DIAGNOSIS — Z3A25 25 weeks gestation of pregnancy: Secondary | ICD-10-CM

## 2023-04-10 DIAGNOSIS — O09899 Supervision of other high risk pregnancies, unspecified trimester: Secondary | ICD-10-CM

## 2023-04-10 DIAGNOSIS — Z362 Encounter for other antenatal screening follow-up: Secondary | ICD-10-CM

## 2023-04-10 DIAGNOSIS — O365920 Maternal care for other known or suspected poor fetal growth, second trimester, not applicable or unspecified: Secondary | ICD-10-CM

## 2023-04-10 DIAGNOSIS — O09212 Supervision of pregnancy with history of pre-term labor, second trimester: Secondary | ICD-10-CM

## 2023-04-13 ENCOUNTER — Other Ambulatory Visit: Payer: Self-pay | Admitting: *Deleted

## 2023-04-13 DIAGNOSIS — O09899 Supervision of other high risk pregnancies, unspecified trimester: Secondary | ICD-10-CM

## 2023-04-13 DIAGNOSIS — O365920 Maternal care for other known or suspected poor fetal growth, second trimester, not applicable or unspecified: Secondary | ICD-10-CM

## 2023-04-14 ENCOUNTER — Telehealth: Payer: Self-pay

## 2023-04-14 NOTE — Telephone Encounter (Signed)
Called to schedule ultrasound appointments 2 weeks and 3 weeks out from 04/10/23.  Patient not available.  Left message for patient to call back to schedule.  Appointments available 04/23/23 and 04/30/23.

## 2023-04-22 ENCOUNTER — Encounter: Payer: Self-pay | Admitting: Certified Nurse Midwife

## 2023-04-24 ENCOUNTER — Ambulatory Visit: Payer: Medicaid Other | Admitting: *Deleted

## 2023-04-24 ENCOUNTER — Ambulatory Visit: Payer: Medicaid Other | Attending: Obstetrics

## 2023-04-24 ENCOUNTER — Other Ambulatory Visit: Payer: Self-pay | Admitting: *Deleted

## 2023-04-24 VITALS — BP 121/66 | HR 80

## 2023-04-24 DIAGNOSIS — Z3A27 27 weeks gestation of pregnancy: Secondary | ICD-10-CM

## 2023-04-24 DIAGNOSIS — O365920 Maternal care for other known or suspected poor fetal growth, second trimester, not applicable or unspecified: Secondary | ICD-10-CM | POA: Diagnosis present

## 2023-04-24 DIAGNOSIS — O09899 Supervision of other high risk pregnancies, unspecified trimester: Secondary | ICD-10-CM | POA: Insufficient documentation

## 2023-04-24 DIAGNOSIS — O09212 Supervision of pregnancy with history of pre-term labor, second trimester: Secondary | ICD-10-CM | POA: Diagnosis not present

## 2023-04-24 DIAGNOSIS — O365990 Maternal care for other known or suspected poor fetal growth, unspecified trimester, not applicable or unspecified: Secondary | ICD-10-CM

## 2023-04-27 DIAGNOSIS — Z87898 Personal history of other specified conditions: Secondary | ICD-10-CM | POA: Insufficient documentation

## 2023-04-27 DIAGNOSIS — O365990 Maternal care for other known or suspected poor fetal growth, unspecified trimester, not applicable or unspecified: Secondary | ICD-10-CM | POA: Insufficient documentation

## 2023-04-30 ENCOUNTER — Ambulatory Visit: Payer: Medicaid Other

## 2023-04-30 ENCOUNTER — Ambulatory Visit (INDEPENDENT_AMBULATORY_CARE_PROVIDER_SITE_OTHER): Payer: Medicaid Other | Admitting: Obstetrics and Gynecology

## 2023-04-30 ENCOUNTER — Other Ambulatory Visit: Payer: Self-pay | Admitting: *Deleted

## 2023-04-30 ENCOUNTER — Other Ambulatory Visit: Payer: Self-pay | Admitting: Obstetrics and Gynecology

## 2023-04-30 ENCOUNTER — Ambulatory Visit: Payer: Medicaid Other | Admitting: *Deleted

## 2023-04-30 ENCOUNTER — Ambulatory Visit: Payer: Medicaid Other | Attending: Obstetrics

## 2023-04-30 VITALS — BP 133/57 | HR 86

## 2023-04-30 VITALS — BP 117/75 | HR 98 | Wt 174.0 lb

## 2023-04-30 DIAGNOSIS — O365920 Maternal care for other known or suspected poor fetal growth, second trimester, not applicable or unspecified: Secondary | ICD-10-CM | POA: Diagnosis present

## 2023-04-30 DIAGNOSIS — O365930 Maternal care for other known or suspected poor fetal growth, third trimester, not applicable or unspecified: Secondary | ICD-10-CM | POA: Diagnosis not present

## 2023-04-30 DIAGNOSIS — O09899 Supervision of other high risk pregnancies, unspecified trimester: Secondary | ICD-10-CM | POA: Diagnosis present

## 2023-04-30 DIAGNOSIS — Z1339 Encounter for screening examination for other mental health and behavioral disorders: Secondary | ICD-10-CM

## 2023-04-30 DIAGNOSIS — O09213 Supervision of pregnancy with history of pre-term labor, third trimester: Secondary | ICD-10-CM | POA: Diagnosis not present

## 2023-04-30 DIAGNOSIS — Z3A28 28 weeks gestation of pregnancy: Secondary | ICD-10-CM

## 2023-04-30 DIAGNOSIS — Z8751 Personal history of pre-term labor: Secondary | ICD-10-CM

## 2023-04-30 DIAGNOSIS — Z3492 Encounter for supervision of normal pregnancy, unspecified, second trimester: Secondary | ICD-10-CM

## 2023-04-30 NOTE — Progress Notes (Signed)
   PRENATAL VISIT NOTE  Subjective:  Tyanna Weeda is a 27 y.o. [phone] at 101w1d being seen today for ongoing prenatal care.  She is currently monitored for the following issues for this high-risk pregnancy and has History of preterm delivery; Supervision of normal pregnancy; and IUGR (intrauterine growth restriction) affecting care of mother on their problem list.  Patient reports doing well overall. Having some lightheadedness in the AM when she is up and moving. Gets better with rest & hydration.  Contractions: Not present. Vag. Bleeding: None.  Movement: Present. Denies leaking of fluid.   The following portions of the patient's history were reviewed and updated as appropriate: allergies, current medications, past family history, past medical history, past social history, past surgical history and problem list.   Objective:   Vitals:   04/30/23 0844  BP: 117/75  Pulse: 98  Weight: 174 lb (78.9 kg)    Fetal Status: Fetal Heart Rate (bpm): 161   Movement: Present     General:  Alert, oriented and cooperative. Patient is in no acute distress.  Skin: Skin is warm and dry. No rash noted.   Cardiovascular: Normal heart rate noted  Respiratory: Normal respiratory effort, no problems with respiration noted  Abdomen: Soft, gravid, appropriate for gestational age.  Pain/Pressure: Present      Assessment and Plan:  Pregnancy: [id number] at [redacted]w[redacted]d 1. Encounter for supervision of normal pregnancy in second trimester, unspecified gravidity 2. [redacted] weeks gestation of pregnancy Declines tdap Plans to breast feed, use condoms for MOC, declines circ & use [facility] in [place] - Glucose Tolerance, 2 Hours w/1 Hour - CBC - HIV antibody (with reflex) - RPR  3. Poor fetal growth affecting management of mother in third trimester, single or unspecified fetus Dx at [redacted]w[redacted]d - 658g (6%), AC 8%, posterior fundal, MVP 6.78, normal UAD Last BPP/UAD 6/14 normal Growth [place] scheduled for today Has  not completed genetic screening. Normotensive.  Antenatal testing/TOD per MFM  4. History of preterm delivery Normal CL on anatomy US  Please refer to After Visit Summary for other counseling recommendations.   Return in about 2 weeks (around 05/14/2023) for return OB at 30 weeks.  Future Appointments  Date Time Provider Department Center  04/30/2023 11:15 AM [facility] NURSE [facility]  04/30/2023 11:30 AM [facility] US2 [facility]  04/30/2023  1:15 PM [facility] NST [facility]  05/19/2023  3:30 PM [facility] NURSE [facility]  05/19/2023  3:45 PM [facility] US1 [facility]   Lennart Pall, MD

## 2023-04-30 NOTE — Addendum Note (Signed)
Addended by: Leola Brazil on: 04/30/2023 09:04 AM   Modules accepted: Orders

## 2023-05-01 ENCOUNTER — Encounter: Payer: Self-pay | Admitting: Obstetrics and Gynecology

## 2023-05-01 DIAGNOSIS — D649 Anemia, unspecified: Secondary | ICD-10-CM | POA: Insufficient documentation

## 2023-05-01 LAB — CBC
Hematocrit: 33.4 % — ABNORMAL LOW (ref 34.0–46.6)
Hemoglobin: 10.8 g/dL — ABNORMAL LOW (ref 11.1–15.9)
MCH: 28.3 pg (ref 26.6–33.0)
MCHC: 32.3 g/dL (ref 31.5–35.7)
MCV: 87 fL (ref 79–97)
Platelets: 307 10*3/uL (ref 150–450)
RBC: 3.82 x10E6/uL (ref 3.77–5.28)
RDW: 13.4 % (ref 11.7–15.4)
WBC: 11.8 10*3/uL — ABNORMAL HIGH (ref 3.4–10.8)

## 2023-05-01 LAB — GLUCOSE TOLERANCE, 1 HOUR: Glucose, 1Hr PP: 94 mg/dL (ref 70–199)

## 2023-05-01 LAB — RPR: RPR Ser Ql: NONREACTIVE

## 2023-05-01 LAB — HIV ANTIBODY (ROUTINE TESTING W REFLEX): HIV Screen 4th Generation wRfx: NONREACTIVE

## 2023-05-01 MED ORDER — FERROUS SULFATE 325 (65 FE) MG PO TABS: 325.0000 mg | ORAL_TABLET | ORAL | 1 refills | Status: AC

## 2023-05-01 NOTE — Addendum Note (Signed)
Addended by: Harvie Bridge on: 05/01/2023 08:07 AM   Modules accepted: Orders

## 2023-05-15 ENCOUNTER — Other Ambulatory Visit: Payer: Self-pay | Admitting: Obstetrics and Gynecology

## 2023-05-15 ENCOUNTER — Ambulatory Visit: Payer: Medicaid Other | Attending: Obstetrics and Gynecology

## 2023-05-15 ENCOUNTER — Ambulatory Visit: Payer: Medicaid Other | Admitting: *Deleted

## 2023-05-15 VITALS — BP 125/66 | HR 90

## 2023-05-15 DIAGNOSIS — O09213 Supervision of pregnancy with history of pre-term labor, third trimester: Secondary | ICD-10-CM

## 2023-05-15 DIAGNOSIS — O365930 Maternal care for other known or suspected poor fetal growth, third trimester, not applicable or unspecified: Secondary | ICD-10-CM | POA: Insufficient documentation

## 2023-05-15 DIAGNOSIS — O09899 Supervision of other high risk pregnancies, unspecified trimester: Secondary | ICD-10-CM

## 2023-05-15 DIAGNOSIS — Z3A30 30 weeks gestation of pregnancy: Secondary | ICD-10-CM

## 2023-05-19 ENCOUNTER — Ambulatory Visit: Payer: Medicaid Other | Attending: Obstetrics and Gynecology

## 2023-05-19 ENCOUNTER — Ambulatory Visit: Payer: Medicaid Other | Admitting: *Deleted

## 2023-05-19 ENCOUNTER — Encounter: Payer: Self-pay | Admitting: *Deleted

## 2023-05-19 ENCOUNTER — Ambulatory Visit (INDEPENDENT_AMBULATORY_CARE_PROVIDER_SITE_OTHER): Payer: Medicaid Other | Admitting: Student

## 2023-05-19 VITALS — BP 118/77 | HR 101 | Wt 175.0 lb

## 2023-05-19 DIAGNOSIS — O365990 Maternal care for other known or suspected poor fetal growth, unspecified trimester, not applicable or unspecified: Secondary | ICD-10-CM | POA: Diagnosis not present

## 2023-05-19 DIAGNOSIS — Z3492 Encounter for supervision of normal pregnancy, unspecified, second trimester: Secondary | ICD-10-CM

## 2023-05-19 DIAGNOSIS — Z3A30 30 weeks gestation of pregnancy: Secondary | ICD-10-CM | POA: Diagnosis not present

## 2023-05-19 DIAGNOSIS — O365930 Maternal care for other known or suspected poor fetal growth, third trimester, not applicable or unspecified: Secondary | ICD-10-CM

## 2023-05-19 DIAGNOSIS — Z8751 Personal history of pre-term labor: Secondary | ICD-10-CM

## 2023-05-19 DIAGNOSIS — O09213 Supervision of pregnancy with history of pre-term labor, third trimester: Secondary | ICD-10-CM

## 2023-05-19 NOTE — Progress Notes (Signed)
   PRENATAL VISIT NOTE  Subjective:  Bonnie Graham is a 27 y.o. [phone] at [redacted]w[redacted]d being seen today for ongoing prenatal care.  She is currently monitored for the following issues for this low-risk pregnancy and has History of preterm delivery; Supervision of normal pregnancy; IUGR (intrauterine growth restriction) affecting care of mother; and Anemia on their problem list.  Patient reports no complaints.  Contractions: Not present. Vag. Bleeding: None.  Movement: Present. Denies leaking of fluid.   The following portions of the patient's history were reviewed and updated as appropriate: allergies, current medications, past family history, past medical history, past social history, past surgical history and problem list.   Objective:   Vitals:   05/19/23 1330  BP: 118/77  Pulse: (!) 101  Weight: 175 lb (79.4 kg)    Fetal Status: Fetal Heart Rate (bpm): 125   Movement: Present     General:  Alert, oriented and cooperative. Patient is in no acute distress.  Skin: Skin is warm and dry. No rash noted.   Cardiovascular: Normal heart rate noted  Respiratory: Normal respiratory effort, no problems with respiration noted  Abdomen: Soft, gravid, appropriate for gestational age.  Pain/Pressure: Absent     Pelvic: Cervical exam deferred        Extremities: Normal range of motion.  Edema: None  Mental Status: Normal mood and affect. Normal behavior. Normal judgment and thought content.   Assessment and Plan:  Pregnancy: [id number] at [redacted]w[redacted]d 1. Encounter for supervision of normal pregnancy in second trimester, unspecified gravidity - frequent and vigorous fetal movement  2. [redacted] weeks gestation of pregnancy - continue bi-weekly follow-up in office   3. Poor fetal growth affecting management of mother in third trimester, single or unspecified fetus -EFW 7th%tile at 30w 2d - Continue weekly UA dopplers and antenatal testing until  delivery.  - Serial growth [place] every 3 weeks until delivery.  -  Delivery likely around 38 but will be determined by future  ultrasounds.  4. History of preterm delivery - no S/S of preterm labor  Preterm labor symptoms and general obstetric precautions including but not limited to vaginal bleeding, contractions, leaking of fluid and fetal movement were reviewed in detail with the patient. Please refer to After Visit Summary for other counseling recommendations.   Return in about 2 weeks (around 06/02/2023) for LOB, IN-PERSON, VIRTUAL.  Future Appointments  Date Time Provider Department Center  05/19/2023  3:30 PM [facility] NURSE [facility]  05/19/2023  3:45 PM [facility] US1 [facility]    Corlis Hove, NP

## 2023-05-20 ENCOUNTER — Other Ambulatory Visit: Payer: Self-pay | Admitting: *Deleted

## 2023-05-20 DIAGNOSIS — O365930 Maternal care for other known or suspected poor fetal growth, third trimester, not applicable or unspecified: Secondary | ICD-10-CM

## 2023-05-26 ENCOUNTER — Ambulatory Visit: Payer: Medicaid Other | Attending: Maternal & Fetal Medicine | Admitting: *Deleted

## 2023-05-26 DIAGNOSIS — Z3A31 31 weeks gestation of pregnancy: Secondary | ICD-10-CM

## 2023-05-26 DIAGNOSIS — O365930 Maternal care for other known or suspected poor fetal growth, third trimester, not applicable or unspecified: Secondary | ICD-10-CM | POA: Diagnosis not present

## 2023-05-26 NOTE — Procedures (Signed)
Bonnie Graham September 05, 1996 [redacted]w[redacted]d  Fetus A Non-Stress Test Interpretation for 05/26/23  Indication: IUGR  Fetal Heart Rate A Mode: External Baseline Rate (A): 125 bpm Variability: Moderate Accelerations: 15 x 15 Decelerations: None  Uterine Activity Mode: Palpation, Toco Contraction Frequency (min): None  Interpretation (Fetal Testing) Nonstress Test Interpretation: Reactive Comments: Dr. Darra Lis reviewed tracing.

## 2023-06-02 ENCOUNTER — Ambulatory Visit: Payer: Medicaid Other | Attending: Maternal & Fetal Medicine | Admitting: *Deleted

## 2023-06-02 DIAGNOSIS — O26893 Other specified pregnancy related conditions, third trimester: Secondary | ICD-10-CM | POA: Diagnosis not present

## 2023-06-02 DIAGNOSIS — Z3A32 32 weeks gestation of pregnancy: Secondary | ICD-10-CM

## 2023-06-02 DIAGNOSIS — O365930 Maternal care for other known or suspected poor fetal growth, third trimester, not applicable or unspecified: Secondary | ICD-10-CM

## 2023-06-02 DIAGNOSIS — O365990 Maternal care for other known or suspected poor fetal growth, unspecified trimester, not applicable or unspecified: Secondary | ICD-10-CM | POA: Diagnosis present

## 2023-06-02 NOTE — Procedures (Signed)
Bonnie Graham July 26, 1996 [redacted]w[redacted]d  Fetus A Non-Stress Test Interpretation for 06/02/23  Indication: IUGR  Fetal Heart Rate A Mode: External Baseline Rate (A): 125 bpm Variability: Moderate Accelerations: 15 x 15 Decelerations: None Multiple birth?: No  Uterine Activity Mode: Toco Contraction Frequency (min): One UC Contraction Duration (sec): 40 Contraction Quality: Mild Resting Tone Palpated: Relaxed Resting Time: Adequate  Interpretation (Fetal Testing) Nonstress Test Interpretation: Reactive Overall Impression: Reassuring for gestational age Comments: Tracing reviewed by Dr. Judeth Cornfield

## 2023-06-02 NOTE — Progress Notes (Signed)
Pt reports some decreased FM x3 days however is not progressively decreasing. Normal patterns of FM were discussed. NST reactive today. Pt was advised to go to MAU if she develops continued decrease in FM. She voiced understanding. Dr. Judeth Cornfield was informed of pt c/o. Pt has follow up growth [place], BPP and UA doppler scheduled on 7/31.

## 2023-06-09 ENCOUNTER — Encounter: Payer: Self-pay | Admitting: Obstetrics and Gynecology

## 2023-06-10 ENCOUNTER — Ambulatory Visit: Payer: Medicaid Other | Attending: Maternal & Fetal Medicine

## 2023-06-10 ENCOUNTER — Other Ambulatory Visit: Payer: Self-pay | Admitting: *Deleted

## 2023-06-10 DIAGNOSIS — O365990 Maternal care for other known or suspected poor fetal growth, unspecified trimester, not applicable or unspecified: Secondary | ICD-10-CM

## 2023-06-10 DIAGNOSIS — O09213 Supervision of pregnancy with history of pre-term labor, third trimester: Secondary | ICD-10-CM

## 2023-06-10 DIAGNOSIS — Z3A34 34 weeks gestation of pregnancy: Secondary | ICD-10-CM

## 2023-06-10 DIAGNOSIS — O365930 Maternal care for other known or suspected poor fetal growth, third trimester, not applicable or unspecified: Secondary | ICD-10-CM

## 2023-06-11 ENCOUNTER — Encounter: Payer: Self-pay | Admitting: Obstetrics and Gynecology

## 2023-06-11 ENCOUNTER — Telehealth (INDEPENDENT_AMBULATORY_CARE_PROVIDER_SITE_OTHER): Payer: Medicaid Other | Admitting: Obstetrics and Gynecology

## 2023-06-11 VITALS — BP 109/66 | HR 85

## 2023-06-11 DIAGNOSIS — Z8751 Personal history of pre-term labor: Secondary | ICD-10-CM

## 2023-06-11 DIAGNOSIS — Z3A34 34 weeks gestation of pregnancy: Secondary | ICD-10-CM

## 2023-06-11 DIAGNOSIS — O365930 Maternal care for other known or suspected poor fetal growth, third trimester, not applicable or unspecified: Secondary | ICD-10-CM

## 2023-06-11 DIAGNOSIS — D509 Iron deficiency anemia, unspecified: Secondary | ICD-10-CM

## 2023-06-11 DIAGNOSIS — O09293 Supervision of pregnancy with other poor reproductive or obstetric history, third trimester: Secondary | ICD-10-CM

## 2023-06-11 DIAGNOSIS — Z3493 Encounter for supervision of normal pregnancy, unspecified, third trimester: Secondary | ICD-10-CM

## 2023-06-11 DIAGNOSIS — O99013 Anemia complicating pregnancy, third trimester: Secondary | ICD-10-CM

## 2023-06-11 NOTE — Progress Notes (Signed)
OBSTETRICS PRENATAL VIRTUAL VISIT ENCOUNTER NOTE  Provider location: [facility] [place]   Patient location: Home  I connected with [name] on 06/11/23 at  4:10 PM EDT by MyChart Video Encounter and verified that I am speaking with the correct person using two identifiers. I discussed the limitations, risks, security and privacy concerns of performing an evaluation and management service virtually and the availability of in person appointments. I also discussed with the patient that there may be a patient responsible charge related to this service. The patient expressed understanding and agreed to proceed. Subjective:  Bonnie [facility] is a 27 y.o. [phone] at [redacted]w[redacted]d being seen today for ongoing prenatal care.  She is currently monitored for the following issues for this high-risk pregnancy and has History of preterm delivery; Supervision of normal pregnancy; IUGR (intrauterine growth restriction) affecting care of mother; and Anemia on their problem list.  Patient reports no complaints.  Contractions: Irritability. Vag. Bleeding: None.  Movement: Present. Denies any leaking of fluid.   The following portions of the patient's history were reviewed and updated as appropriate: allergies, current medications, past family history, past medical history, past social history, past surgical history and problem list.   Objective:   Vitals:   06/11/23 1601  BP: 109/66  Pulse: 85    Fetal Status:     Movement: Present     General:  Alert, oriented and cooperative. Patient is in no acute distress.  Respiratory: Normal respiratory effort, no problems with respiration noted  Mental Status: Normal mood and affect. Normal behavior. Normal judgment and thought content.  Rest of physical exam deferred due to type of encounter  Imaging: [place] MFM FETAL BPP WO NON STRESS  Result Date: 06/10/2023 ----------------------------------------------------------------------   OBSTETRICS REPORT                       (Signed Final 06/10/2023 04:31 pm) ---------------------------------------------------------------------- Patient Info  ID #:       [id number]                          D.O.B.:  1996/11/08 (27 yrs)  Name:       Bonnie [facility]               Visit Date: 06/10/2023 03:37 pm ---------------------------------------------------------------------- Performed By  Attending:        Lin Landsman      Ref. Address:     [address]                    [place]                                                             [place], [place]                                                             [address]  Performed By:     Earley Brooke     Location:  [facility]                    BS, RDMS                                 [facility]  Referred By:      Lawernce Pitts CNM ---------------------------------------------------------------------- Orders  #  Description                           Code        Ordered By  1  [place] MFM FETAL BPP WO NON               76819.01    CORENTHIAN     STRESS                                            BOOKER  2  [place] MFM OB FOLLOW UP                   76816.01    Lin Landsman  3  [place] MFM UA CORD DOPPLER                40981.19    Lin Landsman ----------------------------------------------------------------------  #  Order #                     Accession #                Episode #  1  [id number]                   [id number]                 [id number]  2  [id number]  [id number]                 [id number]  3  [id number]                   [id number]                 [id number] ---------------------------------------------------------------------- Indications  Maternal care  for known or suspected poor      O36.5930  fetal growth, third trimester, not applicable or  unspecified IUGR  [redacted] weeks gestation of pregnancy                Z3A.34  Poor obstetric history: Previous preterm       O09.219  delivery (33w, 104w6d, 36w) ---------------------------------------------------------------------- Fetal Evaluation  Num Of Fetuses:         1  Fetal Heart Rate(bpm):  135  Cardiac Activity:       Observed  Presentation:           Cephalic  Placenta:               Posterior Fundal  P. Cord Insertion:      Previously visualized  Amniotic Fluid  AFI FV:      Within normal limits  AFI Sum(cm)     %Tile       Largest Pocket(cm)  13.48           45          4.09  RUQ(cm)       RLQ(cm)       LUQ(cm)        LLQ(cm)  4.09          2.87          3.91           2.61 ---------------------------------------------------------------------- Biophysical Evaluation  Amniotic F.V:   Within normal limits       F. Tone:        Observed  F. Movement:    Observed                   Score:          8/8  F. Breathing:   Observed ---------------------------------------------------------------------- Biometry  BPD:      84.9  mm     G. Age:  34w 1d         54  %    CI:        79.77   %    70 - 86                                                          FL/HC:      20.6   %    19.4 - 21.8  HC:      300.4  mm     G. Age:  33w 2d          7  %    HC/AC:      1.07        0.96 - 1.11  AC:      280.4  mm     G. Age:  32w 1d          8  %    FL/BPD:     73.0   %  71 - 87  FL:         62  mm     G. Age:  32w 1d          6  %    FL/AC:      22.1   %    20 - 24  HUM:      54.3  mm     G. Age:  31w 4d          8  %  Est. FW:    1967  gm      4 lb 5 oz      9  % ---------------------------------------------------------------------- OB History  Gravidity:    4         Term:   0        Prem:   3        SAB:   0  TOP:          0       Ectopic:  0        Living: 3 ----------------------------------------------------------------------  Gestational Age  LMP:           34w 0d        Date:  10/15/22                  EDD:   07/22/23  U/S Today:     33w 0d                                        EDD:   07/29/23  Best:          34w 0d     Det. By:  LMP  (10/15/22)          EDD:   07/22/23 ---------------------------------------------------------------------- Anatomy  Cranium:               Appears normal         LVOT:                   Previously seen  Cavum:                 Previously seen        Aortic Arch:            Previously seen  Ventricles:            Previously seen        Ductal Arch:            Previously seen  Choroid Plexus:        Previously seen        Diaphragm:              Appears normal  Cerebellum:            Previously seen        Stomach:                Appears normal, left                                                                        sided  Posterior Fossa:       Previously seen        Abdomen:                Appears normal  Nuchal Fold:           Previously seen        Abdominal Wall:         Previously seen  Face:                  Orbits and profile     Cord Vessels:           Previously seen                         previously seen  Lips:                  Previously seen        Kidneys:                Appear normal  Palate:                Previously seen        Bladder:                Appears normal  Thoracic:              Previously seen        Spine:                  Previously seen  Heart:                 Appears normal         Upper Extremities:      Previously seen                         (4CH, axis, and                         situs)  RVOT:                  Previously seen        Lower Extremities:      Previously seen  Other:  Female gender previously seen. IVC/SVC, hands, heels/feet, Lenses          previously visualized. Nasal bone, VC, 3VV and 3VTV prev visualized. ---------------------------------------------------------------------- Doppler - Fetal Vessels  Umbilical Artery   S/D     %tile      RI    %tile      PI     %tile     PSV    ADFV    RDFV                                                     (cm/s)   3.25       85    0.69       87    1.13       91    46.03      No      No ---------------------------------------------------------------------- Cervix Uterus Adnexa  Cervix  Not visualized (advanced GA >24wks) ---------------------------------------------------------------------- Impression  Follow up growth due to known FGR  Normal  interval growth with measurements consistent with  dates  Good fetal movement and amniotic fluid volume  The UA Dopplers is normal without evidence of AEDF or  REDF.  BPP 8/8 ---------------------------------------------------------------------- Recommendations  Continue weekly testing with UA Dopplers  Repeat growth in 3-4 weeks. ----------------------------------------------------------------------              Lin Landsman, MD Electronically Signed Final Report   06/10/2023 04:31 pm ----------------------------------------------------------------------   [place] MFM OB FOLLOW UP  Result Date: 06/10/2023 ----------------------------------------------------------------------  OBSTETRICS REPORT                       (Signed Final 06/10/2023 04:31 pm) ---------------------------------------------------------------------- Patient Info  ID #:       [id number]                          D.O.B.:  13-Mar-1996 (27 yrs)  Name:       [facility]               Visit Date: 06/10/2023 03:37 pm ---------------------------------------------------------------------- Performed By  Attending:        Lin Landsman      Ref. Address:     [address]                    [place]                                                             [place], [place]                                                             [address]  Performed By:     Earley Brooke     Location:         [facility]                    BS, RDMS                                 [facility]                                                              [facility]  Referred By:      Lawernce Pitts CNM ---------------------------------------------------------------------- Orders  #  Description                           Code        Ordered By  1  [place] MFM FETAL BPP WO NON               76819.01    CORENTHIAN     STRESS                                            BOOKER  2  [place] MFM OB FOLLOW UP                   76816.01    Lin Landsman  3  [place] MFM UA CORD DOPPLER                76820.02    Lin Landsman ----------------------------------------------------------------------  #  Order #                     Accession #                Episode #  1  [id number]                   [id number]                 [id number]  2  [id number]                   [id number]                 [id number]  3  [id number]                   [id number]                 [id number] ---------------------------------------------------------------------- Indications  Maternal care for known or suspected poor      O36.5930  fetal growth, third trimester, not applicable or  unspecified IUGR  [redacted] weeks gestation of pregnancy                Z3A.34  Poor obstetric history: Previous preterm       O09.219  delivery (33w, [redacted]w[redacted]d, 36w) ---------------------------------------------------------------------- Fetal Evaluation  Num Of Fetuses:         1  Fetal Heart Rate(bpm):  135  Cardiac Activity:       Observed  Presentation:           Cephalic  Placenta:               Posterior Fundal  P. Cord Insertion:      Previously visualized  Amniotic Fluid  AFI FV:  Within normal limits  AFI Sum(cm)     %Tile       Largest Pocket(cm)  13.48           45          4.09  RUQ(cm)       RLQ(cm)       LUQ(cm)        LLQ(cm)  4.09          2.87          3.91           2.61  ---------------------------------------------------------------------- Biophysical Evaluation  Amniotic F.V:   Within normal limits       F. Tone:        Observed  F. Movement:    Observed                   Score:          8/8  F. Breathing:   Observed ---------------------------------------------------------------------- Biometry  BPD:      84.9  mm     G. Age:  34w 1d         54  %    CI:        79.77   %    70 - 86                                                          FL/HC:      20.6   %    19.4 - 21.8  HC:      300.4  mm     G. Age:  33w 2d          7  %    HC/AC:      1.07        0.96 - 1.11  AC:      280.4  mm     G. Age:  32w 1d          8  %    FL/BPD:     73.0   %    71 - 87  FL:         62  mm     G. Age:  32w 1d          6  %    FL/AC:      22.1   %    20 - 24  HUM:      54.3  mm     G. Age:  31w 4d          8  %  Est. FW:    1967  gm      4 lb 5 oz      9  % ---------------------------------------------------------------------- OB History  Gravidity:    4         Term:   0        Prem:   3        SAB:   0  TOP:          0       Ectopic:  0        Living: 3 ---------------------------------------------------------------------- Gestational Age  LMP:           34w 0d  Date:  10/15/22                  EDD:   07/22/23  U/S Today:     33w 0d                                        EDD:   07/29/23  Best:          34w 0d     Det. By:  LMP  (10/15/22)          EDD:   07/22/23 ---------------------------------------------------------------------- Anatomy  Cranium:               Appears normal         LVOT:                   Previously seen  Cavum:                 Previously seen        Aortic Arch:            Previously seen  Ventricles:            Previously seen        Ductal Arch:            Previously seen  Choroid Plexus:        Previously seen        Diaphragm:              Appears normal  Cerebellum:            Previously seen        Stomach:                Appears normal, left                                                                         sided  Posterior Fossa:       Previously seen        Abdomen:                Appears normal  Nuchal Fold:           Previously seen        Abdominal Wall:         Previously seen  Face:                  Orbits and profile     Cord Vessels:           Previously seen                         previously seen  Lips:                  Previously seen        Kidneys:                Appear normal  Palate:                Previously seen        Bladder:  Appears normal  Thoracic:              Previously seen        Spine:                  Previously seen  Heart:                 Appears normal         Upper Extremities:      Previously seen                         (4CH, axis, and                         situs)  RVOT:                  Previously seen        Lower Extremities:      Previously seen  Other:  Female gender previously seen. IVC/SVC, hands, heels/feet, Lenses          previously visualized. Nasal bone, VC, 3VV and 3VTV prev visualized. ---------------------------------------------------------------------- Doppler - Fetal Vessels  Umbilical Artery   S/D     %tile      RI    %tile      PI    %tile     PSV    ADFV    RDFV                                                     (cm/s)   3.25       85    0.69       87    1.13       91    46.03      No      No ---------------------------------------------------------------------- Cervix Uterus Adnexa  Cervix  Not visualized (advanced GA >24wks) ---------------------------------------------------------------------- Impression  Follow up growth due to known FGR  Normal interval growth with measurements consistent with  dates  Good fetal movement and amniotic fluid volume  The UA Dopplers is normal without evidence of AEDF or  REDF.  BPP 8/8 ---------------------------------------------------------------------- Recommendations  Continue weekly testing with UA Dopplers  Repeat growth in 3-4 weeks.  ----------------------------------------------------------------------              Lin Landsman, MD Electronically Signed Final Report   06/10/2023 04:31 pm ----------------------------------------------------------------------   [place] MFM UA CORD DOPPLER  Result Date: 06/10/2023 ----------------------------------------------------------------------  OBSTETRICS REPORT                       (Signed Final 06/10/2023 04:31 pm) ---------------------------------------------------------------------- Patient Info  ID #:       [id number]                          D.O.B.:  09-01-1996 (27 yrs)  Name:       [facility]               Visit Date: 06/10/2023 03:37 pm ---------------------------------------------------------------------- Performed By  Attending:        Lin Landsman      Ref. Address:     [address]  [place]                                                             [place], [place]                                                             [address]  Performed By:     Earley Brooke     Location:         [facility]                    BS, RDMS                                 [facility] at                                                             [facility]  Referred By:      Lawernce Pitts CNM ---------------------------------------------------------------------- Orders  #  Description                           Code        Ordered By  1  [place] MFM FETAL BPP WO NON               76819.01    CORENTHIAN     STRESS                                            BOOKER  2  [place] MFM OB FOLLOW UP                   76816.01    Lin Landsman  3  [place] MFM UA CORD DOPPLER                76820.02    CORENTHIAN  BOOKER ----------------------------------------------------------------------  #  Order #                      Accession #                Episode #  1  [id number]                   [id number]                 [id number]  2  [id number]                   [id number]                 [id number]  3  [id number]                   [id number]                 [id number] ---------------------------------------------------------------------- Indications  Maternal care for known or suspected poor      O36.5930  fetal growth, third trimester, not applicable or  unspecified IUGR  [redacted] weeks gestation of pregnancy                Z3A.34  Poor obstetric history: Previous preterm       O09.219  delivery (33w, [redacted]w[redacted]d, 36w) ---------------------------------------------------------------------- Fetal Evaluation  Num Of Fetuses:         1  Fetal Heart Rate(bpm):  135  Cardiac Activity:       Observed  Presentation:           Cephalic  Placenta:               Posterior Fundal  P. Cord Insertion:      Previously visualized  Amniotic Fluid  AFI FV:      Within normal limits  AFI Sum(cm)     %Tile       Largest Pocket(cm)  13.48           45          4.09  RUQ(cm)       RLQ(cm)       LUQ(cm)        LLQ(cm)  4.09          2.87          3.91           2.61 ---------------------------------------------------------------------- Biophysical Evaluation  Amniotic F.V:   Within normal limits       F. Tone:        Observed  F. Movement:    Observed                   Score:          8/8  F. Breathing:   Observed ---------------------------------------------------------------------- Biometry  BPD:      84.9  mm     G. Age:  34w 1d         54  %    CI:        79.77   %    70 - 86                                                          FL/HC:  20.6   %    19.4 - 21.8  HC:      300.4  mm     G. Age:  33w 2d          7  %    HC/AC:      1.07        0.96 - 1.11  AC:      280.4  mm     G. Age:  32w 1d          8  %    FL/BPD:     73.0   %    71 - 87  FL:         62  mm     G. Age:  32w 1d          6  %    FL/AC:      22.1   %    20 - 24  HUM:      54.3  mm      G. Age:  31w 4d          8  %  Est. FW:    1967  gm      4 lb 5 oz      9  % ---------------------------------------------------------------------- OB History  Gravidity:    4         Term:   0        Prem:   3        SAB:   0  TOP:          0       Ectopic:  0        Living: 3 ---------------------------------------------------------------------- Gestational Age  LMP:           34w 0d        Date:  10/15/22                  EDD:   07/22/23  U/S Today:     33w 0d                                        EDD:   07/29/23  Best:          34w 0d     Det. By:  LMP  (10/15/22)          EDD:   07/22/23 ---------------------------------------------------------------------- Anatomy  Cranium:               Appears normal         LVOT:                   Previously seen  Cavum:                 Previously seen        Aortic Arch:            Previously seen  Ventricles:            Previously seen        Ductal Arch:            Previously seen  Choroid Plexus:        Previously seen        Diaphragm:              Appears normal  Cerebellum:  Previously seen        Stomach:                Appears normal, left                                                                        sided  Posterior Fossa:       Previously seen        Abdomen:                Appears normal  Nuchal Fold:           Previously seen        Abdominal Wall:         Previously seen  Face:                  Orbits and profile     Cord Vessels:           Previously seen                         previously seen  Lips:                  Previously seen        Kidneys:                Appear normal  Palate:                Previously seen        Bladder:                Appears normal  Thoracic:              Previously seen        Spine:                  Previously seen  Heart:                 Appears normal         Upper Extremities:      Previously seen                         (4CH, axis, and                         situs)  RVOT:                  Previously  seen        Lower Extremities:      Previously seen  Other:  Female gender previously seen. IVC/SVC, hands, heels/feet, Lenses          previously visualized. Nasal bone, VC, 3VV and 3VTV prev visualized. ---------------------------------------------------------------------- Doppler - Fetal Vessels  Umbilical Artery   S/D     %tile      RI    %tile      PI    %tile     PSV    ADFV    RDFV                                                     (  cm/s)   3.25       85    0.69       87    1.13       91    46.03      No      No ---------------------------------------------------------------------- Cervix Uterus Adnexa  Cervix  Not visualized (advanced GA >24wks) ---------------------------------------------------------------------- Impression  Follow up growth due to known FGR  Normal interval growth with measurements consistent with  dates  Good fetal movement and amniotic fluid volume  The UA Dopplers is normal without evidence of AEDF or  REDF.  BPP 8/8 ---------------------------------------------------------------------- Recommendations  Continue weekly testing with UA Dopplers  Repeat growth in 3-4 weeks. ----------------------------------------------------------------------              Lin Landsman, MD Electronically Signed Final Report   06/10/2023 04:31 pm ----------------------------------------------------------------------   [place] MFM OB FOLLOW UP  Result Date: 05/19/2023 ----------------------------------------------------------------------  OBSTETRICS REPORT                       (Signed Final 05/19/2023 04:20 pm) ---------------------------------------------------------------------- Patient Info  ID #:       [id number]                          D.O.B.:  1996/07/24 (27 yrs)  Name:       [facility]               Visit Date: 05/19/2023 03:43 pm ---------------------------------------------------------------------- Performed By  Attending:        Lin Landsman      Ref. Address:     [address]                     [place]                                                             [place], [place]                                                             [address]  Performed By:     Reinaldo Raddle            Location:         [facility]                    RDMS                                     [facility]                                                             [facility]  Referred By:      Lawernce Pitts CNM ---------------------------------------------------------------------- Orders  #  Description                           Code        Ordered By  1  [place] MFM OB FOLLOW UP                   76816.01    RAVI SHANKAR  2  [place] MFM UA CORD DOPPLER                76820.02    RAVI [facility] ----------------------------------------------------------------------  #  Order #                     Accession #                Episode #  1  [id number]                   [id number]                 [id number]  2  [id number]                   [id number]                 [id number] ---------------------------------------------------------------------- Indications  Maternal care for known or suspected poor      O36.5930  fetal growth, third trimester, not applicable or  unspecified IUGR  Poor obstetric history: Previous preterm       O09.219  delivery (33w, [redacted]w[redacted]d, 36w)  [redacted] weeks gestation of pregnancy                Z3A.30 ---------------------------------------------------------------------- Fetal Evaluation  Num Of Fetuses:         1  Fetal Heart Rate(bpm):  129  Cardiac Activity:       Observed  Presentation:           Cephalic  Placenta:               Posterior Fundal  P. Cord Insertion:      Previously visualized  Amniotic Fluid  AFI FV:      Within normal limits  AFI Sum(cm)     %Tile       Largest Pocket(cm)  13.71           44          4.24  RUQ(cm)       RLQ(cm)       LUQ(cm)        LLQ(cm)  4.24          2.06          3.44           3.97  ---------------------------------------------------------------------- Biometry  BPD:      81.2  mm     G. Age:  32w 4d         87  %    CI:        78.64   %    70 - 86  FL/HC:      18.8   %    19.3 - 21.3  HC:      289.6  mm     G. Age:  31w 6d         42  %    HC/AC:      1.15        0.96 - 1.17  AC:      251.1  mm     G. Age:  29w 2d          9  %    FL/BPD:     67.1   %    71 - 87  FL:       54.5  mm     G. Age:  28w 6d        2.7  %    FL/AC:      21.7   %    20 - 24  HUM:        50  mm     G. Age:  29w 2d         19  %  LV:        4.1  mm  Est. FW:    1426  gm      3 lb 2 oz      9  % ---------------------------------------------------------------------- OB History  Gravidity:    4         Term:   0        Prem:   3        SAB:   0  TOP:          0       Ectopic:  0        Living: 3 ---------------------------------------------------------------------- Gestational Age  LMP:           30w 6d        Date:  10/15/22                  EDD:   07/22/23  U/S Today:     30w 5d                                        EDD:   07/23/23  Best:          30w 6d     Det. By:  LMP  (10/15/22)          EDD:   07/22/23 ---------------------------------------------------------------------- Anatomy  Cranium:               Appears normal         LVOT:                   Previously seen  Cavum:                 Appears normal         Aortic Arch:            Previously seen  Ventricles:            Appears normal         Ductal Arch:            Previously seen  Choroid Plexus:        Previously seen        Diaphragm:  Appears normal  Cerebellum:            Previously seen        Stomach:                Appears normal, left                                                                        sided  Posterior Fossa:       Previously seen        Abdomen:                Appears normal  Nuchal Fold:           Previously seen        Abdominal Wall:         Previously seen  Face:                   Profile nl; orbits     Cord Vessels:           Previously seen                         prev visualized  Lips:                  Previously seen        Kidneys:                Appear normal  Palate:                Previously seen        Bladder:                Appears normal  Thoracic:              Previously seen        Spine:                  Previously seen  Heart:                 Appears normal         Upper Extremities:      Previously seen                         (4CH, axis, and                         situs)  RVOT:                  Previously seen        Lower Extremities:      Previously seen  Other:  Female gender previously seen. IVC/SVC, hands, heels/feet, Lenses          previously visualized. Nasal bone, VC, 3VV and 3VTV prev visualized. ---------------------------------------------------------------------- Doppler - Fetal Vessels  Umbilical Artery   S/D     %tile      RI    %tile      PI    %tile     PSV    ADFV    RDFV                                                     (  cm/s)   3.71       90    0.73       90    1.22       92     35.3      No      No ---------------------------------------------------------------------- Cervix Uterus Adnexa  Cervix  Not visualized (advanced GA >24wks)  Uterus  No abnormality visualized.  Right Ovary  Size(cm)     2.84   x   2.36   x  1.36      Vol(ml): 4.77  Within normal limits.  Left Ovary  Size(cm)     2.25   x   1.14   x  1.18      Vol(ml): 1.58  Within normal limits.  Cul De Sac  No free fluid seen.  Adnexa  No adnexal mass visualized ---------------------------------------------------------------------- Impression  Follow up growth due to FGR.  Normal interval growth with measurements consistent with  FGR  Good fetal movement and amniotic fluid volume  The UA Dopplers are normal without evidence of AEDF or  REDF. ---------------------------------------------------------------------- Recommendations  Continue weekly UA Dopplers with NST until 32 weeks  and  then BPP with UAD.  Repeat growth in 3 weeks. ----------------------------------------------------------------------              Lin Landsman, MD Electronically Signed Final Report   05/19/2023 04:20 pm ----------------------------------------------------------------------  [place] MFM UA CORD DOPPLER  Result Date: 05/19/2023 ----------------------------------------------------------------------  OBSTETRICS REPORT                       (Signed Final 05/19/2023 04:20 pm) ---------------------------------------------------------------------- Patient Info  ID #:       [id number]                          D.O.B.:  Jun 10, 1996 (27 yrs)  Name:       [facility]               Visit Date: 05/19/2023 03:43 pm ---------------------------------------------------------------------- Performed By  Attending:        Lin Landsman      Ref. Address:     [address]                    [place]                                                             [place], [place]                                                             [address]  Performed By:     Reinaldo Raddle            Location:         [facility]                    RD[facility]  Referred By:      Lawernce Pitts CNM ---------------------------------------------------------------------- Orders  #  Description                           Code        Ordered By  1  [place] MFM OB FOLLOW UP                   76816.01    RAVI SHANKAR  2  [place] MFM UA CORD DOPPLER                76820.02    RAVI [facility] ----------------------------------------------------------------------  #  Order #                     Accession #                Episode #  1  [id number]                   [id number]                 [id number]  2  [id number]                   [id number]                 [id number]  ---------------------------------------------------------------------- Indications  Maternal care for known or suspected poor      O36.5930  fetal growth, third trimester, not applicable or  unspecified IUGR  Poor obstetric history: Previous preterm       O09.219  delivery (33w, [redacted]w[redacted]d, 36w)  [redacted] weeks gestation of pregnancy                Z3A.30 ---------------------------------------------------------------------- Fetal Evaluation  Num Of Fetuses:         1  Fetal Heart Rate(bpm):  129  Cardiac Activity:       Observed  Presentation:           Cephalic  Placenta:               Posterior Fundal  P. Cord Insertion:      Previously visualized  Amniotic Fluid  AFI FV:      Within normal limits  AFI Sum(cm)     %Tile       Largest Pocket(cm)  13.71           44          4.24  RUQ(cm)       RLQ(cm)       LUQ(cm)  LLQ(cm)  4.24          2.06          3.44           3.97 ---------------------------------------------------------------------- Biometry  BPD:      81.2  mm     G. Age:  32w 4d         87  %    CI:        78.64   %    70 - 86                                                          FL/HC:      18.8   %    19.3 - 21.3  HC:      289.6  mm     G. Age:  31w 6d         42  %    HC/AC:      1.15        0.96 - 1.17  AC:      251.1  mm     G. Age:  29w 2d          9  %    FL/BPD:     67.1   %    71 - 87  FL:       54.5  mm     G. Age:  28w 6d        2.7  %    FL/AC:      21.7   %    20 - 24  HUM:        50  mm     G. Age:  29w 2d         19  %  LV:        4.1  mm  Est. FW:    1426  gm      3 lb 2 oz      9  % ---------------------------------------------------------------------- OB History  Gravidity:    4         Term:   0        Prem:   3        SAB:   0  TOP:          0       Ectopic:  0        Living: 3 ---------------------------------------------------------------------- Gestational Age  LMP:           30w 6d        Date:  10/15/22                  EDD:   07/22/23  U/S Today:     30w 5d                                         EDD:   07/23/23  Best:          30w 6d     Det. By:  LMP  (10/15/22)          EDD:   07/22/23 ---------------------------------------------------------------------- Anatomy  Cranium:  Appears normal         LVOT:                   Previously seen  Cavum:                 Appears normal         Aortic Arch:            Previously seen  Ventricles:            Appears normal         Ductal Arch:            Previously seen  Choroid Plexus:        Previously seen        Diaphragm:              Appears normal  Cerebellum:            Previously seen        Stomach:                Appears normal, left                                                                        sided  Posterior Fossa:       Previously seen        Abdomen:                Appears normal  Nuchal Fold:           Previously seen        Abdominal Wall:         Previously seen  Face:                  Profile nl; orbits     Cord Vessels:           Previously seen                         prev visualized  Lips:                  Previously seen        Kidneys:                Appear normal  Palate:                Previously seen        Bladder:                Appears normal  Thoracic:              Previously seen        Spine:                  Previously seen  Heart:                 Appears normal         Upper Extremities:      Previously seen                         (4CH, axis, and  situs)  RVOT:                  Previously seen        Lower Extremities:      Previously seen  Other:  Female gender previously seen. IVC/SVC, hands, heels/feet, Lenses          previously visualized. Nasal bone, VC, 3VV and 3VTV prev visualized. ---------------------------------------------------------------------- Doppler - Fetal Vessels  Umbilical Artery   S/D     %tile      RI    %tile      PI    %tile     PSV    ADFV    RDFV                                                     (cm/s)   3.71       90    0.73       90    1.22        92     35.3      No      No ---------------------------------------------------------------------- Cervix Uterus Adnexa  Cervix  Not visualized (advanced GA >24wks)  Uterus  No abnormality visualized.  Right Ovary  Size(cm)     2.84   x   2.36   x  1.36      Vol(ml): 4.77  Within normal limits.  Left Ovary  Size(cm)     2.25   x   1.14   x  1.18      Vol(ml): 1.58  Within normal limits.  Cul De Sac  No free fluid seen.  Adnexa  No adnexal mass visualized ---------------------------------------------------------------------- Impression  Follow up growth due to FGR.  Normal interval growth with measurements consistent with  FGR  Good fetal movement and amniotic fluid volume  The UA Dopplers are normal without evidence of AEDF or  REDF. ---------------------------------------------------------------------- Recommendations  Continue weekly UA Dopplers with NST until 32 weeks and  then BPP with UAD.  Repeat growth in 3 weeks. ----------------------------------------------------------------------              Lin Landsman, MD Electronically Signed Final Report   05/19/2023 04:20 pm ----------------------------------------------------------------------  [place] MFM UA CORD DOPPLER  Result Date: 05/15/2023 ----------------------------------------------------------------------  OBSTETRICS REPORT                       (Signed Final 05/15/2023 11:35 am) ---------------------------------------------------------------------- Patient Info  ID #:       [id number]                          D.O.B.:  December 07, 1995 (27 yrs)  Name:       [facility]               Visit Date: 05/15/2023 08:33 am ---------------------------------------------------------------------- Performed By  Attending:        Braxton Feathers DO       Ref. Address:     [address]  [place], [place]                                                             [address]  Performed By:     [facility]nie [facility] BS,      Location:          [facility]                    RDMS                                     [facility]  Referred By:      Lawernce Pitts CNM ---------------------------------------------------------------------- Orders  #  Description                           Code        Ordered By  1  [place] MFM UA CORD DOPPLER                76820.02    RAVI SHANKAR  2  [place] MFM OB LIMITED                     76815.01    RAVI SHANKAR  3  [place] MFM FETAL BPP WO NON               76819.01    RAVI [facility]     STRESS ----------------------------------------------------------------------  #  Order #                     Accession #                Episode #  1  [id number]                   [id number]                 [id number]  2  [id number]                   [id number]                 [id number]  3  [id number]                   [id number]  [id number] ---------------------------------------------------------------------- Indications  Maternal care for known or suspected poor      O36.5930  fetal growth, third trimester, not applicable or  unspecified IUGR  Poor obstetric history: Previous preterm       O09.219  delivery (33w, [redacted]w[redacted]d, 36w)  [redacted] weeks gestation of pregnancy                Z3A.30 ---------------------------------------------------------------------- Vital Signs  BP:          125/66 ---------------------------------------------------------------------- Fetal Evaluation  Num Of Fetuses:         1  Fetal Heart Rate(bpm):  126  Cardiac Activity:       Observed  Presentation:           Cephalic  Placenta:               Posterior Fundal  P. Cord Insertion:      Previously visualized  Amniotic Fluid  AFI FV:      Within normal limits  AFI Sum(cm)     %Tile       Largest Pocket(cm)  16.19           59          4.79  RUQ(cm)       RLQ(cm)       LUQ(cm)        LLQ(cm)  4.55           3.83          3.02           4.79 ---------------------------------------------------------------------- Biophysical Evaluation  Amniotic F.V:   Pocket => 2 cm             F. Tone:        Observed  F. Movement:    Observed                   Score:          8/8  F. Breathing:   Observed ---------------------------------------------------------------------- OB History  Gravidity:    4         Term:   0        Prem:   3        SAB:   0  TOP:          0       Ectopic:  0        Living: 3 ---------------------------------------------------------------------- Gestational Age  LMP:           30w 2d        Date:  10/15/22                  EDD:   07/22/23  Best:          [facility] 2d     Det. By:  LMP  (10/15/22)          EDD:   07/22/23 ---------------------------------------------------------------------- Anatomy  Diaphragm:             Appears normal         Bladder:                Appears normal  Stomach:               Appears normal, left                         sided ---------------------------------------------------------------------- Doppler - Fetal Vessels  Umbilical Artery   S/D     %  tile                                              ADFV    RDFV   3.77       90                                                 No      No ---------------------------------------------------------------------- Comments  The patient is here for a follow-up ultrasound for FGR (EFW  7th)) at 30w 2d. EDD: 07/22/2023. Dating: LMP  (10/15/22).  She has no concerns today.  Sonographic findings  Single intrauterine pregnancy.  Fetal cardiac activity:  Observed and appears normal.  Presentation: Cephalic.  Interval fetal anatomy appears normal.  Amniotic fluid volume: Within normal limits. AFI: 16.19 cm.  MVP: 4.79 cm.  Placenta: Posterior Fundal.  Umbilical artery dopplers findings:  -S/D:3.77 which are normal at this gestational age.  -Absent end-diastolic flow: No.  -Reversed end-diastolic flow:  No.  BPP 8/8.  Recommendations  - Continue  weekly UA dopplers and antenatal testing until  delivery.  - Serial growth [place] every 3 weeks until delivery.  - Delivery likely around 38 but will be determined by future  ultrasounds. ----------------------------------------------------------------------                 Braxton Feathers, DO Electronically Signed Final Report   05/15/2023 11:35 am ----------------------------------------------------------------------  [place] MFM OB LIMITED  Result Date: 05/15/2023 ----------------------------------------------------------------------  OBSTETRICS REPORT                       (Signed Final 05/15/2023 11:35 am) ---------------------------------------------------------------------- Patient Info  ID #:       [id number]                          D.O.B.:  [birth year] (27 yrs)  Name:       [facility] [address]               Visit Date: 05/15/2023 08:33 am ---------------------------------------------------------------------- Performed By  Attending:        Braxton Feathers DO       Ref. Address:     [address]                                                             [place], [place]                                                             [address]  Performed By:     [facility]nie [facility] BS,      Location:         [facility]  RDMS                                     [facility]                                                             [facility]  Referred By:      Lawernce Pitts CNM ---------------------------------------------------------------------- Orders  #  Description                           Code        Ordered By  1  [place] MFM UA CORD DOPPLER                76820.02    RAVI SHANKAR  2  [place] MFM OB LIMITED                     76815.01    RAVI SHANKAR  3  [place] MFM FETAL BPP WO NON               76819.01    RAVI [facility]     STRESS ----------------------------------------------------------------------  #  Order #                      Accession #                Episode #  1  [id number]                   [id number]                 [id number]  2  [id number]                   [id number]                 [id number]  3  [id number]                   [id number]                 [id number] ---------------------------------------------------------------------- Indications  Maternal care for known or suspected poor      O36.5930  fetal growth, third trimester, not applicable or  unspecified IUGR  Poor obstetric history: Previous preterm       O09.219  delivery (33w, [redacted]w[redacted]d, 36w)  [redacted] weeks gestation of pregnancy                Z3A.30 ---------------------------------------------------------------------- Vital Signs  BP:          125/66 ---------------------------------------------------------------------- Fetal Evaluation  Num Of Fetuses:         1  Fetal Heart Rate(bpm):  126  Cardiac Activity:       Observed  Presentation:           Cephalic  Placenta:               Posterior Fundal  P. Cord Insertion:      Previously visualized  Amniotic Fluid  AFI FV:      Within normal limits  AFI Sum(cm)     %Tile       Largest Pocket(cm)  16.19           59          4.79  RUQ(cm)       RLQ(cm)       LUQ(cm)        LLQ(cm)  4.55          3.83          3.02           4.79 ---------------------------------------------------------------------- Biophysical Evaluation  Amniotic F.V:   Pocket => 2 cm             F. Tone:        Observed  F. Movement:    Observed                   Score:          8/8  F. Breathing:   Observed ---------------------------------------------------------------------- OB History  Gravidity:    4         Term:   0        Prem:   3        SAB:   0  TOP:          0       Ectopic:  0        Living: 3 ---------------------------------------------------------------------- Gestational Age  LMP:           30w 2d        Date:  10/15/22                  EDD:   07/22/23  Best:          [facility] 2d     Det. By:  LMP  (10/15/22)          EDD:   07/22/23  ---------------------------------------------------------------------- Anatomy  Diaphragm:             Appears normal         Bladder:                Appears normal  Stomach:               Appears normal, left                         sided ---------------------------------------------------------------------- Doppler - Fetal Vessels  Umbilical Artery   S/D     %tile                                              ADFV    RDFV   3.77       90  No      No ---------------------------------------------------------------------- Comments  The patient is here for a follow-up ultrasound for FGR (EFW  7th)) at 30w 2d. EDD: 07/22/2023. Dating: LMP  (10/15/22).  She has no concerns today.  Sonographic findings  Single intrauterine pregnancy.  Fetal cardiac activity:  Observed and appears normal.  Presentation: Cephalic.  Interval fetal anatomy appears normal.  Amniotic fluid volume: Within normal limits. AFI: 16.19 cm.  MVP: 4.79 cm.  Placenta: Posterior Fundal.  Umbilical artery dopplers findings:  -S/D:3.77 which are normal at this gestational age.  -Absent end-diastolic flow: No.  -Reversed end-diastolic flow:  No.  BPP 8/8.  Recommendations  - Continue weekly UA dopplers and antenatal testing until  delivery.  - Serial growth [place] every 3 weeks until delivery.  - Delivery likely around 38 but will be determined by future  ultrasounds. ----------------------------------------------------------------------                 Braxton Feathers, DO Electronically Signed Final Report   05/15/2023 11:35 am ----------------------------------------------------------------------  [place] MFM FETAL BPP WO NON STRESS  Result Date: 05/15/2023 ----------------------------------------------------------------------  OBSTETRICS REPORT                       (Signed Final 05/15/2023 11:35 am) ---------------------------------------------------------------------- Patient Info  ID #:       [id number]                           D.O.B.:  October 12, 1996 (27 yrs)  Name:       [facility]               Visit Date: 05/15/2023 08:33 am ---------------------------------------------------------------------- Performed By  Attending:        Braxton Feathers DO       Ref. Address:     [address]                                                             [place], [place]                                                             [address]  Performed By:     [facility] BS,      Location:         [facility]                    RDMS                                     [facility]  Referred By:      Lawernce Pitts CNM ---------------------------------------------------------------------- Orders  #  Description                           Code        Ordered By  1  [place] MFM UA CORD DOPPLER                76820.02    RAVI SHANKAR  2  [place] MFM OB LIMITED                     76815.01    RAVI SHANKAR  3  [place] MFM FETAL BPP WO NON               76819.01    RAVI [facility]     STRESS ----------------------------------------------------------------------  #  Order #                     Accession #                Episode #  1  [id number]                   [id number]                 [id number]  2  [id number]                   [id number]                 [id number]  3  [id number]                   [id number]                 [id number] ---------------------------------------------------------------------- Indications  Maternal care for known or suspected poor      O36.5930  fetal growth, third trimester, not applicable or  unspecified IUGR  Poor obstetric history: Previous preterm       O09.219  delivery (33w, [redacted]w[redacted]d, 36w)  [redacted] weeks gestation of pregnancy                Z3A.30 ---------------------------------------------------------------------- Vital Signs  BP:          125/66  ---------------------------------------------------------------------- Fetal Evaluation  Num Of Fetuses:         1  Fetal Heart Rate(bpm):  126  Cardiac Activity:       Observed  Presentation:           Cephalic  Placenta:               Posterior Fundal  P. Cord Insertion:      Previously visualized  Amniotic Fluid  AFI FV:      Within normal limits  AFI Sum(cm)     %Tile       Largest Pocket(cm)  16.19           59          4.79  RUQ(cm)       RLQ(cm)       LUQ(cm)        LLQ(cm)  4.55          3.83          3.02  4.79 ---------------------------------------------------------------------- Biophysical Evaluation  Amniotic F.V:   Pocket => 2 cm             F. Tone:        Observed  F. Movement:    Observed                   Score:          8/8  F. Breathing:   Observed ---------------------------------------------------------------------- OB History  Gravidity:    4         Term:   0        Prem:   3        SAB:   0  TOP:          0       Ectopic:  0        Living: 3 ---------------------------------------------------------------------- Gestational Age  LMP:           30w 2d        Date:  10/15/22                  EDD:   07/22/23  Best:          Bonnie [facility] 2d     Det. By:  LMP  (10/15/22)          EDD:   07/22/23 ---------------------------------------------------------------------- Anatomy  Diaphragm:             Appears normal         Bladder:                Appears normal  Stomach:               Appears normal, left                         sided ---------------------------------------------------------------------- Doppler - Fetal Vessels  Umbilical Artery   S/D     %tile                                              ADFV    RDFV   3.77       90                                                 No      No ---------------------------------------------------------------------- Comments  The patient is here for a follow-up ultrasound for FGR (EFW  7th)) at 30w 2d. EDD: 07/22/2023. Dating: LMP  (10/15/22).  She has no  concerns today.  Sonographic findings  Single intrauterine pregnancy.  Fetal cardiac activity:  Observed and appears normal.  Presentation: Cephalic.  Interval fetal anatomy appears normal.  Amniotic fluid volume: Within normal limits. AFI: 16.19 cm.  MVP: 4.79 cm.  Placenta: Posterior Fundal.  Umbilical artery dopplers findings:  -S/D:3.77 which are normal at this gestational age.  -Absent end-diastolic flow: No.  -Reversed end-diastolic flow:  No.  BPP 8/8.  Recommendations  - Continue weekly UA dopplers and antenatal testing until  delivery.  - Serial growth [place] every 3 weeks until delivery.  - Delivery likely around 38 but will be determined by future  ultrasounds. ----------------------------------------------------------------------  Braxton Feathers, DO Electronically Signed Final Report   05/15/2023 11:35 am ----------------------------------------------------------------------   Assessment and Plan:  Pregnancy: [id number] at [redacted]w[redacted]d 1. Encounter for supervision of normal pregnancy in third trimester, unspecified gravidity Patient is doing well without complaints Patient is interested in waterbirth and is scheduled for class this evening Will try to coordinate appointment with midwife to further counsel Patient plans condoms for contraception  2. Poor fetal growth affecting management of mother in third trimester, single or unspecified fetus Continue antenatal testing and serial growth ultrasounds  3. History of preterm delivery   4. Iron deficiency anemia, unspecified iron deficiency anemia type Continue iron supplement  Preterm labor symptoms and general obstetric precautions including but not limited to vaginal bleeding, contractions, leaking of fluid and fetal movement were reviewed in detail with the patient. I discussed the assessment and treatment plan with the patient. The patient was provided an opportunity to ask questions and all were answered. The patient agreed with the  plan and demonstrated an understanding of the instructions. The patient was advised to call back or seek an in-person office evaluation/go to MAU at [facility] for any urgent or concerning symptoms. Please refer to After Visit Summary for other counseling recommendations.   I provided 15 minutes of face-to-face time during this encounter.  Return in about 2 weeks (around 06/25/2023) for in person, ROB, High risk.  Future Appointments  Date Time Provider Department [facility]  06/16/2023  2:30 PM [facility] US3 [facility]  06/22/2023  3:45 PM [facility] US1 [facility]  06/24/2023  9:10 AM Conan Bowens, MD [facility]  06/30/2023  2:30 PM [facility] US3 [facility]  07/01/2023  8:10 AM Conan Bowens, MD [facility]  07/08/2023  8:10 AM Milas Hock, MD [facility]  07/08/2023  1:30 PM [facility] NURSE [facility]  07/08/2023  1:45 PM [facility] US4 [facility]    Catalina Antigua, MD [facility] [employer], [facility]

## 2023-06-16 ENCOUNTER — Other Ambulatory Visit: Payer: Self-pay | Admitting: Maternal & Fetal Medicine

## 2023-06-16 ENCOUNTER — Ambulatory Visit: Payer: Medicaid Other | Admitting: *Deleted

## 2023-06-16 ENCOUNTER — Ambulatory Visit: Payer: Medicaid Other | Attending: Maternal & Fetal Medicine

## 2023-06-16 DIAGNOSIS — O365930 Maternal care for other known or suspected poor fetal growth, third trimester, not applicable or unspecified: Secondary | ICD-10-CM

## 2023-06-16 DIAGNOSIS — Z3A34 34 weeks gestation of pregnancy: Secondary | ICD-10-CM | POA: Diagnosis not present

## 2023-06-16 DIAGNOSIS — O09213 Supervision of pregnancy with history of pre-term labor, third trimester: Secondary | ICD-10-CM

## 2023-06-16 DIAGNOSIS — O0993 Supervision of high risk pregnancy, unspecified, third trimester: Secondary | ICD-10-CM

## 2023-06-16 NOTE — Procedures (Signed)
Bonnie Graham 10-30-1996 [redacted]w[redacted]d  Fetus A Non-Stress Test Interpretation for 06/16/23  Indication: IUGR and possible bradycardia or arrhythmia noted during U/S  Fetal Heart Rate A Mode: External Baseline Rate (A): 130 bpm Variability: Moderate Accelerations: 15 x 15 Decelerations: None Multiple birth?: No  Uterine Activity Mode: Palpation, Toco Contraction Frequency (min): Occas. Contraction Quality: Mild Resting Tone Palpated: Relaxed Resting Time: Adequate  Interpretation (Fetal Testing) Nonstress Test Interpretation: Reactive Comments: Dr. Darra Lis reviewed tracing.

## 2023-06-18 DIAGNOSIS — O0993 Supervision of high risk pregnancy, unspecified, third trimester: Secondary | ICD-10-CM | POA: Insufficient documentation

## 2023-06-22 ENCOUNTER — Ambulatory Visit: Payer: Medicaid Other | Attending: Maternal & Fetal Medicine

## 2023-06-22 DIAGNOSIS — O09213 Supervision of pregnancy with history of pre-term labor, third trimester: Secondary | ICD-10-CM

## 2023-06-22 DIAGNOSIS — O365930 Maternal care for other known or suspected poor fetal growth, third trimester, not applicable or unspecified: Secondary | ICD-10-CM | POA: Insufficient documentation

## 2023-06-22 DIAGNOSIS — Z3A35 35 weeks gestation of pregnancy: Secondary | ICD-10-CM

## 2023-06-24 ENCOUNTER — Ambulatory Visit (INDEPENDENT_AMBULATORY_CARE_PROVIDER_SITE_OTHER): Payer: Medicaid Other | Admitting: Obstetrics and Gynecology

## 2023-06-24 ENCOUNTER — Other Ambulatory Visit (HOSPITAL_COMMUNITY)
Admission: RE | Admit: 2023-06-24 | Discharge: 2023-06-24 | Disposition: A | Discharge status: Home / Self Care | Payer: Medicaid Other | Source: Ambulatory Visit | Attending: Obstetrics and Gynecology | Admitting: Obstetrics and Gynecology

## 2023-06-24 ENCOUNTER — Encounter: Payer: Self-pay | Admitting: Obstetrics and Gynecology

## 2023-06-24 VITALS — BP 133/84 | HR 112 | Wt 181.0 lb

## 2023-06-24 DIAGNOSIS — O0993 Supervision of high risk pregnancy, unspecified, third trimester: Secondary | ICD-10-CM

## 2023-06-24 DIAGNOSIS — Z3A36 36 weeks gestation of pregnancy: Secondary | ICD-10-CM

## 2023-06-24 DIAGNOSIS — D509 Iron deficiency anemia, unspecified: Secondary | ICD-10-CM

## 2023-06-24 DIAGNOSIS — Z1339 Encounter for screening examination for other mental health and behavioral disorders: Secondary | ICD-10-CM

## 2023-06-24 DIAGNOSIS — O365930 Maternal care for other known or suspected poor fetal growth, third trimester, not applicable or unspecified: Secondary | ICD-10-CM

## 2023-06-24 DIAGNOSIS — O365933 Maternal care for other known or suspected poor fetal growth, third trimester, fetus 3: Secondary | ICD-10-CM

## 2023-06-24 NOTE — Progress Notes (Signed)
   PRENATAL VISIT NOTE  Subjective:  Bonnie Graham is a 27 y.o. [phone] at [redacted]w[redacted]d being seen today for ongoing prenatal care.  She is currently monitored for the following issues for this high-risk pregnancy and has History of preterm delivery; Supervision of high risk pregnancy, antepartum, third trimester; IUGR (intrauterine growth restriction) affecting care of mother; and Anemia on their problem list.  Patient reports no complaints.  Contractions: Not present. Vag. Bleeding: None.  Movement: Present. Denies leaking of fluid.   The following portions of the patient's history were reviewed and updated as appropriate: allergies, current medications, past family history, past medical history, past social history, past surgical history and problem list.   Objective:   Vitals:   06/24/23 0903  BP: 133/84  Pulse: (!) 112  Weight: 82.1 kg    Fetal Status: Fetal Heart Rate (bpm): 134   Movement: Present     General:  Alert, oriented and cooperative. Patient is in no acute distress.  Skin: Skin is warm and dry. No rash noted.   Cardiovascular: Normal heart rate noted  Respiratory: Normal respiratory effort, no problems with respiration noted  Abdomen: Soft, gravid, appropriate for gestational age.  Pain/Pressure: Absent     Pelvic: Cervical exam performed in the presence of a chaperone Dilation: 3 Effacement (%): 50 Station: -1  Extremities: Normal range of motion.  Edema: None  Mental Status: Normal mood and affect. Normal behavior. Normal judgment and thought content.   Assessment and Plan:  Pregnancy: [id number] at [redacted]w[redacted]d  1. Supervision of high risk pregnancy, antepartum, third trimester - Culture, beta strep (group b only) - Cervicovaginal ancillary only( [place])  2. Poor fetal growth affecting management of mother in third trimester, single or unspecified fetus Last growth 9%, normal dopplers Per MFM note, will base delivery on next weeks growth [place]  3. Iron deficiency  anemia, unspecified iron deficiency anemia type  4. [redacted] weeks gestation of pregnancy   Preterm labor symptoms and general obstetric precautions including but not limited to vaginal bleeding, contractions, leaking of fluid and fetal movement were reviewed in detail with the patient. Please refer to After Visit Summary for other counseling recommendations.   Return in about 1 week (around 07/01/2023) for high OB.  Future Appointments  Date Time Provider Department Center  06/30/2023  2:30 PM [facility] US3 [facility]  07/01/2023  8:10 AM Conan Bowens, MD [facility]  07/08/2023  8:10 AM Milas Hock, MD [facility]  07/08/2023  1:30 PM [facility] NURSE [facility]  07/08/2023  1:45 PM [facility] US4 [facility]    Conan Bowens, MD

## 2023-06-24 NOTE — Patient Instructions (Signed)
Go to the [facility] [place] Address: [address], [place], [place] [address] Phone: [phone]  

## 2023-06-25 LAB — CERVICOVAGINAL ANCILLARY ONLY
Chlamydia: NEGATIVE
Comment: NEGATIVE
Comment: NORMAL
Neisseria Gonorrhea: NEGATIVE

## 2023-06-27 LAB — CULTURE, BETA STREP (GROUP B ONLY): Strep Gp B Culture: NEGATIVE

## 2023-06-29 ENCOUNTER — Telehealth: Payer: Self-pay

## 2023-06-29 NOTE — Progress Notes (Unsigned)
OBSTETRICS PRENATAL VIRTUAL VISIT ENCOUNTER NOTE  Provider location: [facility] at {Blank single:19197::"[facility]"}   Patient location: Home  I connected with Bonnie Graham on 06/30/23 at  9:55 AM EDT by MyChart Video Encounter and verified that I am speaking with the correct person using two identifiers. I discussed the limitations, risks, security and privacy concerns of performing an evaluation and management service virtually and the availability of in person appointments. I also discussed with the patient that there may be a patient responsible charge related to this service. The patient expressed understanding and agreed to proceed.  Subjective:  Bonnie Graham is a 27 y.o. [phone] at [redacted]w[redacted]d being seen today for ongoing prenatal care.  She is currently monitored for the following issues for this {Blank single:19197::"high-risk","low-risk"} pregnancy and has History of preterm delivery; Supervision of high risk pregnancy, antepartum, third trimester; IUGR (intrauterine growth restriction) affecting care of mother; and Anemia on their problem list.  Patient reports {sx:14538}.  Contractions: Not present. Vag. Bleeding: None.  Movement: Present. Denies any leaking of fluid.   The following portions of the patient's history were reviewed and updated as appropriate: allergies, current medications, past family history, past medical history, past social history, past surgical history and problem list.   Objective:  There were no vitals filed for this visit.  Fetal Status:     Movement: Present     General:  Alert, oriented and cooperative. Patient is in no acute distress.  Respiratory: Normal respiratory effort, no problems with respiration noted  Mental Status: Normal mood and affect. Normal behavior. Normal judgment and thought content.  Rest of  physical exam deferred due to type of encounter  Imaging: [place] MFM FETAL BPP WO NON STRESS  Result Date: 06/22/2023 ----------------------------------------------------------------------  OBSTETRICS REPORT                       (Signed Final 06/22/2023 04:15 pm) ---------------------------------------------------------------------- Patient Info  ID #:       [id number]                          D.O.B.:  Jan 24, 1996 (27 yrs)  Name:       [facility]               Visit Date: 06/22/2023 03:43 pm ---------------------------------------------------------------------- Performed By  Attending:        Braxton Feathers DO       Ref. Address:     [address]                                                             [place], [place]                                                             [address]  Performed By:     Anabel Halon          Location:         [facility]                                     [facility]  Referred By:      Bonnie [facility] CNM ---------------------------------------------------------------------- Orders  #  Description                           Code        Ordered By  1  [place] MFM FETAL BPP WO NON               76819.01    CORENTHIAN     STRESS                                            BOOKER  2  [place] MFM UA CORD DOPPLER                65784.69    Lin Landsman ----------------------------------------------------------------------  #  Order #                     Accession #                Episode #  1  [id number]                   [id number]                 [id number]  2  [id number]                   [id number]                 [id number] ---------------------------------------------------------------------- Indications  Maternal care for known or suspected  poor      O36.5930  fetal growth, third trimester, not applicable or  unspecified IUGR  [redacted] weeks gestation of pregnancy                Z3A.35  Encounter for other antenatal screening        Z36.2  follow-up  Poor obstetric history: Previous preterm       O09.219  delivery (33w, [redacted]w[redacted]d, 36w) ---------------------------------------------------------------------- Fetal Evaluation  Num Of Fetuses:         1  Fetal Heart Rate(bpm):  132  Cardiac Activity:       Observed  Presentation:           Cephalic  Placenta:               Posterior Fundal  P. Cord Insertion:      Previously seen  Amniotic Fluid  AFI FV:      Within normal limits  AFI Sum(cm)     %Tile       Largest Pocket(cm)  11.03           29          4.84  RUQ(cm)       RLQ(cm)       LUQ(cm)        LLQ(cm)  4.84          1.77          1.41           3.01 ---------------------------------------------------------------------- Biophysical Evaluation  Amniotic F.V:   Pocket => 2 cm             F. Tone:        Observed  F. Movement:    Observed                   Score:          8/8  F. Breathing:   Observed ---------------------------------------------------------------------- OB History  Gravidity:    4         Term:   0        Prem:   3        SAB:   0  TOP:          0       Ectopic:  0        Living: 3 ---------------------------------------------------------------------- Gestational Age  LMP:           35w 5d        Date:  10/15/22                  EDD:   07/22/23  Best:          35w 5d     Det. By:  LMP  (10/15/22)          EDD:   07/22/23 ---------------------------------------------------------------------- Doppler - Fetal Vessels  Umbilical Artery   S/D     %tile      RI    %tile      PI    %tile     PSV    ADFV    RDFV                                                     (cm/s)   2.81       74    0.64       77    1.04       86    43.74      No      No ---------------------------------------------------------------------- Comments  The patient is here for a  follow-up ultrasound for FGR at 35w  5d. EDD: 07/22/2023. Dating: LMP  (10/15/22). She has no  concerns  today.  Sonographic findings  Single intrauterine pregnancy.  Fetal cardiac activity:  Observed and appears normal.  Presentation: Cephalic.  Interval fetal anatomy appears normal.  Amniotic fluid volume: Within normal limits. AFI: 11.03 cm.  MVP: 4.84 cm.  Placenta: Posterior Fundal.  Umbilical artery dopplers findings:  -S/D:2.81 which are normal at this gestational age.  -Absent end-diastolic flow: No.  -Reversed end-diastolic flow:  No.  BPP 8/8.  Recommendations  - Continue weekly UA dopplers and antenatal testing until  delivery.  - Serial growth [place] every 3 weeks until delivery.  - Delivery will be based on growth [place] next week. ----------------------------------------------------------------------                  Braxton Feathers, DO Electronically Signed Final Report   06/22/2023 04:15 pm ----------------------------------------------------------------------   [place] MFM UA CORD DOPPLER  Result Date: 06/22/2023 ----------------------------------------------------------------------  OBSTETRICS REPORT                       (Signed Final 06/22/2023 04:15 pm) ---------------------------------------------------------------------- Patient Info  ID #:       [id number]                          D.O.B.:  05-22-1996 (27 yrs)  Name:       [facility]               Visit Date: 06/22/2023 03:43 pm ---------------------------------------------------------------------- Performed By  Attending:        Braxton Feathers DO       Ref. Address:     [address]                                                             [place], [place]                                                             [address]  Performed By:     Anabel Halon          Location:         [facility]                    [facility]                                     [facility] at                                                             [facility]  Referred By:      Bonnie [facility] CNM ---------------------------------------------------------------------- Orders  #  Description                           Code        Ordered By  1  [place] MFM FETAL BPP WO NON               76819.01    CORENTHIAN     STRESS                                            BOOKER  2  [place] MFM UA CORD DOPPLER                76820.02    Lin Landsman ----------------------------------------------------------------------  #  Order #                     Accession #                Episode #  1  [id number]                   [id number]                 [id number]  2  [id number]                   [id number]                 [id number] ---------------------------------------------------------------------- Indications  Maternal care for known or suspected poor      O36.5930  fetal growth, third trimester, not applicable or  unspecified IUGR  [redacted] weeks gestation of pregnancy                Z3A.35  Encounter for other antenatal screening        Z36.2  follow-up  Poor obstetric history: Previous preterm       O09.219  delivery (33w, [redacted]w[redacted]d, 36w) ---------------------------------------------------------------------- Fetal Evaluation  Num Of Fetuses:         1  Fetal Heart Rate(bpm):  132  Cardiac Activity:       Observed  Presentation:           Cephalic  Placenta:               Posterior Fundal  P. Cord Insertion:      Previously seen  Amniotic Fluid  AFI FV:      Within normal limits  AFI Sum(cm)     %Tile       Largest Pocket(cm)  11.03           29          4.84  RUQ(cm)       RLQ(cm)       LUQ(cm)        LLQ(cm)  4.84  1.77          1.41           3.01 ---------------------------------------------------------------------- Biophysical Evaluation  Amniotic F.V:   Pocket => 2 cm             F. Tone:        Observed  F. Movement:    Observed                   Score:           8/8  F. Breathing:   Observed ---------------------------------------------------------------------- OB History  Gravidity:    4         Term:   0        Prem:   3        SAB:   0  TOP:          0       Ectopic:  0        Living: 3 ---------------------------------------------------------------------- Gestational Age  LMP:           35w 5d        Date:  10/15/22                  EDD:   07/22/23  Best:          35w 5d     Det. By:  LMP  (10/15/22)          EDD:   07/22/23 ---------------------------------------------------------------------- Doppler - Fetal Vessels  Umbilical Artery   S/D     %tile      RI    %tile      PI    %tile     PSV    ADFV    RDFV                                                     (cm/s)   2.81       74    0.64       77    1.04       86    43.74      No      No ---------------------------------------------------------------------- Comments  The patient is here for a follow-up ultrasound for FGR at 35w  5d. EDD: 07/22/2023. Dating: LMP  (10/15/22). She has no  concerns today.  Sonographic findings  Single intrauterine pregnancy.  Fetal cardiac activity:  Observed and appears normal.  Presentation: Cephalic.  Interval fetal anatomy appears normal.  Amniotic fluid volume: Within normal limits. AFI: 11.03 cm.  MVP: 4.84 cm.  Placenta: Posterior Fundal.  Umbilical artery dopplers findings:  -S/D:2.81 which are normal at this gestational age.  -Absent end-diastolic flow: No.  -Reversed end-diastolic flow:  No.  BPP 8/8.  Recommendations  - Continue weekly UA dopplers and antenatal testing until  delivery.  - Serial growth [place] every 3 weeks until delivery.  - Delivery will be based on growth [place] next week. ----------------------------------------------------------------------                  Braxton Feathers, DO Electronically Signed Final Report   06/22/2023 04:15 pm ----------------------------------------------------------------------   [place] MFM UA CORD DOPPLER  Result Date:  06/16/2023 ----------------------------------------------------------------------  OBSTETRICS REPORT                       (  Signed Final 06/16/2023 04:51 pm) ---------------------------------------------------------------------- Patient Info  ID #:       [id number]                          D.O.B.:  Nov 21, 1995 (27 yrs)  Name:       [facility]               Visit Date: 06/16/2023 02:30 pm ---------------------------------------------------------------------- Performed By  Attending:        Braxton Feathers DO       Ref. Address:     [address]                                                             [place], [place]                                                             [address]  Performed By:     Marcellina Millin       Location:         [facility]                    RD[facility]                                     [facility]                                                             [facility]  Referred By:      Bonnie [facility] CNM ---------------------------------------------------------------------- Orders  #  Description                           Code        Ordered By  1  [place] MFM UA CORD DOPPLER                76820.02    Lin Landsman  2  [place]  MFM FETAL BPP                      53664.4     Michaelene Song ----------------------------------------------------------------------  #  Order #                     Accession #                Episode #  1  [id number]                   [id number]                 [id number]  2  [id number]                   [id number]                 [id number] ---------------------------------------------------------------------- Indications  Maternal care for known or suspected poor      O36.5930  fetal growth, third trimester, not applicable or  unspecified IUGR  Poor obstetric history:  Previous preterm       O09.219  delivery (33w, [redacted]w[redacted]d, 36w)  [redacted] weeks gestation of pregnancy                Z3A.34 ---------------------------------------------------------------------- Fetal Evaluation  Num Of Fetuses:         1  Fetal Heart Rate(bpm):  124  Cardiac Activity:       Observed  Presentation:           Cephalic  Placenta:               Posterior Fundal  P. Cord Insertion:      Previously visualized  Amniotic Fluid  AFI FV:      Within normal limits  AFI Sum(cm)     %Tile       Largest Pocket(cm)  12.12           36          5.5  RUQ(cm)       RLQ(cm)       LUQ(cm)        LLQ(cm)  5.5           4.54          0.61           1.47 ---------------------------------------------------------------------- Biophysical Evaluation  Amniotic F.V:   Pocket => 2 cm             F. Tone:        Observed  F. Movement:    Observed                   N.S.T:          Reactive  F. Breathing:   Observed                   Score:          10/10 ---------------------------------------------------------------------- OB History  Gravidity:    4         Term:   0        Prem:   3        SAB:   0  TOP:          0       Ectopic:  0        Living: 3 ---------------------------------------------------------------------- Gestational Age  LMP:           34w 6d        Date:  10/15/22                  EDD:   07/22/23  Best:          34w 6d     Det. By:  LMP  (10/15/22)          EDD:   07/22/23 ---------------------------------------------------------------------- Doppler - Fetal Vessels  Umbilical Artery   S/D     %tile      RI    %tile      PI    %tile     PSV    ADFV    RDFV                                                     (cm/s)   2.78       69    0.64       74    0.98       76    47.44      No      No ---------------------------------------------------------------------- Comments  The patient is here for a follow-up ultrasound for FGR at 34w  6d. EDD: 07/22/2023. Dating: LMP  (10/15/22). She has no  concerns today and reports normal  fetal movement.  Sonographic findings  Single intrauterine pregnancy.  Fetal cardiac activity:  Observed and appears normal.  Presentation: Cephalic.  Interval fetal anatomy appears normal.  Amniotic fluid volume: Within normal limits. AFI: 12.12 cm.  MVP: 5.5 cm.  Placenta: Posterior Fundal.  Umbilical artery dopplers findings:  -S/D: 2.78 which are normal at this gestational age.  -Absent end-diastolic flow: No.  -Reversed end-diastolic flow:  No.  BPP 10/10.  Recommendations  - NST was added due to concern for a low fetal HR baseline  but was normal without decels. Continue weekly UA dopplers  and antenatal testing until delivery.  - Serial growth [place] every 3 weeks until delivery.  - Delivery likely around 38 to 39 weeks or sooner if indicated. ----------------------------------------------------------------------                 Braxton Feathers, DO Electronically Signed Final Report   06/16/2023 04:51 pm ----------------------------------------------------------------------   [place] MFM FETAL BPP W/NONSTRESS  Result Date: 06/16/2023 ----------------------------------------------------------------------  OBSTETRICS REPORT                       (Signed Final 06/16/2023 04:51 pm) ---------------------------------------------------------------------- Patient Info  ID #:       [id number]                          D.O.B.:  August 28, 1996 (27 yrs)  Name:       [facility]               Visit Date: 06/16/2023 02:30 pm ---------------------------------------------------------------------- Performed By  Attending:        Braxton Feathers DO       Ref. Address:     [address]  [place], [place]                                                             [address]  Performed By:     Marcellina Millin       Location:         [facility]                                     [facility]  Referred By:      Bonnie [facility] CNM ---------------------------------------------------------------------- Orders  #  Description                           Code        Ordered By  1  [place] MFM UA CORD DOPPLER                76820.02    Lin Landsman  2  [place] MFM FETAL BPP                      19147.8     Michaelene Song ----------------------------------------------------------------------  #  Order #                     Accession #                Episode #  1  [id number]                   [id number]                 [id number]  2  [id number]  [id number]                 [id number] ---------------------------------------------------------------------- Indications  Maternal care for known or suspected poor      O36.5930  fetal growth, third trimester, not applicable or  unspecified IUGR  Poor obstetric history: Previous preterm       O09.219  delivery (33w, [redacted]w[redacted]d, 36w)  [redacted] weeks gestation of pregnancy                Z3A.34 ---------------------------------------------------------------------- Fetal Evaluation  Num Of Fetuses:         1  Fetal Heart Rate(bpm):  124  Cardiac Activity:       Observed  Presentation:           Cephalic  Placenta:               Posterior Fundal  P. Cord Insertion:      Previously visualized  Amniotic Fluid  AFI FV:      Within normal limits  AFI Sum(cm)     %Tile       Largest Pocket(cm)  12.12           36          5.5  RUQ(cm)       RLQ(cm)       LUQ(cm)        LLQ(cm)  5.5           4.54          0.61           1.47 ---------------------------------------------------------------------- Biophysical Evaluation  Amniotic F.V:   Pocket => 2 cm             F. Tone:        Observed  F. Movement:    Observed                   N.S.T:          Reactive  F. Breathing:   Observed                    Score:          10/10 ---------------------------------------------------------------------- OB History  Gravidity:    4         Term:   0        Prem:   3        SAB:   0  TOP:          0       Ectopic:  0        Living: 3 ---------------------------------------------------------------------- Gestational Age  LMP:           34w 6d        Date:  10/15/22                  EDD:   07/22/23  Best:          34w 6d     Det. By:  LMP  (10/15/22)          EDD:   07/22/23 ---------------------------------------------------------------------- Doppler - Fetal Vessels  Umbilical Artery   S/D     %tile      RI    %tile      PI    %tile     PSV    ADFV    RDFV                                                     (  cm/s)   2.78       69    0.64       74    0.98       76    47.44      No      No ---------------------------------------------------------------------- Comments  The patient is here for a follow-up ultrasound for FGR at 34w  6d. EDD: 07/22/2023. Dating: LMP  (10/15/22). She has no  concerns today and reports normal fetal movement.  Sonographic findings  Single intrauterine pregnancy.  Fetal cardiac activity:  Observed and appears normal.  Presentation: Cephalic.  Interval fetal anatomy appears normal.  Amniotic fluid volume: Within normal limits. AFI: 12.12 cm.  MVP: 5.5 cm.  Placenta: Posterior Fundal.  Umbilical artery dopplers findings:  -S/D: 2.78 which are normal at this gestational age.  -Absent end-diastolic flow: No.  -Reversed end-diastolic flow:  No.  BPP 10/10.  Recommendations  - NST was added due to concern for a low fetal HR baseline  but was normal without decels. Continue weekly UA dopplers  and antenatal testing until delivery.  - Serial growth [place] every 3 weeks until delivery.  - Delivery likely around 38 to 39 weeks or sooner if indicated. ----------------------------------------------------------------------                 Braxton Feathers, DO Electronically Signed Final Report   06/16/2023 04:51 pm  ----------------------------------------------------------------------   [place] MFM FETAL BPP WO NON STRESS  Result Date: 06/10/2023 ----------------------------------------------------------------------  OBSTETRICS REPORT                       (Signed Final 06/10/2023 04:31 pm) ---------------------------------------------------------------------- Patient Info  ID #:       [id number]                          D.O.B.:  01-Feb-1996 (27 yrs)  Name:       [facility]               Visit Date: 06/10/2023 03:37 pm ---------------------------------------------------------------------- Performed By  Attending:        Lin Landsman      Ref. Address:     [address]                    [place]                                                             [place], [place]                                                             [address]  Performed By:     Earley Brooke     Location:         [facility]                    [facility]  [facility] at                                                             [facility]  Referred By:      Bonnie [facility] CNM ---------------------------------------------------------------------- Orders  #  Description                           Code        Ordered By  1  [place] MFM FETAL BPP WO NON               76819.01    CORENTHIAN     STRESS                                            BOOKER  2  [place] MFM OB FOLLOW UP                   76816.01    Lin Landsman  3  [place] MFM UA CORD DOPPLER                16109.60    Lin Landsman ----------------------------------------------------------------------  #  Order #                     Accession #                Episode #  1  [id number]                   [id number]                 [id number]  2  [id number]                    [id number]                 [id number]  3  [id number]                   [id number]  [id number] ---------------------------------------------------------------------- Indications  Maternal care for known or suspected poor      O36.5930  fetal growth, third trimester, not applicable or  unspecified IUGR  [redacted] weeks gestation of pregnancy                Z3A.34  Poor obstetric history: Previous preterm       O09.219  delivery (33w, [redacted]w[redacted]d, 36w) ---------------------------------------------------------------------- Fetal Evaluation  Num Of Fetuses:         1  Fetal Heart Rate(bpm):  135  Cardiac Activity:       Observed  Presentation:           Cephalic  Placenta:               Posterior Fundal  P. Cord Insertion:      Previously visualized  Amniotic Fluid  AFI FV:      Within normal limits  AFI Sum(cm)     %Tile       Largest Pocket(cm)  13.48           45          4.09  RUQ(cm)       RLQ(cm)       LUQ(cm)        LLQ(cm)  4.09          2.87          3.91           2.61 ---------------------------------------------------------------------- Biophysical Evaluation  Amniotic F.V:   Within normal limits       F. Tone:        Observed  F. Movement:    Observed                   Score:          8/8  F. Breathing:   Observed ---------------------------------------------------------------------- Biometry  BPD:      84.9  mm     G. Age:  34w 1d         54  %    CI:        79.77   %    70 - 86                                                          FL/HC:      20.6   %    19.4 - 21.8  HC:      300.4  mm     G. Age:  33w 2d          7  %    HC/AC:      1.07        0.96 - 1.11  AC:      280.4  mm     G. Age:  32w 1d          8  %    FL/BPD:     73.0   %    71 - 87  FL:         62  mm     G. Age:  32w 1d          6  %    FL/AC:      22.1   %    20 - 24  HUM:      54.3  mm     G. Age:  31w 4d          8  %  Est. FW:    1967  gm      4 lb 5 oz      9  % ---------------------------------------------------------------------- OB  History  Gravidity:    4         Term:   0        Prem:   3        SAB:   0  TOP:          0       Ectopic:  0        Living: 3 ---------------------------------------------------------------------- Gestational Age  LMP:           34w 0d        Date:  10/15/22                  EDD:   07/22/23  U/S Today:     33w 0d                                        EDD:   07/29/23  Best:          34w 0d     Det. By:  LMP  (10/15/22)          EDD:   07/22/23 ---------------------------------------------------------------------- Anatomy  Cranium:               Appears normal         LVOT:                   Previously seen  Cavum:                 Previously seen        Aortic Arch:            Previously seen  Ventricles:            Previously seen        Ductal Arch:            Previously seen  Choroid Plexus:        Previously seen        Diaphragm:              Appears normal  Cerebellum:            Previously seen        Stomach:                Appears normal, left                                                                        sided  Posterior Fossa:       Previously seen        Abdomen:                Appears normal  Nuchal Fold:           Previously seen  Abdominal Wall:         Previously seen  Face:                  Orbits and profile     Cord Vessels:           Previously seen                         previously seen  Lips:                  Previously seen        Kidneys:                Appear normal  Palate:                Previously seen        Bladder:                Appears normal  Thoracic:              Previously seen        Spine:                  Previously seen  Heart:                 Appears normal         Upper Extremities:      Previously seen                         (4CH, axis, and                         situs)  RVOT:                  Previously seen        Lower Extremities:      Previously seen  Other:  Female gender previously seen. IVC/SVC, hands, heels/feet, Lenses          previously visualized.  Nasal bone, VC, 3VV and 3VTV prev visualized. ---------------------------------------------------------------------- Doppler - Fetal Vessels  Umbilical Artery   S/D     %tile      RI    %tile      PI    %tile     PSV    ADFV    RDFV                                                     (cm/s)   3.25       85    0.69       87    1.13       91    46.03      No      No ---------------------------------------------------------------------- Cervix Uterus Adnexa  Cervix  Not visualized (advanced GA >24wks) ---------------------------------------------------------------------- Impression  Follow up growth due to known FGR  Normal interval growth with measurements consistent with  dates  Good fetal movement and amniotic fluid volume  The UA Dopplers is normal without evidence of AEDF or  REDF.  BPP 8/8 ---------------------------------------------------------------------- Recommendations  Continue weekly testing with UA Dopplers  Repeat growth in 3-4 weeks. ----------------------------------------------------------------------  Lin Landsman, MD Electronically Signed Final Report   06/10/2023 04:31 pm ----------------------------------------------------------------------   [place] MFM OB FOLLOW UP  Result Date: 06/10/2023 ----------------------------------------------------------------------  OBSTETRICS REPORT                       (Signed Final 06/10/2023 04:31 pm) ---------------------------------------------------------------------- Patient Info  ID #:       [id number]                          D.O.B.:  1996-08-28 (27 yrs)  Name:       [facility]               Visit Date: 06/10/2023 03:37 pm ---------------------------------------------------------------------- Performed By  Attending:        Lin Landsman      Ref. Address:     [address]                    [place]                                                             [place], [place]                                                             [address]   Performed By:     Earley Brooke     Location:         [facility]                    BS, RDMS                                 [facility]  Referred By:      Bonnie [facility] CNM ---------------------------------------------------------------------- Orders  #  Description                           Code        Ordered By  1  [place] MFM FETAL BPP WO NON  96045.40    CORENTHIAN     STRESS                                            BOOKER  2  [place] MFM OB FOLLOW UP                   98119.14    Lin Landsman  3  [place] MFM UA CORD DOPPLER                78295.62    Lin Landsman ----------------------------------------------------------------------  #  Order #                     Accession #                Episode #  1  [id number]                   [id number]                 [id number]  2  [id number]                   [id number]                 [id number]  3  [id number]                   [id number]                 [id number] ---------------------------------------------------------------------- Indications  Maternal care for known or suspected poor      O36.5930  fetal growth, third trimester, not applicable or  unspecified IUGR  [redacted] weeks gestation of pregnancy                Z3A.34  Poor obstetric history: Previous preterm       O09.219  delivery (33w, [redacted]w[redacted]d, 36w) ---------------------------------------------------------------------- Fetal Evaluation  Num Of Fetuses:         1  Fetal Heart Rate(bpm):  135  Cardiac Activity:       Observed  Presentation:           Cephalic  Placenta:               Posterior Fundal  P. Cord Insertion:      Previously visualized  Amniotic Fluid  AFI FV:      Within normal limits  AFI Sum(cm)     %Tile        Largest Pocket(cm)  13.48           45          4.09  RUQ(cm)       RLQ(cm)       LUQ(cm)  LLQ(cm)  4.09          2.87          3.91           2.61 ---------------------------------------------------------------------- Biophysical Evaluation  Amniotic F.V:   Within normal limits       F. Tone:        Observed  F. Movement:    Observed                   Score:          8/8  F. Breathing:   Observed ---------------------------------------------------------------------- Biometry  BPD:      84.9  mm     G. Age:  34w 1d         54  %    CI:        79.77   %    70 - 86                                                          FL/HC:      20.6   %    19.4 - 21.8  HC:      300.4  mm     G. Age:  33w 2d          7  %    HC/AC:      1.07        0.96 - 1.11  AC:      280.4  mm     G. Age:  32w 1d          8  %    FL/BPD:     73.0   %    71 - 87  FL:         62  mm     G. Age:  32w 1d          6  %    FL/AC:      22.1   %    20 - 24  HUM:      54.3  mm     G. Age:  31w 4d          8  %  Est. FW:    1967  gm      4 lb 5 oz      9  % ---------------------------------------------------------------------- OB History  Gravidity:    4         Term:   0        Prem:   3        SAB:   0  TOP:          0       Ectopic:  0        Living: 3 ---------------------------------------------------------------------- Gestational Age  LMP:           34w 0d        Date:  10/15/22                  EDD:   07/22/23  U/S Today:     33w 0d  EDD:   07/29/23  Best:          34w 0d     Det. By:  LMP  (10/15/22)          EDD:   07/22/23 ---------------------------------------------------------------------- Anatomy  Cranium:               Appears normal         LVOT:                   Previously seen  Cavum:                 Previously seen        Aortic Arch:            Previously seen  Ventricles:            Previously seen        Ductal Arch:            Previously seen  Choroid Plexus:        Previously seen         Diaphragm:              Appears normal  Cerebellum:            Previously seen        Stomach:                Appears normal, left                                                                        sided  Posterior Fossa:       Previously seen        Abdomen:                Appears normal  Nuchal Fold:           Previously seen        Abdominal Wall:         Previously seen  Face:                  Orbits and profile     Cord Vessels:           Previously seen                         previously seen  Lips:                  Previously seen        Kidneys:                Appear normal  Palate:                Previously seen        Bladder:                Appears normal  Thoracic:              Previously seen        Spine:                  Previously seen  Heart:  Appears normal         Upper Extremities:      Previously seen                         (4CH, axis, and                         situs)  RVOT:                  Previously seen        Lower Extremities:      Previously seen  Other:  Female gender previously seen. IVC/SVC, hands, heels/feet, Lenses          previously visualized. Nasal bone, VC, 3VV and 3VTV prev visualized. ---------------------------------------------------------------------- Doppler - Fetal Vessels  Umbilical Artery   S/D     %tile      RI    %tile      PI    %tile     PSV    ADFV    RDFV                                                     (cm/s)   3.25       85    0.69       87    1.13       91    46.03      No      No ---------------------------------------------------------------------- Cervix Uterus Adnexa  Cervix  Not visualized (advanced GA >24wks) ---------------------------------------------------------------------- Impression  Follow up growth due to known FGR  Normal interval growth with measurements consistent with  dates  Good fetal movement and amniotic fluid volume  The UA Dopplers is normal without evidence of AEDF or  REDF.  BPP 8/8  ---------------------------------------------------------------------- Recommendations  Continue weekly testing with UA Dopplers  Repeat growth in 3-4 weeks. ----------------------------------------------------------------------              Lin Landsman, MD Electronically Signed Final Report   06/10/2023 04:31 pm ----------------------------------------------------------------------   [place] MFM UA CORD DOPPLER  Result Date: 06/10/2023 ----------------------------------------------------------------------  OBSTETRICS REPORT                       (Signed Final 06/10/2023 04:31 pm) ---------------------------------------------------------------------- Patient Info  ID #:       [id number]                          D.O.B.:  1995-12-03 (27 yrs)  Name:       [facility]               Visit Date: 06/10/2023 03:37 pm ---------------------------------------------------------------------- Performed By  Attending:        Lin Landsman      Ref. Address:     [address]                    [place]  [place], [place]                                                             [address]  Performed By:     Earley Brooke     Location:         [facility]                    BS, RDMS                                 [facility]  Referred By:      Bonnie [facility] CNM ---------------------------------------------------------------------- Orders  #  Description                           Code        Ordered By  1  [place] MFM FETAL BPP WO NON               76819.01    CORENTHIAN     STRESS                                            BOOKER  2  [place] MFM OB FOLLOW UP                   76816.01    Lin Landsman  3  [place] MFM UA CORD DOPPLER                19147.82     Lin Landsman ----------------------------------------------------------------------  #  Order #  Accession #                Episode #  1  [id number]                   [id number]                 [id number]  2  [id number]                   [id number]                 [id number]  3  [id number]                   [id number]                 [id number] ---------------------------------------------------------------------- Indications  Maternal care for known or suspected poor      O36.5930  fetal growth, third trimester, not applicable or  unspecified IUGR  [redacted] weeks gestation of pregnancy                Z3A.34  Poor obstetric history: Previous preterm       O09.219  delivery (33w, [redacted]w[redacted]d, 36w) ---------------------------------------------------------------------- Fetal Evaluation  Num Of Fetuses:         1  Fetal Heart Rate(bpm):  135  Cardiac Activity:       Observed  Presentation:           Cephalic  Placenta:               Posterior Fundal  P. Cord Insertion:      Previously visualized  Amniotic Fluid  AFI FV:      Within normal limits  AFI Sum(cm)     %Tile       Largest Pocket(cm)  13.48           45          4.09  RUQ(cm)       RLQ(cm)       LUQ(cm)        LLQ(cm)  4.09          2.87          3.91           2.61 ---------------------------------------------------------------------- Biophysical Evaluation  Amniotic F.V:   Within normal limits       F. Tone:        Observed  F. Movement:    Observed                   Score:          8/8  F. Breathing:   Observed ---------------------------------------------------------------------- Biometry  BPD:      84.9  mm     G. Age:  34w 1d         54  %    CI:        79.77   %    70 - 86                                                          FL/HC:      20.6   %    19.4 - 21.8  HC:      300.4  mm     G.  Age:  33w 2d          7  %    HC/AC:      1.07        0.96 - 1.11  AC:      280.4  mm     G. Age:  32w 1d           8  %    FL/BPD:     73.0   %    71 - 87  FL:         62  mm     G. Age:  32w 1d          6  %    FL/AC:      22.1   %    20 - 24  HUM:      54.3  mm     G. Age:  31w 4d          8  %  Est. FW:    1967  gm      4 lb 5 oz      9  % ---------------------------------------------------------------------- OB History  Gravidity:    4         Term:   0        Prem:   3        SAB:   0  TOP:          0       Ectopic:  0        Living: 3 ---------------------------------------------------------------------- Gestational Age  LMP:           34w 0d        Date:  10/15/22                  EDD:   07/22/23  U/S Today:     33w 0d                                        EDD:   07/29/23  Best:          34w 0d     Det. By:  LMP  (10/15/22)          EDD:   07/22/23 ---------------------------------------------------------------------- Anatomy  Cranium:               Appears normal         LVOT:                   Previously seen  Cavum:                 Previously seen        Aortic Arch:            Previously seen  Ventricles:            Previously seen        Ductal Arch:            Previously seen  Choroid Plexus:        Previously seen        Diaphragm:              Appears normal  Cerebellum:            Previously seen        Stomach:  Appears normal, left                                                                        sided  Posterior Fossa:       Previously seen        Abdomen:                Appears normal  Nuchal Fold:           Previously seen        Abdominal Wall:         Previously seen  Face:                  Orbits and profile     Cord Vessels:           Previously seen                         previously seen  Lips:                  Previously seen        Kidneys:                Appear normal  Palate:                Previously seen        Bladder:                Appears normal  Thoracic:              Previously seen        Spine:                  Previously seen  Heart:                 Appears normal          Upper Extremities:      Previously seen                         (4CH, axis, and                         situs)  RVOT:                  Previously seen        Lower Extremities:      Previously seen  Other:  Female gender previously seen. IVC/SVC, hands, heels/feet, Lenses          previously visualized. Nasal bone, VC, 3VV and 3VTV prev visualized. ---------------------------------------------------------------------- Doppler - Fetal Vessels  Umbilical Artery   S/D     %tile      RI    %tile      PI    %tile     PSV    ADFV    RDFV                                                     (  cm/s)   3.25       85    0.69       87    1.13       91    46.03      No      No ---------------------------------------------------------------------- Cervix Uterus Adnexa  Cervix  Not visualized (advanced GA >24wks) ---------------------------------------------------------------------- Impression  Follow up growth due to known FGR  Normal interval growth with measurements consistent with  dates  Good fetal movement and amniotic fluid volume  The UA Dopplers is normal without evidence of AEDF or  REDF.  BPP 8/8 ---------------------------------------------------------------------- Recommendations  Continue weekly testing with UA Dopplers  Repeat growth in 3-4 weeks. ----------------------------------------------------------------------              Lin Landsman, MD Electronically Signed Final Report   06/10/2023 04:31 pm ----------------------------------------------------------------------    Assessment and Plan:  Pregnancy: [id number] at [redacted]w[redacted]d 1. Encounter for supervision of low-risk pregnancy in third trimester - Doing well, feeling regular and vigorous fetal movement   2. [redacted] weeks gestation of pregnancy - Will have regular OB visit tomorrow with GBS testing - Pt is interested in waterbirth.  Potentially contraindicated for FGR<10%ile, was 9%ile on last U/S with repeat growth scan today.  - Pt has taken the  waterbirth class and will upload cert today - Discussed waterbirth as option for low-risk pregnancy.  Reviewed conditions that may arise during pregnancy that will risk pt out of waterbirth including hypertension, diabetes, fetal growth restriction <10%ile, etc.   3. Poor fetal growth affecting management of mother in third trimester, single or unspecified fetus - Has repeat growth today, will review report tonight - Reviewed other methods of pain relief and reassured she will still be able to be mobile, use a saline lock when appropriate, and shower. Pt expressed understanding re potential risk out for waterbirth.   Preterm labor symptoms and general obstetric precautions including but not limited to vaginal bleeding, contractions, leaking of fluid and fetal movement were reviewed in detail with the patient. I discussed the assessment and treatment plan with the patient. The patient was provided an opportunity to ask questions and all were answered. The patient agreed with the plan and demonstrated an understanding of the instructions. The patient was advised to call back or seek an in-person office evaluation/go to [facility] at [facility] for any urgent or concerning symptoms. Please refer to After Visit Summary for other counseling recommendations.   I provided 10 minutes of face-to-face time during this encounter.  Return in about 1 day (around 07/01/2023) for LOB.  Future Appointments  Date Time Provider Department [facility]  06/30/2023  2:30 PM [facility] US3 [facility]  07/01/2023  8:10 AM Conan Bowens, MD [facility]  07/08/2023  8:10 AM Milas Hock, MD [facility]  07/08/2023  1:30 PM [facility]  07/08/2023  1:45 PM [facility] US4 [facility]  07/15/2023 10:50 AM Milas Hock, MD [facility]    Bernerd Limbo, CNM [facility] [employer], [facility]

## 2023-06-29 NOTE — Telephone Encounter (Addendum)
Needs water birth discussion with midwife. Scheduled for tomorrow, 06/30/23.

## 2023-06-30 ENCOUNTER — Telehealth (INDEPENDENT_AMBULATORY_CARE_PROVIDER_SITE_OTHER): Payer: Medicaid Other | Admitting: Certified Nurse Midwife

## 2023-06-30 ENCOUNTER — Ambulatory Visit: Payer: Medicaid Other | Admitting: *Deleted

## 2023-06-30 ENCOUNTER — Other Ambulatory Visit: Payer: Self-pay | Admitting: Maternal & Fetal Medicine

## 2023-06-30 ENCOUNTER — Encounter: Payer: Self-pay | Admitting: Certified Nurse Midwife

## 2023-06-30 ENCOUNTER — Ambulatory Visit (HOSPITAL_BASED_OUTPATIENT_CLINIC_OR_DEPARTMENT_OTHER): Payer: Medicaid Other

## 2023-06-30 DIAGNOSIS — O365990 Maternal care for other known or suspected poor fetal growth, unspecified trimester, not applicable or unspecified: Secondary | ICD-10-CM | POA: Insufficient documentation

## 2023-06-30 DIAGNOSIS — Z3A36 36 weeks gestation of pregnancy: Secondary | ICD-10-CM

## 2023-06-30 DIAGNOSIS — O365930 Maternal care for other known or suspected poor fetal growth, third trimester, not applicable or unspecified: Secondary | ICD-10-CM

## 2023-06-30 DIAGNOSIS — O09213 Supervision of pregnancy with history of pre-term labor, third trimester: Secondary | ICD-10-CM

## 2023-06-30 DIAGNOSIS — Z3493 Encounter for supervision of normal pregnancy, unspecified, third trimester: Secondary | ICD-10-CM

## 2023-06-30 NOTE — Procedures (Signed)
Bonnie [facility] 1996-09-22 [redacted]w[redacted]d  Fetus A Non-Stress Test Interpretation for 06/30/23  Indication: IUGR, elevated dopplers  Fetal Heart Rate A Mode: External Baseline Rate (A): 130 bpm Variability: Moderate Accelerations: 15 x 15 Decelerations: None Multiple birth?: No  Uterine Activity Mode: Toco Contraction Frequency (min): none Resting Tone Palpated: Relaxed  Interpretation (Fetal Testing) Nonstress Test Interpretation: Reactive Comments: Tra ing reviewed by Dr. Grace Bushy

## 2023-07-01 ENCOUNTER — Inpatient Hospital Stay (HOSPITAL_COMMUNITY)
Admission: AD | Admit: 2023-07-01 | Discharge: 2023-07-02 | DRG: 807 | Disposition: A | Discharge status: Home / Self Care | Payer: Medicaid Other | Attending: Obstetrics and Gynecology | Admitting: Obstetrics and Gynecology

## 2023-07-01 ENCOUNTER — Other Ambulatory Visit: Payer: Self-pay

## 2023-07-01 ENCOUNTER — Encounter (HOSPITAL_COMMUNITY): Payer: Self-pay | Admitting: Obstetrics and Gynecology

## 2023-07-01 ENCOUNTER — Inpatient Hospital Stay (HOSPITAL_COMMUNITY): Payer: Medicaid Other

## 2023-07-01 ENCOUNTER — Inpatient Hospital Stay (HOSPITAL_COMMUNITY): Payer: Medicaid Other | Admitting: Anesthesiology

## 2023-07-01 ENCOUNTER — Encounter: Payer: Medicaid Other | Admitting: Obstetrics and Gynecology

## 2023-07-01 DIAGNOSIS — O0993 Supervision of high risk pregnancy, unspecified, third trimester: Secondary | ICD-10-CM

## 2023-07-01 DIAGNOSIS — Z3A37 37 weeks gestation of pregnancy: Secondary | ICD-10-CM | POA: Diagnosis not present

## 2023-07-01 DIAGNOSIS — O365930 Maternal care for other known or suspected poor fetal growth, third trimester, not applicable or unspecified: Principal | ICD-10-CM | POA: Diagnosis present

## 2023-07-01 DIAGNOSIS — O9902 Anemia complicating childbirth: Secondary | ICD-10-CM | POA: Diagnosis present

## 2023-07-01 DIAGNOSIS — Z87898 Personal history of other specified conditions: Secondary | ICD-10-CM | POA: Diagnosis present

## 2023-07-01 DIAGNOSIS — Z3490 Encounter for supervision of normal pregnancy, unspecified, unspecified trimester: Principal | ICD-10-CM | POA: Diagnosis present

## 2023-07-01 DIAGNOSIS — O365990 Maternal care for other known or suspected poor fetal growth, unspecified trimester, not applicable or unspecified: Secondary | ICD-10-CM | POA: Diagnosis present

## 2023-07-01 DIAGNOSIS — D649 Anemia, unspecified: Secondary | ICD-10-CM | POA: Diagnosis present

## 2023-07-01 DIAGNOSIS — Z8751 Personal history of pre-term labor: Secondary | ICD-10-CM

## 2023-07-01 LAB — CBC
HCT: 30.5 % — ABNORMAL LOW (ref 36.0–46.0)
Hemoglobin: 9.7 g/dL — ABNORMAL LOW (ref 12.0–15.0)
MCH: 26.5 pg (ref 26.0–34.0)
MCHC: 31.8 g/dL (ref 30.0–36.0)
MCV: 83.3 fL (ref 80.0–100.0)
Platelets: 356 10*3/uL (ref 150–400)
RBC: 3.66 MIL/uL — ABNORMAL LOW (ref 3.87–5.11)
RDW: 15 % (ref 11.5–15.5)
WBC: 14 10*3/uL — ABNORMAL HIGH (ref 4.0–10.5)
nRBC: 0 % (ref 0.0–0.2)

## 2023-07-01 LAB — TYPE AND SCREEN
ABO/RH(D): A POS
Antibody Screen: NEGATIVE

## 2023-07-01 LAB — RPR: RPR Ser Ql: NONREACTIVE

## 2023-07-01 MED ORDER — BENZOCAINE-MENTHOL 20-0.5 % EX AERO: 1.0000 | INHALATION_SPRAY | CUTANEOUS | Status: DC | PRN

## 2023-07-01 MED ORDER — FENTANYL-BUPIVACAINE-NACL 0.5-0.125-0.9 MG/250ML-% EP SOLN: 12.0000 mL/h | EPIDURAL | Status: DC | PRN

## 2023-07-01 MED ORDER — DIPHENHYDRAMINE HCL 50 MG/ML IJ SOLN: 12.5000 mg | INTRAMUSCULAR | Status: DC | PRN

## 2023-07-01 MED ORDER — OXYTOCIN BOLUS FROM INFUSION
333.0000 mL | Freq: Once | INTRAVENOUS | Status: AC
  Administered 2023-07-01: 333 mL via INTRAVENOUS

## 2023-07-01 MED ORDER — PHENYLEPHRINE 80 MCG/ML (10ML) SYRINGE FOR IV PUSH (FOR BLOOD PRESSURE SUPPORT): 80.0000 ug | PREFILLED_SYRINGE | INTRAVENOUS | Status: DC | PRN

## 2023-07-01 MED ORDER — ONDANSETRON HCL 4 MG/2ML IJ SOLN: 4.0000 mg | Freq: Four times a day (QID) | INTRAMUSCULAR | Status: DC | PRN

## 2023-07-01 MED ORDER — SOD CITRATE-CITRIC ACID 500-334 MG/5ML PO SOLN: 30.0000 mL | ORAL | Status: DC | PRN

## 2023-07-01 MED ORDER — SIMETHICONE 80 MG PO CHEW: 80.0000 mg | CHEWABLE_TABLET | ORAL | Status: DC | PRN

## 2023-07-01 MED ORDER — PRENATAL MULTIVITAMIN CH
1.0000 | ORAL_TABLET | Freq: Every day | ORAL | Status: DC
  Administered 2023-07-02: 1 via ORAL

## 2023-07-01 MED ORDER — LIDOCAINE HCL (PF) 1 % IJ SOLN
INTRAMUSCULAR | Status: DC | PRN
  Administered 2023-07-01: 5 mL via EPIDURAL

## 2023-07-01 MED ORDER — LACTATED RINGERS IV SOLN: INTRAVENOUS | Status: DC

## 2023-07-01 MED ORDER — ACETAMINOPHEN 325 MG PO TABS
650.0000 mg | ORAL_TABLET | ORAL | Status: DC | PRN
  Administered 2023-07-01 – 2023-07-02 (×4): 650 mg via ORAL
  Filled 2023-07-01 (×4): qty 2

## 2023-07-01 MED ORDER — ZOLPIDEM TARTRATE 5 MG PO TABS: 5.0000 mg | ORAL_TABLET | Freq: Every evening | ORAL | Status: DC | PRN

## 2023-07-01 MED ORDER — OXYCODONE-ACETAMINOPHEN 5-325 MG PO TABS: 2.0000 | ORAL_TABLET | ORAL | Status: DC | PRN

## 2023-07-01 MED ORDER — EPHEDRINE 5 MG/ML INJ: 10.0000 mg | INTRAVENOUS | Status: DC | PRN

## 2023-07-01 MED ORDER — OXYTOCIN-SODIUM CHLORIDE 30-0.9 UT/500ML-% IV SOLN
1.0000 m[IU]/min | INTRAVENOUS | Status: DC
  Administered 2023-07-01: 2 m[IU]/min via INTRAVENOUS

## 2023-07-01 MED ORDER — DIPHENHYDRAMINE HCL 25 MG PO CAPS: 25.0000 mg | ORAL_CAPSULE | Freq: Four times a day (QID) | ORAL | Status: DC | PRN

## 2023-07-01 MED ORDER — FERROUS SULFATE 325 (65 FE) MG PO TABS
325.0000 mg | ORAL_TABLET | ORAL | Status: DC
  Administered 2023-07-02: 325 mg via ORAL
  Filled 2023-07-01: qty 1

## 2023-07-01 MED ORDER — ONDANSETRON HCL 4 MG PO TABS: 4.0000 mg | ORAL_TABLET | ORAL | Status: DC | PRN

## 2023-07-01 MED ORDER — DIBUCAINE (PERIANAL) 1 % EX OINT: 1.0000 | TOPICAL_OINTMENT | CUTANEOUS | Status: DC | PRN

## 2023-07-01 MED ORDER — SENNOSIDES-DOCUSATE SODIUM 8.6-50 MG PO TABS
2.0000 | ORAL_TABLET | Freq: Every day | ORAL | Status: DC
  Administered 2023-07-02: 2 via ORAL
  Filled 2023-07-01: qty 2

## 2023-07-01 MED ORDER — TERBUTALINE SULFATE 1 MG/ML IJ SOLN: 0.2500 mg | Freq: Once | INTRAMUSCULAR | Status: DC | PRN

## 2023-07-01 MED ORDER — LACTATED RINGERS IV SOLN: 500.0000 mL | Freq: Once | INTRAVENOUS | Status: DC

## 2023-07-01 MED ORDER — WITCH HAZEL-GLYCERIN EX PADS: 1.0000 | MEDICATED_PAD | CUTANEOUS | Status: DC | PRN

## 2023-07-01 MED ORDER — LACTATED RINGERS IV SOLN: 500.0000 mL | INTRAVENOUS | Status: DC | PRN

## 2023-07-01 MED ORDER — OXYCODONE-ACETAMINOPHEN 5-325 MG PO TABS: 1.0000 | ORAL_TABLET | ORAL | Status: DC | PRN

## 2023-07-01 MED ORDER — OXYCODONE HCL 5 MG PO TABS
5.0000 mg | ORAL_TABLET | ORAL | Status: DC | PRN
  Administered 2023-07-01 – 2023-07-02 (×2): 5 mg via ORAL
  Filled 2023-07-01 (×2): qty 1

## 2023-07-01 MED ORDER — ONDANSETRON HCL 4 MG/2ML IJ SOLN: 4.0000 mg | INTRAMUSCULAR | Status: DC | PRN

## 2023-07-01 MED ORDER — FENTANYL-BUPIVACAINE-NACL 0.5-0.125-0.9 MG/250ML-% EP SOLN
EPIDURAL | Status: AC
  Filled 2023-07-01: qty 250

## 2023-07-01 MED ORDER — TETANUS-DIPHTH-ACELL PERTUSSIS 5-2.5-18.5 LF-MCG/0.5 IM SUSY: 0.5000 mL | PREFILLED_SYRINGE | Freq: Once | INTRAMUSCULAR | Status: DC

## 2023-07-01 MED ORDER — IBUPROFEN 600 MG PO TABS
600.0000 mg | ORAL_TABLET | Freq: Four times a day (QID) | ORAL | Status: DC
  Administered 2023-07-01 – 2023-07-02 (×4): 600 mg via ORAL
  Filled 2023-07-01 (×4): qty 1

## 2023-07-01 MED ORDER — COCONUT OIL OIL: 1.0000 | TOPICAL_OIL | Status: DC | PRN

## 2023-07-01 MED ORDER — LIDOCAINE HCL (PF) 1 % IJ SOLN: 30.0000 mL | INTRAMUSCULAR | Status: DC | PRN

## 2023-07-01 MED ORDER — OXYTOCIN-SODIUM CHLORIDE 30-0.9 UT/500ML-% IV SOLN
2.5000 [IU]/h | INTRAVENOUS | Status: DC
  Filled 2023-07-01: qty 500

## 2023-07-01 MED ORDER — FENTANYL-BUPIVACAINE-NACL 0.5-0.125-0.9 MG/250ML-% EP SOLN
EPIDURAL | Status: DC | PRN
  Administered 2023-07-01: 12 mL/h via EPIDURAL

## 2023-07-01 MED ORDER — ACETAMINOPHEN 325 MG PO TABS: 650.0000 mg | ORAL_TABLET | ORAL | Status: DC | PRN

## 2023-07-01 MED ORDER — OXYCODONE HCL 5 MG PO TABS: 10.0000 mg | ORAL_TABLET | ORAL | Status: DC | PRN

## 2023-07-01 NOTE — H&P (Addendum)
OBSTETRIC ADMISSION HISTORY AND PHYSICAL  Donzella Hemmert is a 27 y.o. female [phone] with IUP at [redacted]w[redacted]d by LMP presenting for IOL 2/2 IUGR. She reports +FMs, No LOF, no VB, no blurry vision, headaches or peripheral edema, and RUQ pain.  She plans on breast feeding. She request condoms for birth control.  She received her prenatal care at [facility].  Dating: By LMP --->  Estimated Date of Delivery: 07/22/23  Sono:   @[redacted]w[redacted]d , CWD, normal anatomy, cephalic presentation, 2444 g, 7% EFW  Prenatal History/Complications: IUGR History of preterm delivery Anemia  Past Medical History: Past Medical History:  Diagnosis Date   Dysmenorrhea 01/02/2017   Family history of endometriosis in first degree relative 01/02/2017   Family history of first-degree relative with cardiomyopathy 04/16/2016   Heavy periods    History of heavy periods 01/02/2017   Painful menstrual periods    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   Premature delivery before 37 weeks     Past Surgical History: Past Surgical History:  Procedure Laterality Date   NO PAST SURGERIES      Obstetrical History: OB History     Gravida  4   Para  3   Term  0   Preterm  3   AB  0   Living  3      SAB  0   IAB  0   Ectopic  0   Multiple  0   Live Births  3           Social History Social History   Socioeconomic History   Marital status: Married    Spouse name: Scientific laboratory technician   Number of children: Not on file   Years of education: Not on file   Highest education level: Not on file  Occupational History   Not on file  Tobacco Use   Smoking status: Never   Smokeless tobacco: Never  Vaping Use   Vaping status: Never Used  Substance and Sexual Activity   Alcohol use: No   Drug use: No   Sexual activity: Yes    Birth control/protection: None    Comment: undecided  Other Topics Concern   Not on file  Social History Narrative   Not on file   Social Determinants of Health   Financial Resource Strain:  Not on file  Food Insecurity: Not on file  Transportation Needs: Not on file  Physical Activity: Not on file  Stress: Not on file  Social Connections: Not on file    Family History: Family History  Problem Relation Age of Onset   Heart disease Father        genetic cardiomyopathy   Endometriosis Sister    Cancer Maternal Grandmother        pancreatic   Endometriosis Maternal Grandmother        grt   Breast cancer Neg Hx    Ovarian cancer Neg Hx    Colon cancer Neg Hx    Diabetes Neg Hx     Allergies: No Known Allergies  Medications Prior to Admission  Medication Sig Dispense Refill Last Dose   ferrous sulfate 325 (65 FE) MG tablet Take 1 tablet (325 mg total) by mouth every other day. 90 tablet 1    Prenatal Vit-Fe Sulfate-FA-DHA (PRENATAL VITAMIN/MIN +DHA) 27-0.8-200 MG CAPS Take 1 tablet by mouth daily. 30 capsule 11      Review of Systems   All systems reviewed and negative except as stated in HPI  Last menstrual period 10/15/2022,  currently breastfeeding. General appearance: alert and no distress Lungs: clear to auscultation bilaterally Heart: regular rate and rhythm Abdomen: soft, non-tender; bowel sounds normal Pelvic: 5 / 50 / -2 Extremities: Homans sign is negative, no sign of DVT Presentation: cephalic Fetal monitoringBaseline: 130 bpm, Variability: moderate, Accelerations: present, and Decelerations: none Uterine activity Date/time of onset: 8/14, Frequency: Every 3-5 minutes, Duration: 30 seconds, and Intensity: mild     Prenatal labs: ABO, Rh: A/Positive/-- (02/16 1032) Antibody: Negative (02/16 1032) Rubella: 1.03 (02/16 1032) RPR: Non Reactive (06/20 0917)  HBsAg: Negative (02/16 1032)  HIV: Non Reactive (06/20 0917)  GBS: Negative/-- (08/14 0903)  1 hr Glucola: 94 (negative) Genetic screening: N/A Anatomy US: normal female anatomy  Prenatal Transfer Tool  Maternal Diabetes: No Genetic Screening: Declined Maternal Ultrasounds/Referrals:  IUGR Fetal Ultrasounds or other Referrals:  None Maternal Substance Abuse:  No Significant Maternal Medications:  None Significant Maternal Lab Results:  Group B Strep negative Number of Prenatal Visits:greater than 3 verified prenatal visits Other Comments:  None  No results found for this or any previous visit (from the past 24 hour(s)).  Patient Active Problem List   Diagnosis Date Noted   Anemia 05/01/2023   IUGR (intrauterine growth restriction) affecting care of mother 04/27/2023   Supervision of high risk pregnancy, antepartum, third trimester 12/17/2022   History of preterm delivery 06/17/2018    Assessment/Plan:  Calypso Casasanta is a 27 y.o. [phone] at [redacted]w[redacted]d here for IOL 2/2 IUGR.  #Labor: 2x2 Pitocin, offered AROM prefers pitocin first #Pain: Prefers no epidural for now #FWB: Cat I #ID:  GBS- #MOF: breast #MOC: condoms #Circ:  no  Hanley Ben, Medical Student  07/01/2023, 12:04 AM  GME ATTESTATION:  Evaluation and management procedures were performed by the Medical Student under my supervision. I was immediately available for direct supervision, assistance and direction throughout this encounter.  I also confirm that I have verified the information documented in the resident's note, and that I have also personally reperformed the pertinent components of the physical exam and all of the medical decision making activities.  I have also made any necessary editorial changes.  Wyn Forster, MD OB Fellow, Faculty Practice [facility], Center for [facility] 07/01/2023 2:58 AM

## 2023-07-01 NOTE — Anesthesia Procedure Notes (Signed)
Epidural Patient location during procedure: OB Start time: 07/01/2023 6:19 AM End time: 07/01/2023 6:29 AM  Staffing Anesthesiologist: Trevor Iha, MD Performed: anesthesiologist   Preanesthetic Checklist Completed: patient identified, IV checked, site marked, risks and benefits discussed, surgical consent, monitors and equipment checked, pre-op evaluation and timeout performed  Epidural Patient position: sitting Prep: DuraPrep and site prepped and draped Patient monitoring: continuous pulse ox and blood pressure Approach: midline Location: L3-L4 Injection technique: LOR air  Needle:  Needle type: Tuohy  Needle gauge: 17 G Needle length: 9 cm and 9 Needle insertion depth: 7 cm Catheter type: closed end flexible Catheter size: 19 Gauge Catheter at skin depth: 12 cm Test dose: negative  Assessment Events: blood not aspirated, no cerebrospinal fluid, injection not painful, no injection resistance, no paresthesia and negative IV test  Additional Notes Patient identified. Risks/Benefits/Options discussed with patient including but not limited to bleeding, infection, nerve damage, paralysis, failed block, incomplete pain control, headache, blood pressure changes, nausea, vomiting, reactions to medication both or allergic, itching and postpartum back pain. Confirmed with bedside nurse the patient's most recent platelet count. Confirmed with patient that they are not currently taking any anticoagulation, have any bleeding history or any family history of bleeding disorders. Patient expressed understanding and wished to proceed. All questions were answered. Sterile technique was used throughout the entire procedure. Please see nursing notes for vital signs. Test dose was given through epidural needle and negative prior to continuing to dose epidural or start infusion. Warning signs of high block given to the patient including shortness of breath, tingling/numbness in hands, complete  motor block, or any concerning symptoms with instructions to call for help. Patient was given instructions on fall risk and not to get out of bed. All questions and concerns addressed with instructions to call with any issues.  1 Attempt (S) . Patient tolerated procedure well.

## 2023-07-01 NOTE — Anesthesia Preprocedure Evaluation (Addendum)
Anesthesia Evaluation  Patient identified by MRN, date of birth, ID band Patient awake    Reviewed: Allergy & Precautions, NPO status , Patient's Chart, lab work & pertinent test results  Airway Mallampati: II  TM Distance: >3 FB Neck ROM: Full    Dental no notable dental hx. (+) Teeth Intact, Dental Advisory Given   Pulmonary neg pulmonary ROS   Pulmonary exam normal breath sounds clear to auscultation       Cardiovascular negative cardio ROS Normal cardiovascular exam Rhythm:Regular Rate:Normal     Neuro/Psych   Anxiety     negative neurological ROS     GI/Hepatic negative GI ROS, Neg liver ROS,,,  Endo/Other  negative endocrine ROS    Renal/GU negative Renal ROS     Musculoskeletal   Abdominal   Peds  Hematology  (+) Blood dyscrasia, anemia Lab Results      Component                Value               Date                      WBC                      14.0 (H)            07/01/2023                HGB                      9.7 (L)             07/01/2023                HCT                      30.5 (L)            07/01/2023                MCV                      83.3                07/01/2023                PLT                      356                 07/01/2023              Anesthesia Other Findings   Reproductive/Obstetrics (+) Pregnancy                             Anesthesia Physical Anesthesia Plan  ASA: 2  Anesthesia Plan: Epidural   Post-op Pain Management:    Induction:   PONV Risk Score and Plan:   Airway Management Planned:   Additional Equipment:   Intra-op Plan:   Post-operative Plan:   Informed Consent: I have reviewed the patients History and Physical, chart, labs and discussed the procedure including the risks, benefits and alternatives for the proposed anesthesia with the patient or authorized representative who has indicated his/her understanding and  acceptance.       Plan Discussed with:  Anesthesia Plan Comments: (36.5 Wk G4P3 )       Anesthesia Quick Evaluation

## 2023-07-01 NOTE — Progress Notes (Signed)
POSTPARTUM PROGRESS NOTE  Post Partum Day 1  Subjective:  Bonnie Graham is a 27 y.o. [phone] s/p SVD at [redacted]w[redacted]d.  She reports she is doing well. No acute events overnight. She denies any problems with ambulating, voiding or po intake. Denies nausea or vomiting.  Pain is {DESC; WELL/MODERATELY/POORLY:30679} controlled.  Lochia is ***.  Objective: Blood pressure 112/85, pulse 77, temperature 98.1 F (36.7 C), temperature source Oral, resp. rate 17, height 5\' 3"  (1.6 m), weight 84.2 kg, last menstrual period 10/15/2022, SpO2 97%, unknown if currently breastfeeding.  Physical Exam:  General: alert, cooperative and no distress Chest: no respiratory distress Heart:regular rate, distal pulses intact Uterine Fundus: firm, appropriately tender DVT Evaluation: No calf swelling or tenderness Extremities: *** edema Skin: warm, dry  Recent Labs    07/01/23 0033  HGB 9.7*  HCT 30.5*    Assessment/Plan: Bonnie Graham is a 27 y.o. [phone] s/p SVD at [redacted]w[redacted]d   PPD#1 - Doing well  Routine postpartum care  Anemia: HgB 9/7 on admit  started Fe Contraception: condoms Feeding: breast Dispo: Plan for discharge 8/23.   LOS: 0 days   Sundra Aland, MD OB Fellow  07/01/2023, 8:56 PM

## 2023-07-01 NOTE — Progress Notes (Signed)
Patient ID: Bonnie Graham, female   DOB: 12-Feb-1996, 27 y.o.   MRN: [id number] Doing well On hands and knees, breathing through contractions  Vitals:   07/01/23 0017 07/01/23 0034 07/01/23 0045 07/01/23 0208  BP: 121/64   (!) 96/53  Pulse: (!) 104   84  Resp:   20   Temp:   98 F (36.7 C)   TempSrc:   Oral   Weight:  84.2 kg    Height:  5\' 3"  (1.6 m)     FHR stable, Moderate/Faif variability +accels No decels UCs every 4-61min  Cervix deferred  Discussed birth plans Informed patient I did not feel that it was safe to do a waterbirth given IUGR and need for continuous EFM for that as well as Pitocin surveillance  DIscussed we may be able to do short showers for hydrotherapy for 5-70min off monitor if baby HR remains reassuring  DIscussed my shift is over at 8 and next CNM comes in at 10.  Will continue to observe

## 2023-07-01 NOTE — Discharge Summary (Signed)
Postpartum Discharge Summary  Date of Service updated***     Patient Name: Bonnie Graham DOB: 1995/11/15 MRN: [id number]  Date of admission: 07/01/2023 Delivery date:07/01/2023 Delivering provider:   Date of discharge: 07/01/2023  Admitting diagnosis: Encounter for induction of labor [Z34.90] Intrauterine pregnancy: [redacted]w[redacted]d     Secondary diagnosis:  Principal Problem:   Encounter for induction of labor  Additional problems: ***    Discharge diagnosis: Term Pregnancy Delivered and IUGR                                               Post partum procedures:{Postpartum procedures:23558} Augmentation: AROM and Pitocin Complications: None  Hospital course: Induction of Labor With Vaginal Delivery   27 y.o. yo [phone] at [redacted]w[redacted]d was admitted to the hospital 07/01/2023 for induction of labor.  Indication for induction:  IUGR .  Patient had an labor course uncomplicated  Membrane Rupture Time/Date: 10:43 AM,07/01/2023  Delivery Method:Vaginal, Spontaneous Operative Delivery:N/A Episiotomy: None Lacerations:   perineal abrasion, not repaired.  Details of delivery can be found in separate delivery note.  Patient had a postpartum course complicated by***. Patient is discharged home 07/01/23.  Newborn Data: Birth date:07/01/2023 Birth time:12:07 PM Gender:Female Living status:  Apgars: ,  Weight:   Magnesium Sulfate received: No BMZ received: No Rhophylac:N/A MMR:N/A T-DaP: Declined  Flu: N/A Transfusion:No  Physical exam  Vitals:   07/01/23 1031 07/01/23 1100 07/01/23 1120 07/01/23 1131  BP: 111/66 107/69  110/67  Pulse: 84 84  79  Resp:      Temp:   97.8 F (36.6 C)   TempSrc:      SpO2:      Weight:      Height:       General: alert, cooperative, and no distress Lochia: {Desc; appropriate/inappropriate:30686::"appropriate"} Uterine Fundus: {Desc; firm/soft:30687} Incision: {Exam; incision:21111123} DVT Evaluation: {Exam; dvt:2111122} Labs: Lab Results  Component  Value Date   WBC 14.0 (H) 07/01/2023   HGB 9.7 (L) 07/01/2023   HCT 30.5 (L) 07/01/2023   MCV 83.3 07/01/2023   PLT 356 07/01/2023      Latest Ref Rng & Units 12/06/2020   11:04 PM  CMP  Total Protein 6.5 - 8.1 g/dL 7.9   Total Bilirubin 0.3 - 1.2 mg/dL 0.6   Alkaline Phos 38 - 126 U/L 71   AST 15 - 41 U/L 19   ALT 0 - 44 U/L 17    Edinburgh Score:    10/30/2021   10:49 AM  Edinburgh Postnatal Depression Scale Screening Tool  I have been able to laugh and see the funny side of things. 0  I have looked forward with enjoyment to things. 0  I have blamed myself unnecessarily when things went wrong. 1  I have been anxious or worried for no good reason. 1  I have felt scared or panicky for no good reason. 1  Things have been getting on top of me. 2  I have been so unhappy that I have had difficulty sleeping. 0  I have felt sad or miserable. 0  I have been so unhappy that I have been crying. 0  The thought of harming myself has occurred to me. 0  Edinburgh Postnatal Depression Scale Total 5     After visit meds:  Allergies as of 07/01/2023   No Known Allergies   Med  Rec must be completed prior to using this [facility]***        Discharge home in stable condition Infant Feeding: {Baby feeding:23562} Infant Disposition:{CHL IP OB HOME WITH EAVWUJ:81191} Discharge instruction: per After Visit Summary and Postpartum booklet. Activity: Advance as tolerated. Pelvic rest for 6 weeks.  Diet: {OB YNWG:95621308} Future Appointments: Future Appointments  Date Time Provider Department Center  07/08/2023  8:10 AM Milas Hock, MD [facility]  07/15/2023 10:50 AM Milas Hock, MD [facility]   Follow up Visit:   Please schedule this patient for a Virtual postpartum visit in 6 weeks with the following provider: Any provider. Additional Postpartum F/U: N/A    High risk pregnancy complicated by:  IUGR Delivery mode:  Vaginal, Spontaneous Anticipated Birth  Control:  Condoms  Message sent on 8/21 @ 12:30 by S. Suzie Portela CNM \   07/01/2023 Claudette Head, CNM

## 2023-07-01 NOTE — Progress Notes (Signed)
Bonnie Graham is a 27 y.o. [phone] at [redacted]w[redacted]d by LMP admitted for induction of labor due to Poor fetal growth.  Subjective: Patient doing well. Comfortable with epidural. Introductions exchanged.   Objective: BP 111/74   Pulse 71   Temp 98 F (36.7 C) (Oral)   Resp 20   Ht 5\' 3"  (1.6 m)   Wt 84.2 kg   LMP 10/15/2022   SpO2 99%   BMI 32.88 kg/m  No intake/output data recorded. No intake/output data recorded.  FHT:  FHR: 135 bpm, variability: moderate,  accelerations:  Present,  decelerations:  Absent UC:   irregular, every 5-7 minutes SVE:   Dilation: 5.5 Effacement (%): 70, 80 Station: -1 Exam by:: [employer] CNM  Labs: Lab Results  Component Value Date   WBC 14.0 (H) 07/01/2023   HGB 9.7 (L) 07/01/2023   HCT 30.5 (L) 07/01/2023   MCV 83.3 07/01/2023   PLT 356 07/01/2023   CNM to patient bedside to discuss AROM. Reviewed risks and benefits with Patient and FOB. All questions answered at bedside. Pt agrees to proceed with AROM. FHT Cat I prior to AROM. 5.5/70/-1 S. Suzie Portela CNM. AROM successful with 1 pass of the amnihook. Clear fluid noted. Fetal head well applied to cervix. FHT remained Cat I following AROM.   Assessment / Plan: Induction of labor due to IUGR,  progressing well on pitocin s/p AROM.   Labor:  Continue to titrate Pit up 2x2 as needed  Fetal Wellbeing:  Category I- continuous monitoring while on epidural and pitocin.  Pain Control:  Epidural I/D:   GBS negative  Anticipated MOD:  NSVD  Claudette Head, CNM 07/01/2023, 10:32 AM

## 2023-07-02 MED ORDER — ACETAMINOPHEN 325 MG PO TABS: 650.0000 mg | ORAL_TABLET | ORAL | 1 refills | Status: AC | PRN

## 2023-07-02 MED ORDER — IBUPROFEN 600 MG PO TABS: 600.0000 mg | ORAL_TABLET | Freq: Four times a day (QID) | ORAL | 1 refills | Status: AC

## 2023-07-02 MED ORDER — SENNOSIDES-DOCUSATE SODIUM 8.6-50 MG PO TABS: 2.0000 | ORAL_TABLET | Freq: Every day | ORAL | 1 refills | Status: AC

## 2023-07-02 NOTE — Lactation Note (Signed)
This note was copied from a baby's chart. Lactation Consultation Note Experienced BF mom has been BF for past 5 yrs almost constantly mom stated. Mom stated she BF until her has another baby. Just stopped BF her now 25 yr old 2 months ago. Mom put baby on the breast and he was gulping at the breast. Discussed w/mom pumping d/t less than 6 lbs mom stated she will just use her Haaka. LC explained Haaka isn't a pump that's a milk collector. Offered to set up DEBP for mom. Mom declined for now.  Mom wishes to exclusively BF baby. Gave mom LPI information sheet. Reviewed not to BF longer than 20 minutes to conserve calories. Mom stated OK. Newborn feeding habits, behavior, STS, I&O reviewed. This is mom's 4th Bonnie. She feels good about BF and feels like the baby is doing well. Encouraged to call for assistance if needed.  Patient Name: Bonnie Graham [id number] Date: 07/02/2023 Age:67 hours Reason for consult: Initial assessment;Early term 37-38.6wks;Infant < 6lbs   Maternal Data Does the patient have breastfeeding experience prior to this delivery?: Yes How long did the patient breastfeed?: BF's until she has another baby. mom stated she has been BF almost constantly for the past 5 yrs. her oldest is 5. mom just stopped BF her 25 yr old 2 months ago.  Feeding    LATCH Score Latch: Grasps breast easily, tongue down, lips flanged, rhythmical sucking.  Audible Swallowing: Spontaneous and intermittent  Type of Nipple: Everted at rest and after stimulation  Comfort (Breast/Nipple): Soft / non-tender  Hold (Positioning): No assistance needed to correctly position infant at breast.  LATCH Score: 10   Lactation Tools Discussed/Used    Interventions Interventions: Breast feeding basics reviewed;LC Services brochure;LPT handout/interventions  Discharge    Consult Status Consult Status: Follow-up Date: 07/03/23 Follow-up type: In-patient    Akari C[employer]ler, Diamond Nickel 07/02/2023, 2:15 AM

## 2023-07-02 NOTE — Lactation Note (Signed)
This note was copied from a baby's chart. Lactation Consultation Note  Patient Name: Bonnie Graham [id number] Date: 07/02/2023 Age:25 hours  Reason for consult: Follow-up assessment;Infant < 6lbs;Early term 37-38.6wks;MD order  P4, [redacted]w[redacted]d, 5 lbs 2.9 oz (today), 6% weight loss  Mother states she has been lactating/ breastfeeding for almost 5 years due to the spacing of her children. She has a history of having a good milk supply. Mother pumped  2-3 oz of colostrum over the last week  and has it stored at home. Advised to feed to baby when they are discharged.  Observed mother latch baby "[name]" independently. He had a wide gape, deep latch and audible swallows. Advised to do breast compression to aid in milk transfer during the feeding.  Mom made aware of O/P services, breastfeeding support groups, and our phone # for post-discharge questions.     Feeding Mother's Current Feeding Choice: Breast Milk  LATCH Score Latch: Grasps breast easily, tongue down, lips flanged, rhythmical sucking.  Audible Swallowing: Spontaneous and intermittent  Type of Nipple: Everted at rest and after stimulation  Comfort (Breast/Nipple): Soft / non-tender  Hold (Positioning): No assistance needed to correctly position infant at breast.  LATCH Score: 10   Lactation Tools Discussed/Used    Interventions Interventions: Breast compression;Education  Discharge Discharge Education: Warning signs for feeding baby;Engorgement and breast care Pump: Personal  Consult Status Consult Status: Complete Date: 07/02/23    Omar Person 07/02/2023, 3:44 PM

## 2023-07-02 NOTE — Progress Notes (Signed)
CSW received a consult for postpartum anxiety; and met MOB at bedside to offer support and to complete an assessment. CSW entered the room, introduced herself and acknowledged that FOB was present. MOB gave CSW verbal permission to speak about about anything while FOB was present. CSW explained her role and the reason for the visit. MOB presented as calm, was agreeable to consult and remained engaged throughout encounter.  CSW inquired about MOB's mental health history. MOB reported experiencing PP anxiety with her first pregnancy 5 years ago with symptoms that included anxious, crying and sadness. MOB reported reaching out for support 4 months later and was prescribed medication for support. MOB reported not remaining on the medication for long and discontinued use a few months later. MOB denied experiencing any mental health prior to her pregnancies. MOB reported currently feeling "good" and bonded well with the infant. CSW provided education regarding the baby blues period vs. perinatal mood disorders, discussed treatment and gave resources for mental health follow up if concerns arise.  CSW recommends self-evaluation during the postpartum time period using the New Mom Checklist from Postpartum Progress and encouraged MOB to contact a medical professional if symptoms are noted at any time. CSW assessed for safety with MOB SI and HI; MOB denied all. CSW did not assess for DV; FOB was present.  CSW asked MOB has she selected a pediatrician for the infant's follow up visits; MOB said [facility] - [place]. MOB reported having all essential items for the infant including a carseat, bassinet and crib for safe sleeping. CSW provided review of Sudden Infant Death Syndrome (SIDS) precautions.  CSW identifies no further need for intervention and no barriers to discharge at this time.   Enos Fling, Theresia Majors Clinical Social Worker [phone]

## 2023-07-02 NOTE — Anesthesia Postprocedure Evaluation (Signed)
Anesthesia Post Note  Patient: Bonnie Graham  Procedure(s) Performed: AN AD HOC LABOR EPIDURAL     Patient location during evaluation: Mother Baby Anesthesia Type: Epidural Level of consciousness: awake, oriented and awake and alert Pain management: pain level controlled Vital Signs Assessment: post-procedure vital signs reviewed and stable Respiratory status: spontaneous breathing, respiratory function stable and nonlabored ventilation Cardiovascular status: stable Postop Assessment: no headache, adequate PO intake, able to ambulate, patient able to bend at knees and no apparent nausea or vomiting Anesthetic complications: no   No notable events documented.  Last Vitals:  Vitals:   07/01/23 2105 07/02/23 0345  BP: 108/73 98/66  Pulse: 65 66  Resp: 18 18  Temp: 36.5 C 36.5 C  SpO2: 97% 98%    Last Pain:  Vitals:   07/02/23 0728  TempSrc:   PainSc: 0-No pain   Pain Goal:                   [name]

## 2023-07-07 ENCOUNTER — Ambulatory Visit (HOSPITAL_COMMUNITY): Payer: Self-pay

## 2023-07-07 NOTE — Lactation Note (Signed)
This note was copied from a baby's chart. Lactation Consultation Note  Patient Name: Bonnie Graham [id number] Date: 07/07/2023 Age:25 days  Reason for consult: Initial assessment;Early term 37-38.6wks;MD order  P4, Peds referral, early term   Pediatric referral to see P4 mother of 68 day old infant admitted due to BRUE  that occurred at home when infant was breastfeeding. Infant has had bradycardia episodes associated with breastfeeding while in the hospital. [facility] was able to huddle with Pediatricians, SLP, and RN regarding feeding plan for baby.   Current feeding plan is for mother to pre-pump prior to infant breastfeeding to reduce rapid milk flow. Mother can then latch "[name]"  and breastfeed as tolerates. Baby is on a heart monitor and parents have experience with first baby being born at 22 wks and in the NICU.   SLP has evaluated baby's feeding and would like mother to give expressed milk by Dr. Theora Gianotti bottle provided for at least 2 feedings per day.   Mother will support and maintain her milk supply by pumping or using her Haaka (mother's preferred method)  brought from home to remove milk from other breast. Mother is currently post pumping the breast baby is not feeding from and is collecting 25 oz per day.   Mother is agreeable to the above feeding plan.  Plan Pre-pump to decrease forceful milk let down and save Breastfeed as baby tolerates Mother will pump to soften breast and maintain milk supply Infant to be fed mother's expressed milk by Dr. Theora Gianotti bottle with paced feeding    Maternal Data Does the patient have breastfeeding experience prior to this delivery?: Yes How long did the patient breastfeed?: P4- breastfeeding for the last 5 years (breaks in breastfeeding due to pregnancies)  Feeding Mother's Current Feeding Choice: Breast Milk  LATCH Score  Not observed   Interventions Interventions: Education  Discharge Pump: DEBP;Personal (haaka)  Consult  Status Consult Status: PRN Follow-up type: In-patient    Christella Hartigan M 07/07/2023, 1:24 PM

## 2023-07-08 ENCOUNTER — Encounter: Payer: Medicaid Other | Admitting: Obstetrics and Gynecology

## 2023-07-08 ENCOUNTER — Ambulatory Visit: Payer: Medicaid Other

## 2023-07-15 ENCOUNTER — Encounter: Payer: Medicaid Other | Admitting: Obstetrics and Gynecology

## 2023-07-30 ENCOUNTER — Telehealth (HOSPITAL_COMMUNITY): Payer: Self-pay | Admitting: *Deleted

## 2023-07-30 NOTE — Telephone Encounter (Signed)
07/30/2023  Name: Kyjah Schlicher MRN: [id number] DOB: 04/14/1996  Reason for Call:  Transition of Care Hospital Discharge Call  Contact Status: Patient Contact Status: Message  Language assistant needed:          Follow-Up Questions:    Inocente Salles Postnatal Depression Scale:  In the Past 7 Days:    PHQ2-9 Depression Scale:     Discharge Follow-up:    Post-discharge interventions: NA  Salena Saner, RN 07/30/2023 12:16

## 2023-08-07 ENCOUNTER — Encounter: Payer: Self-pay | Admitting: Certified Nurse Midwife

## 2023-08-10 MED ORDER — MICONAZOLE NITRATE 2 % EX CREA: 1.0000 | TOPICAL_CREAM | Freq: Two times a day (BID) | CUTANEOUS | 0 refills | Status: AC

## 2023-08-12 ENCOUNTER — Telehealth (INDEPENDENT_AMBULATORY_CARE_PROVIDER_SITE_OTHER): Payer: Medicaid Other | Admitting: Obstetrics and Gynecology

## 2023-08-12 ENCOUNTER — Encounter: Payer: Self-pay | Admitting: Obstetrics and Gynecology

## 2023-08-12 DIAGNOSIS — Z8759 Personal history of other complications of pregnancy, childbirth and the puerperium: Secondary | ICD-10-CM | POA: Diagnosis not present

## 2023-08-12 NOTE — Progress Notes (Signed)
Provider location: [facility] for [facility] [place]   Patient location: Home  I connected withNAME@ on 08/12/23 at  8:30 AM EDT by Mychart Video Encounter and verified that I am speaking with the correct person using two identifiers.       I discussed the limitations, risks, security and privacy concerns of performing an evaluation and management service virtually and the availability of in person appointments. I also discussed with the patient that there may be a patient responsible charge related to this service. The patient expressed understanding and agreed to proceed.  Post Partum Visit Note Subjective:   Bonnie Graham is a 27 y.o. [phone] female who presents for a postpartum visit. She is 6 weeks postpartum following a normal spontaneous vaginal delivery.  I have fully reviewed the prenatal and intrapartum course. The delivery was at 37 gestational weeks.  Anesthesia: epidural. Postpartum course has been unremarkable. Baby is doing well. Baby is feeding by both breast and bottle - Enfamil AR. Bleeding: no bleeding. Bowel function is normal. Bladder function is normal. Patient is not sexually active. Contraception method is condoms. Postpartum depression screening: negative.   The pregnancy intention screening data noted above was reviewed. Potential methods of contraception were discussed. The patient elected to proceed with No data recorded.   Edinburgh Postnatal Depression Scale - 08/12/23 0804       Edinburgh Postnatal Depression Scale:  In the Past 7 Days   I have been able to laugh and see the funny side of things. 0    I have looked forward with enjoyment to things. 0    I have blamed myself unnecessarily when things went wrong. 1    I have been anxious or worried for no good reason. 1    I have felt scared or panicky for no good reason. 1    Things have been getting on top of me. 0    I have been so unhappy that I have had difficulty sleeping. 0    I have  felt sad or miserable. 0    I have been so unhappy that I have been crying. 0    The thought of harming myself has occurred to me. 0    Edinburgh Postnatal Depression Scale Total 3             The following portions of the patient's history were reviewed and updated as appropriate: allergies, current medications, past family history, past medical history, past social history, past surgical history, and problem list.  Review of Systems Pertinent items are noted in HPI.  Objective:  LMP 10/15/2022     General:  Alert, oriented and cooperative. Patient is in no acute distress.  Respiratory: Normal respiratory effort, no problems with respiration noted  Mental Status: Normal mood and affect. Normal behavior. Normal judgment and thought content.  Rest of physical exam deferred due to type of encounter   Assessment:    6 week postpartum exam.  Plan:  Essential components of care per ACOG recommendations:  1.  Mood and well being: Patient with negative depression screening today. Reviewed local resources for support.  - Patient does not use tobacco.  - hx of drug use? No    2. Infant care and feeding:  -Patient currently breastmilk feeding? Yes (and formula supplementation) Reviewed importance of draining breast regularly to support lactation. -Social determinants of health (SDOH) reviewed in EPIC. No concerns  3. Sexuality, contraception and birth spacing - Patient does not want a  pregnancy in the next year.  Desired family size is 4 children.  - Reviewed forms of contraception in tiered fashion. Patient desired condoms today.   - Discussed birth spacing of 18 months  4. Sleep and fatigue -Encouraged family/partner/community support of 4 hrs of uninterrupted sleep to help with mood and fatigue  5. Physical Recovery  - Discussed patients delivery and complications. Had IOL for FGR. Overall positive experience but took longer than she expected.  - Patient had no laceration.  -  Patient has urinary incontinence? No  - Patient is safe to resume physical and sexual activity  6.  Health Maintenance - Last pap smear done 03/2021 and was normal.   7. Chronic Disease - None  I provided 4 minutes of face-to-face time during this encounter.    Return in about 8 months (around 04/11/2024) for annual.  No future appointments.   Milas Hock, MD Center for [facility], [facility]

## 2024-05-18 ENCOUNTER — Ambulatory Visit: Admitting: Obstetrics and Gynecology
